# Patient Record
Sex: Female | Born: 1975 | Race: Black or African American | Hispanic: No | Smoking: Former smoker
Health system: Southern US, Community
[De-identification: ages and names within clinical notes are randomized; demographics above are authoritative.]

## PROBLEM LIST (undated history)

## (undated) DIAGNOSIS — K219 Gastro-esophageal reflux disease without esophagitis: Secondary | ICD-10-CM

## (undated) DIAGNOSIS — Z9889 Other specified postprocedural states: Secondary | ICD-10-CM

## (undated) DIAGNOSIS — A048 Other specified bacterial intestinal infections: Secondary | ICD-10-CM

## (undated) DIAGNOSIS — M359 Systemic involvement of connective tissue, unspecified: Secondary | ICD-10-CM

## (undated) DIAGNOSIS — IMO0002 Reserved for concepts with insufficient information to code with codable children: Secondary | ICD-10-CM

## (undated) DIAGNOSIS — M329 Systemic lupus erythematosus, unspecified: Secondary | ICD-10-CM

## (undated) DIAGNOSIS — E2839 Other primary ovarian failure: Secondary | ICD-10-CM

## (undated) HISTORY — DX: Other specified postprocedural states: Z98.890

## (undated) HISTORY — PX: CHOLECYSTECTOMY: SHX55

## (undated) HISTORY — DX: Other specified bacterial intestinal infections: A04.8

## (undated) HISTORY — PX: APPENDECTOMY: SHX54

---

## 2004-12-20 DIAGNOSIS — Z9889 Other specified postprocedural states: Secondary | ICD-10-CM

## 2004-12-20 HISTORY — DX: Other specified postprocedural states: Z98.890

## 2007-01-03 ENCOUNTER — Emergency Department (HOSPITAL_COMMUNITY): Admission: EM | Admit: 2007-01-03 | Discharge: 2007-01-04 | Payer: Self-pay | Admitting: Emergency Medicine

## 2007-12-01 ENCOUNTER — Emergency Department (HOSPITAL_COMMUNITY): Admission: EM | Admit: 2007-12-01 | Discharge: 2007-12-01 | Payer: Self-pay | Admitting: *Deleted

## 2008-10-13 ENCOUNTER — Emergency Department (HOSPITAL_COMMUNITY): Admission: EM | Admit: 2008-10-13 | Discharge: 2008-10-13 | Payer: Self-pay | Admitting: Family Medicine

## 2008-12-20 HISTORY — PX: OVARIAN CYST REMOVAL: SHX89

## 2009-01-27 ENCOUNTER — Ambulatory Visit (HOSPITAL_COMMUNITY): Admission: RE | Admit: 2009-01-27 | Discharge: 2009-01-27 | Payer: Self-pay | Admitting: Obstetrics and Gynecology

## 2009-12-29 ENCOUNTER — Emergency Department (HOSPITAL_COMMUNITY): Admission: EM | Admit: 2009-12-29 | Discharge: 2009-12-29 | Payer: Self-pay | Admitting: Emergency Medicine

## 2010-07-23 ENCOUNTER — Emergency Department (HOSPITAL_COMMUNITY): Admission: EM | Admit: 2010-07-23 | Discharge: 2010-07-23 | Payer: Self-pay | Admitting: Emergency Medicine

## 2010-07-25 ENCOUNTER — Emergency Department (HOSPITAL_COMMUNITY): Admission: EM | Admit: 2010-07-25 | Discharge: 2010-07-25 | Payer: Self-pay | Admitting: Emergency Medicine

## 2010-12-21 ENCOUNTER — Emergency Department (HOSPITAL_COMMUNITY)
Admission: EM | Admit: 2010-12-21 | Discharge: 2010-12-21 | Payer: Self-pay | Source: Home / Self Care | Admitting: Emergency Medicine

## 2011-03-13 ENCOUNTER — Emergency Department (HOSPITAL_COMMUNITY)
Admission: EM | Admit: 2011-03-13 | Discharge: 2011-03-13 | Disposition: A | Discharge status: Home / Self Care | Payer: BC Managed Care – PPO | Attending: Emergency Medicine | Admitting: Emergency Medicine

## 2011-03-13 DIAGNOSIS — B029 Zoster without complications: Secondary | ICD-10-CM | POA: Insufficient documentation

## 2011-03-13 DIAGNOSIS — R21 Rash and other nonspecific skin eruption: Secondary | ICD-10-CM | POA: Insufficient documentation

## 2011-03-14 ENCOUNTER — Emergency Department (HOSPITAL_COMMUNITY)
Admission: EM | Admit: 2011-03-14 | Discharge: 2011-03-14 | Disposition: A | Discharge status: Home / Self Care | Payer: BC Managed Care – PPO | Attending: Emergency Medicine | Admitting: Emergency Medicine

## 2011-03-14 DIAGNOSIS — B029 Zoster without complications: Secondary | ICD-10-CM | POA: Insufficient documentation

## 2011-03-15 ENCOUNTER — Emergency Department (HOSPITAL_COMMUNITY)
Admission: EM | Admit: 2011-03-15 | Discharge: 2011-03-15 | Disposition: A | Discharge status: Home / Self Care | Payer: BC Managed Care – PPO | Attending: Emergency Medicine | Admitting: Emergency Medicine

## 2011-03-15 DIAGNOSIS — N898 Other specified noninflammatory disorders of vagina: Secondary | ICD-10-CM | POA: Insufficient documentation

## 2011-03-15 DIAGNOSIS — B9689 Other specified bacterial agents as the cause of diseases classified elsewhere: Secondary | ICD-10-CM | POA: Insufficient documentation

## 2011-03-15 DIAGNOSIS — Z79899 Other long term (current) drug therapy: Secondary | ICD-10-CM | POA: Insufficient documentation

## 2011-03-15 DIAGNOSIS — N760 Acute vaginitis: Secondary | ICD-10-CM | POA: Insufficient documentation

## 2011-03-15 DIAGNOSIS — A499 Bacterial infection, unspecified: Secondary | ICD-10-CM | POA: Insufficient documentation

## 2011-03-15 LAB — URINALYSIS, ROUTINE W REFLEX MICROSCOPIC
Glucose, UA: NEGATIVE mg/dL
Ketones, ur: NEGATIVE mg/dL
Leukocytes, UA: NEGATIVE
Urobilinogen, UA: 1 mg/dL (ref 0.0–1.0)
pH: 6 (ref 5.0–8.0)

## 2011-03-15 LAB — URINE MICROSCOPIC-ADD ON

## 2011-03-15 LAB — WET PREP, GENITAL: Yeast Wet Prep HPF POC: NONE SEEN

## 2011-03-15 LAB — POCT PREGNANCY, URINE: Preg Test, Ur: NEGATIVE

## 2011-03-18 ENCOUNTER — Other Ambulatory Visit: Payer: Self-pay | Admitting: Family Medicine

## 2011-03-18 ENCOUNTER — Ambulatory Visit
Admission: RE | Admit: 2011-03-18 | Discharge: 2011-03-18 | Disposition: A | Discharge status: Home / Self Care | Payer: BC Managed Care – PPO | Source: Ambulatory Visit | Attending: Family Medicine | Admitting: Family Medicine

## 2011-03-18 DIAGNOSIS — Z720 Tobacco use: Secondary | ICD-10-CM

## 2011-03-23 ENCOUNTER — Ambulatory Visit: Payer: BC Managed Care – PPO | Admitting: Gynecology

## 2011-04-06 LAB — DIFFERENTIAL
Eosinophils Absolute: 0.1 10*3/uL (ref 0.0–0.7)
Eosinophils Relative: 1 % (ref 0–5)
Neutro Abs: 3.1 10*3/uL (ref 1.7–7.7)

## 2011-04-06 LAB — CBC
RDW: 11.6 % (ref 11.5–15.5)
WBC: 5.1 10*3/uL (ref 4.0–10.5)

## 2011-05-04 NOTE — Op Note (Signed)
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   PROCEDURE:  Laparoscopy, lysis of adhesions, extensive enterolysis,  ablation of right adnexal endometriosis.   SURGEON:  Fermin Schwab, MD   ANESTHESIA:  General endotracheal.   FINDINGS:  On exam under anesthesia, external genitalia, Bartholin's,  Skene's, urethra were normal.  Cervix was grossly normal.  Uterus was  anteverted, normal size, and somewhat mobile.  No adnexal masses or cul-  de-sac.  Induration or nodularity was palpable.  Laparoscopy, inspection  of the upper abdomen shows omental adhesions to the left upper quadrant.  Diaphragm surfaces were normal.  The liver edge was adherent to the  entire abdominal wall, status post laparoscopic cholecystectomy,  gallbladder was surgically absent.  There were dense small bowel  adhesions infraumbilically as midline.  I passed this section, below  this there were dense adhesions between the sigmoid and midline  abdominal wall.  Loops of bowel were also adherent to this segment of  the sigmoid colon.  Both iliac fossae were completely covered with small  bowel adhesions obscuring view of the pelvis.   After layers of adhesiolysis on loops of bowel, more adhesions were  found to the fundus both anteriorly and posteriorly and to the bilateral  adnexa.   After this layer of small bowel adhesions were taken down, it was noted  that a portion of the posterior cul-de-sac was  spared from further  obliteration with small bowel adhesions.  Once we entered the peritoneal  tent, the portions of the ovary and distal ends of the tubes could be  viewed.   After freeing up the small bowel adhesions to the true pelvis, it was  noted that the left tube was edematous and adherent to the pelvic  sidewall and to the left ovary, which itself was densely adherent to the  pelvic sidewall.  The fimbriae were rated as 3/5 although the tube could  not be directed into the fallopian tube on the left.  The right ovary  was similarly adherent to the pelvic sidewall with dense adhesions,  which contained a 1-cm endometrioma.  There was also a small powder burn  lesion in the periovarian dense adhesions.  The right tube also looked  edematous, although the fimbriated ends with 3/5 fimbria was visible.   DESCRIPTION OF PROCEDURE:  The patient was placed in lithotomy position.  Kefzol 1 g was given intravenously for prophylaxis.  General  endotracheal anesthesia was given and the patient was placed in  lithotomy position.  The patient's abdomen and perineum and vagina were  prepped with Betadine, the patient was draped  in a sterile manner.  A  Foley catheter was inserted.  A cervix was grasped with a tenaculum and  it sounded to 8.75 cm.  A ZUMI uterine manipulator was placed and its  balloon was inflated with 3 mL of saline.  This was used throughout the  case for uterine manipulation.  The surgeon was re-gloved and an  operative field was created on the patient's abdomen.  Because of the  midline lower abdominal surgical scar and supraumbilical laparoscopy  scar, the decision was made to enter the left upper quadrant incision.  After preemptive anesthesia of 0.25% bupivacaine with 1:200,000  epinephrine, a 5 mm incision was made at the junction of costal margin  with inferior axillary line.  A Veress needle was inserted and  pneumoperitoneum was created with carbon dioxide after  its correct  location was verified.  Next, a 5-mm trocar was placed and video  laparoscopy was started with 30-degree laparoscope.  Above findings were  noted.  Under direct visualization, an infraumbilical incision was made  and a 10-mm trocar was inserted and video laparoscopy was resumed  through this side.  A third puncture was made in the right lower  quadrant under direct visualization.  Ancillary instruments were used  through the lateral ports throughout the case.  Using careful needle  point electrosurgery in cutting mode (35 watts) the small bowel  adhesions were taken down, the abdominal wall being away from the  bladder muscularis.  The sigmoid enterolysis was not performed because  of the density of the scar tissue.  The patient is in Trendelenburg  position, layers and layers of small bowel were taken down using the  same energy source and extreme care.  Gentle blunt dissection was also  used.  Finally, fundus was visualized and further adhesiolysis was  carried out until the posterior cul-de-sac was completely freed up.  A  dense small bowel adhesion to the left adnexal region was left behind.  Although we tried to free up the parafimbrial adhesions, this was  abandoned because of the density of the scarring.   Approximately 45 minutes of enterolysis had to be performed, which was  necessary to get to the area of interest, the pelvic organs.  There was  a left periovarian peritoneal loculation, which was opened.  Similarly,  the endometriotic lesion on the right ovary was ablated, and small right  ovarian cyst was ablated with needle electrodes.  No significant  oophorolysis could be performed on the right because of the density of  the adhesions.   Chromotubation showed spillage from the right tube, but the left tube  did not spill.   The segments of small bowel involved in enterolysis were carefully  inspected.  Bowel integrity was confirmed and hemostasis was  checked.  A  slurry made from 2 sheets of Seprafilm and 40 mL of lactated Ringer's  was instilled into the pelvis for adhesion prevention.  There is no  specimen for pathology.  Instruments were removed, the lap pad count was  correct.  The gas was allowed to escape.  The incisions were  approximated with Dermabond.  Estimated blood loss was 50 mL.  The  patient tolerated the procedure well and was transferred to recovery  room in satisfactory condition.      Fermin Schwab, MD  Electronically Signed     TY/MEDQ  D:  01/28/2009  T:  01/28/2009  Job:  [id number]   cc:   Eustaquio Boyden, MD

## 2011-06-22 ENCOUNTER — Emergency Department (HOSPITAL_COMMUNITY)
Admission: EM | Admit: 2011-06-22 | Discharge: 2011-06-22 | Discharge status: Against Medical Advice | Payer: Self-pay | Attending: Emergency Medicine | Admitting: Emergency Medicine

## 2011-06-22 DIAGNOSIS — R109 Unspecified abdominal pain: Secondary | ICD-10-CM | POA: Insufficient documentation

## 2011-06-23 ENCOUNTER — Emergency Department (HOSPITAL_COMMUNITY)
Admission: EM | Admit: 2011-06-23 | Discharge: 2011-06-23 | Disposition: A | Discharge status: Home / Self Care | Payer: Self-pay | Attending: Emergency Medicine | Admitting: Emergency Medicine

## 2011-06-23 DIAGNOSIS — R110 Nausea: Secondary | ICD-10-CM | POA: Insufficient documentation

## 2011-06-23 DIAGNOSIS — R1013 Epigastric pain: Secondary | ICD-10-CM | POA: Insufficient documentation

## 2011-06-23 LAB — URINALYSIS, ROUTINE W REFLEX MICROSCOPIC
Bilirubin Urine: NEGATIVE
Glucose, UA: NEGATIVE mg/dL
Hgb urine dipstick: NEGATIVE
Ketones, ur: NEGATIVE mg/dL
Leukocytes, UA: NEGATIVE
Nitrite: NEGATIVE
Specific Gravity, Urine: 1.015 (ref 1.005–1.030)
pH: 7 (ref 5.0–8.0)

## 2011-06-24 ENCOUNTER — Emergency Department (HOSPITAL_COMMUNITY)
Admission: EM | Admit: 2011-06-24 | Discharge: 2011-06-24 | Disposition: A | Discharge status: Home / Self Care | Payer: Self-pay | Attending: Emergency Medicine | Admitting: Emergency Medicine

## 2011-06-24 DIAGNOSIS — R1013 Epigastric pain: Secondary | ICD-10-CM | POA: Insufficient documentation

## 2011-06-25 ENCOUNTER — Emergency Department (HOSPITAL_COMMUNITY)
Admission: EM | Admit: 2011-06-25 | Discharge: 2011-06-25 | Disposition: A | Discharge status: Home / Self Care | Payer: Self-pay | Attending: Emergency Medicine | Admitting: Emergency Medicine

## 2011-06-25 ENCOUNTER — Emergency Department (HOSPITAL_COMMUNITY): Payer: Self-pay

## 2011-06-25 DIAGNOSIS — R1013 Epigastric pain: Secondary | ICD-10-CM | POA: Insufficient documentation

## 2011-06-25 DIAGNOSIS — Z79899 Other long term (current) drug therapy: Secondary | ICD-10-CM | POA: Insufficient documentation

## 2011-06-25 DIAGNOSIS — R05 Cough: Secondary | ICD-10-CM | POA: Insufficient documentation

## 2011-06-25 DIAGNOSIS — R079 Chest pain, unspecified: Secondary | ICD-10-CM | POA: Insufficient documentation

## 2011-06-25 DIAGNOSIS — R0602 Shortness of breath: Secondary | ICD-10-CM | POA: Insufficient documentation

## 2011-06-25 DIAGNOSIS — K219 Gastro-esophageal reflux disease without esophagitis: Secondary | ICD-10-CM | POA: Insufficient documentation

## 2011-06-25 DIAGNOSIS — R059 Cough, unspecified: Secondary | ICD-10-CM | POA: Insufficient documentation

## 2011-06-25 LAB — DIFFERENTIAL
Eosinophils Absolute: 0.2 10*3/uL (ref 0.0–0.7)
Lymphocytes Relative: 32 % (ref 12–46)
Monocytes Absolute: 0.3 10*3/uL (ref 0.1–1.0)
Monocytes Relative: 8 % (ref 3–12)
Neutro Abs: 2.6 10*3/uL (ref 1.7–7.7)
Neutrophils Relative %: 56 % (ref 43–77)

## 2011-06-25 LAB — POCT I-STAT, CHEM 8
BUN: 7 mg/dL (ref 6–23)
Calcium, Ion: 1.12 mmol/L (ref 1.12–1.32)
Chloride: 103 mEq/L (ref 96–112)
Creatinine, Ser: 0.8 mg/dL (ref 0.50–1.10)
Glucose, Bld: 96 mg/dL (ref 70–99)
Hemoglobin: 13.3 g/dL (ref 12.0–15.0)
Sodium: 138 mEq/L (ref 135–145)

## 2011-06-25 LAB — CBC
MCH: 35.9 pg — ABNORMAL HIGH (ref 26.0–34.0)
MCHC: 35.8 g/dL (ref 30.0–36.0)
RDW: 11.1 % — ABNORMAL LOW (ref 11.5–15.5)
WBC: 4.5 10*3/uL (ref 4.0–10.5)

## 2011-06-25 MED ORDER — IOHEXOL 300 MG/ML  SOLN
80.0000 mL | Freq: Once | INTRAMUSCULAR | Status: AC | PRN
  Administered 2011-06-25: 80 mL via INTRAVENOUS

## 2011-06-29 ENCOUNTER — Ambulatory Visit: Payer: Self-pay | Admitting: Gastroenterology

## 2011-06-30 ENCOUNTER — Ambulatory Visit: Payer: Self-pay | Admitting: Gastroenterology

## 2011-06-30 ENCOUNTER — Encounter: Payer: Self-pay | Admitting: Urgent Care

## 2011-06-30 ENCOUNTER — Ambulatory Visit (INDEPENDENT_AMBULATORY_CARE_PROVIDER_SITE_OTHER): Payer: Self-pay | Admitting: Urgent Care

## 2011-06-30 VITALS — BP 118/74 | HR 99 | Temp 97.3°F | Ht 66.0 in | Wt 179.2 lb

## 2011-06-30 DIAGNOSIS — Z860100 Personal history of colon polyps, unspecified: Secondary | ICD-10-CM

## 2011-06-30 DIAGNOSIS — K219 Gastro-esophageal reflux disease without esophagitis: Secondary | ICD-10-CM

## 2011-06-30 MED ORDER — DEXLANSOPRAZOLE 60 MG PO CPDR: 60.0000 mg | DELAYED_RELEASE_CAPSULE | Freq: Every day | ORAL | Status: DC

## 2011-06-30 NOTE — Patient Instructions (Signed)
Stop zantac Stop carafate the day before the EGD Start Dexilant 60mg  daily

## 2011-06-30 NOTE — Assessment & Plan Note (Signed)
Polypectomy w/ colonoscopy in [place] 2006.  We will attempt to get old records to determine timing of next colonoscopy.  Pt cannot remember MD name, but is going to investigate further & call us back.

## 2011-06-30 NOTE — Assessment & Plan Note (Addendum)
Kim Fitzgerald is a 35 y.o. black female w/ refractory GERD, anorexia & abdominal pain.  Hx h pylori infection & reports treatment twice.  She is going to require further evaluation via EGD to r/o PUD, h pylori gastritis, & erosive esophagitis.  I have discussed risks & benefits which include, but are not limited to, bleeding, infection, perforation & drug reaction.  The patient agrees with this plan & written consent will be obtained.    Stop zantac Stop carafate the day before the EGD Start Dexilant 60mg  daily (2 boxes samples given)

## 2011-06-30 NOTE — Progress Notes (Signed)
Primary Care Physician:  Evlyn Courier, MD Primary Gastroenterologist:  Dr. Darrick Penna  Chief Complaint  Patient presents with  . Gastrophageal Reflux    HPI:  Kim Fitzgerald is a 35 y.o. female here as a new patient for refractory GERD.   Approx 12 days ago, c/o mid-abd burning.  Went to ER & started pt on prevpac that she has now completed.  Burning worse 2 days later, then started carafate...sems to help.  C/o "throat on fire" awakening at night.  Denies dysphagia or odynophagia.  C/o  PND.  C/o anorexia.  Nausea, no vomiting.  Denies melena or rectal bleeding.  Wt stable.  Drinking 6 pk Ensure daily because she can't eat.  Felt like she couldn't "catch her breath" the morning she went to ER.  Has been to ER x3.  D-dimer was positive, but CT angiogram chest normal.  Urine & U preg negative.  Tried prilosec 20mg  daily--no help.  Tried zantac 150mg  daily--no help.  Tried alovera juice, baking soda, vinegar & water--seems to help.  Denies any chest pain or palpitations.  No SOB since ER visit.  BM daily was previosly had chronic constipation.  Denies diarrhea. 06/25/11 labs: Na 138  135 - 145 mEq/L  Final   Potassium  4.1  3.5 - 5.1 mEq/L  Final   Chloride  103  96 - 112 mEq/L  Final   BUN  7  6 - 23 mg/dL  Final   Creatinine, Ser  0.80  0.50 - 1.10 mg/dL  Final   Glucose, Bld  96  70 - 99 mg/dL  Final   Calcium, Ion  1.12  1.12 - 1.32 mmol/L  Final   TCO2  23  0 - 100 mmol/L  Final   Hemoglobin  13.3  12.0 - 15.0 g/dL  Final   HCT  04.5  40.9 - 46.0 %  Final   Past Medical History  Diagnosis Date  . H. pylori infection      treated twice  . S/P colonoscopy 2006    [place]-[facility] w/Lukasik(PAC), 1 polyp-benign   Past Surgical History  Procedure Date  . Appendectomy   . Cholecystectomy     cholelithiasis   Current Outpatient Prescriptions  Medication Sig Dispense Refill  . ibuprofen (ADVIL,MOTRIN) 200 MG tablet Take 200 mg by mouth every 6 (six) hours as needed. rare       .  oxyCODONE-acetaminophen (PERCOCET) 5-325 MG per tablet Take 1 tablet by mouth every 4 (four) hours as needed.        . pregabalin (LYRICA) 75 MG capsule Take 75 mg by mouth 1 day or 1 dose.        . ranitidine (ZANTAC) 150 MG tablet Take 150 mg by mouth daily.        . sucralfate (CARAFATE) 1 G tablet Take 1 g by mouth 4 (four) times daily.         Allergies as of 06/30/2011  . (No Known Allergies)   Family History:There is no known family history of colorectal carcinoma.  Problem Relation Age of Onset  . Crohn's disease Brother 29  . Alcohol abuse Father   . Heart failure Mother    History   Social History  . Marital Status: Single    Spouse Name: N/A    Number of Children: 0  . Years of Education: N/A   Occupational History  . property mgmt    Social History Main Topics  . Smoking status: Current Everyday  Smoker -- 1.0 packs/day for 12 years    Types: Cigarettes  . Smokeless tobacco: Not on file  . Alcohol Use: No  . Drug Use: Not on file  . Sexually Active: Not Currently -- Female partner(s)   Other Topics Concern  . Not on file   Social History Narrative   Lives alone   Review of Systems: Gen: c/o fatigue & malaise.   CV:See HPI Resp: See HPI GI:  Denies vomiting blood, jaundice, and fecal incontinence. GU : Denies urinary burning, blood in urine, urinary frequency, urinary hesitancy, nocturnal urination, and urinary incontinence. MS: Denies joint pain, limitation of movement, and swelling, stiffness, low back pain, extremity pain. Denies muscle weakness, cramps, atrophy.  Derm: Denies rash, itching, dry skin, hives, moles, warts, or unhealing ulcers.  Psych: Denies depression, anxiety, memory loss, suicidal ideation, hallucinations, paranoia, and confusion. Heme: Denies bruising, bleeding, and enlarged lymph nodes.  Physical Exam: BP 118/74  Pulse 99  Temp(Src) 97.3 F (36.3 C) (Tympanic)  Ht 5\' 6"  (1.676 m)  Wt 179 lb 3.2 oz (81.285 kg)  BMI 28.92 kg/m2   LMP 06/11/2011 General:   Alert,  Well-developed, well-nourished, pleasant and cooperative in NAD Head:  Normocephalic and atraumatic. Eyes:  Sclera clear, no icterus.   Conjunctiva pink. Ears:  Normal auditory acuity. Nose:  No deformity, discharge,  or lesions. Mouth:  No deformity or lesions, dentition normal. Neck:  Supple; no masses or thyromegaly. Lungs:  Clear throughout to auscultation.   No wheezes, crackles, or rhonchi. No acute distress. Heart:  Regular rate and rhythm; no murmurs, clicks, rubs,  or gallops. Abdomen:  Large lower abd midline scar healed c/w previous appendectomy.  RUQ scars c/w cholecystectomy well-healed.  Soft, nontender and nondistended. No masses, hepatosplenomegaly or hernias noted. Normal bowel sounds, without guarding, and without rebound.   Rectal:  Deferred until time of colonoscopy.   Msk:  Symmetrical without gross deformities. Normal posture. Pulses:  Normal pulses noted. Extremities:  Without clubbing or edema. Neurologic:  Alert and  oriented x4;  grossly normal neurologically. Skin:  Intact without significant lesions or rashes. Cervical Nodes:  No significant cervical adenopathy. Psych:  Alert and cooperative. Normal mood and affect.

## 2011-06-30 NOTE — Progress Notes (Signed)
Cc to PCP 

## 2011-07-01 ENCOUNTER — Ambulatory Visit: Payer: Self-pay | Admitting: Gastroenterology

## 2011-07-01 ENCOUNTER — Encounter (HOSPITAL_COMMUNITY): Payer: Self-pay | Admitting: *Deleted

## 2011-07-01 ENCOUNTER — Emergency Department (HOSPITAL_COMMUNITY)
Admission: EM | Admit: 2011-07-01 | Discharge: 2011-07-01 | Disposition: A | Discharge status: Home / Self Care | Payer: Self-pay | Attending: Emergency Medicine | Admitting: Emergency Medicine

## 2011-07-01 ENCOUNTER — Emergency Department (HOSPITAL_COMMUNITY): Payer: Self-pay

## 2011-07-01 DIAGNOSIS — R350 Frequency of micturition: Secondary | ICD-10-CM | POA: Insufficient documentation

## 2011-07-01 DIAGNOSIS — K5900 Constipation, unspecified: Secondary | ICD-10-CM | POA: Insufficient documentation

## 2011-07-01 DIAGNOSIS — R42 Dizziness and giddiness: Secondary | ICD-10-CM | POA: Insufficient documentation

## 2011-07-01 DIAGNOSIS — F17200 Nicotine dependence, unspecified, uncomplicated: Secondary | ICD-10-CM | POA: Insufficient documentation

## 2011-07-01 DIAGNOSIS — R5383 Other fatigue: Secondary | ICD-10-CM | POA: Insufficient documentation

## 2011-07-01 DIAGNOSIS — R5381 Other malaise: Secondary | ICD-10-CM | POA: Insufficient documentation

## 2011-07-01 DIAGNOSIS — R110 Nausea: Secondary | ICD-10-CM | POA: Insufficient documentation

## 2011-07-01 LAB — BASIC METABOLIC PANEL
BUN: 13 mg/dL (ref 6–23)
CO2: 31 mEq/L (ref 19–32)
Calcium: 9.7 mg/dL (ref 8.4–10.5)
Creatinine, Ser: 0.6 mg/dL (ref 0.50–1.10)
GFR calc non Af Amer: >60 mL/min (ref >60)
Glucose, Bld: 105 mg/dL — ABNORMAL HIGH (ref 70–99)

## 2011-07-01 LAB — DIFFERENTIAL
Eosinophils Absolute: 0.1 10*3/uL (ref 0.0–0.7)
Eosinophils Relative: 2 % (ref 0–5)
Lymphocytes Relative: 28 % (ref 12–46)
Lymphs Abs: 1.8 10*3/uL (ref 0.7–4.0)
Monocytes Absolute: 0.3 10*3/uL (ref 0.1–1.0)
Monocytes Relative: 5 % (ref 3–12)

## 2011-07-01 LAB — CBC
HCT: 32.2 % — ABNORMAL LOW (ref 36.0–46.0)
MCH: 38.7 pg — ABNORMAL HIGH (ref 26.0–34.0)
MCV: 105.6 fL — ABNORMAL HIGH (ref 78.0–100.0)
Platelets: 154 10*3/uL (ref 150–400)
RBC: 3.05 MIL/uL — ABNORMAL LOW (ref 3.87–5.11)
WBC: 6.4 10*3/uL (ref 4.0–10.5)

## 2011-07-01 MED ORDER — DOCUSATE SODIUM 100 MG PO CAPS: 100.0000 mg | ORAL_CAPSULE | Freq: Two times a day (BID) | ORAL | Status: AC

## 2011-07-01 MED ORDER — DOCUSATE SODIUM 100 MG PO CAPS: 100.0000 mg | ORAL_CAPSULE | Freq: Two times a day (BID) | ORAL | Status: DC

## 2011-07-01 MED ORDER — SODIUM CHLORIDE 0.9 % IV SOLN
Freq: Once | INTRAVENOUS | Status: AC
  Administered 2011-07-01: 17:00:00 via INTRAVENOUS

## 2011-07-01 MED ORDER — ONDANSETRON HCL 4 MG/2ML IJ SOLN
4.0000 mg | Freq: Once | INTRAMUSCULAR | Status: AC
  Administered 2011-07-01: 4 mg via INTRAVENOUS
  Filled 2011-07-01: qty 2

## 2011-07-01 NOTE — ED Notes (Signed)
edpa in to eval

## 2011-07-01 NOTE — ED Provider Notes (Addendum)
History     Chief Complaint  Patient presents with  . Fatigue  . Constipation   Patient is a 35 y.o. female presenting with constipation. The history is provided by the patient. No language interpreter was used.  Constipation  The current episode started more than 2 weeks ago. The onset was gradual. The problem has been gradually worsening. The patient is experiencing no pain. The stool is described as soft. There was no prior successful therapy. Ineffective treatments: dr. Darrick Penna put her on dexilant without improvement.  Associated symptoms include nausea. Pertinent negatives include no anorexia, no abdominal pain, no diarrhea and no vomiting. Her past medical history is significant for abdominal surgery. Her past medical history does not include Hirschsprung's disease, inflammatory bowel disease or recent abdominal injury.   Seen by dr. Darrick Penna.  States she can't eat b/c she has no appetite.  She is drinking ~ 6 bottles of ensure daily.  She mildly dizzy with standing.  She is to have an upper endoscopy on 07-16-11 by dr. Darrick Penna. Past Medical History  Diagnosis Date  . H. pylori infection      treated twice  . S/P colonoscopy 2006    [place]-[facility] w/Lukasik(PAC), 1 polyp-benign    Past Surgical History  Procedure Date  . Appendectomy   . Cholecystectomy     cholelithiasis    Family History  Problem Relation Age of Onset  . Crohn's disease Brother 4  . Alcohol abuse Father   . Heart failure Mother     History  Substance Use Topics  . Smoking status: Current Everyday Smoker -- 1.0 packs/day for 12 years    Types: Cigarettes  . Smokeless tobacco: Not on file  . Alcohol Use: No    OB History    Grav Para Term Preterm Abortions TAB SAB Ect Mult Living                  Review of Systems  Constitutional: Positive for fatigue.  Gastrointestinal: Positive for nausea and constipation. Negative for vomiting, abdominal pain, diarrhea, abdominal distention and anorexia.    Genitourinary: Positive for frequency.  Neurological: Positive for light-headedness.  All other systems reviewed and are negative.    Physical Exam  BP 126/70  Pulse 110  Temp(Src) 98.6 F (37 C) (Oral)  Resp 18  Ht 5\' 6"  (1.676 m)  Wt 175 lb (79.379 kg)  BMI 28.25 kg/m2  SpO2 100%  LMP 06/27/2011  Physical Exam  Constitutional: She appears well-developed. No distress.  HENT:  Head: Normocephalic and atraumatic.  Eyes: EOM are normal.  Neck: Normal range of motion. Neck supple.  Pulmonary/Chest: Effort normal and breath sounds normal. No respiratory distress. She has no wheezes. She has no rales.  Abdominal: Soft. Bowel sounds are normal. She exhibits no mass. There is tenderness. There is no rebound and no guarding.  Skin: She is not diaphoretic.    ED Course  Procedures  MDM Pt appears to be minimally uncomfortable. Explained lab and x-ray results to pt and spouse.   Medical screening examination/treatment/procedure(s) were conducted as a shared visit with non-physician practitioner(s) and myself.  I personally evaluated the patient during the encounter.   No acute abdomen  Worthy Rancher, PA 07/01/11 1632  Worthy Rancher, PA 07/01/11 1945  Worthy Rancher, PA 07/19/11 1657  Donnetta Hutching, MD 07/28/11 Aldona Lento  Donnetta Hutching, MD 07/30/11 [id number]

## 2011-07-01 NOTE — ED Notes (Signed)
C/o increased weakness, generalized, and constipation x 2 days with nausea.  Denies v/d.

## 2011-07-01 NOTE — ED Notes (Signed)
Pt states she is constipated. States she has been having issues with her bowels for a long while. States her bowels movements have been irregular for a long time. Last real bm was Sunday. Is to be scoped in a couple of weeks by dr fields

## 2011-07-15 MED ORDER — SODIUM CHLORIDE 0.45 % IV SOLN
Freq: Once | INTRAVENOUS | Status: AC
  Administered 2011-07-16: 1000 mL via INTRAVENOUS

## 2011-07-15 NOTE — Progress Notes (Signed)
AGREE

## 2011-07-16 ENCOUNTER — Ambulatory Visit (HOSPITAL_COMMUNITY)
Admission: RE | Admit: 2011-07-16 | Discharge: 2011-07-16 | Disposition: A | Discharge status: Home / Self Care | Payer: Self-pay | Source: Ambulatory Visit | Attending: Gastroenterology | Admitting: Gastroenterology

## 2011-07-16 ENCOUNTER — Encounter (HOSPITAL_COMMUNITY): Payer: Self-pay | Admitting: *Deleted

## 2011-07-16 ENCOUNTER — Encounter: Payer: Self-pay | Admitting: Gastroenterology

## 2011-07-16 ENCOUNTER — Other Ambulatory Visit: Payer: Self-pay | Admitting: Gastroenterology

## 2011-07-16 ENCOUNTER — Encounter (HOSPITAL_COMMUNITY)
Admission: RE | Disposition: A | Discharge status: Home / Self Care | Payer: Self-pay | Source: Ambulatory Visit | Attending: Gastroenterology

## 2011-07-16 DIAGNOSIS — R110 Nausea: Secondary | ICD-10-CM | POA: Insufficient documentation

## 2011-07-16 DIAGNOSIS — R630 Anorexia: Secondary | ICD-10-CM | POA: Insufficient documentation

## 2011-07-16 DIAGNOSIS — R1013 Epigastric pain: Secondary | ICD-10-CM

## 2011-07-16 DIAGNOSIS — K219 Gastro-esophageal reflux disease without esophagitis: Secondary | ICD-10-CM | POA: Insufficient documentation

## 2011-07-16 DIAGNOSIS — K2970 Gastritis, unspecified, without bleeding: Secondary | ICD-10-CM

## 2011-07-16 DIAGNOSIS — K2990 Gastroduodenitis, unspecified, without bleeding: Secondary | ICD-10-CM

## 2011-07-16 DIAGNOSIS — K2900 Acute gastritis without bleeding: Secondary | ICD-10-CM | POA: Insufficient documentation

## 2011-07-16 HISTORY — PX: ESOPHAGOGASTRODUODENOSCOPY: SHX5428

## 2011-07-16 HISTORY — DX: Gastro-esophageal reflux disease without esophagitis: K21.9

## 2011-07-16 SURGERY — EGD (ESOPHAGOGASTRODUODENOSCOPY)
Anesthesia: Moderate Sedation

## 2011-07-16 MED ORDER — SIMETHICONE 40 MG/0.6ML PO SUSP
ORAL | Status: DC | PRN
  Administered 2011-07-16: 11:00:00

## 2011-07-16 MED ORDER — MEPERIDINE HCL 100 MG/ML IJ SOLN
INTRAMUSCULAR | Status: DC | PRN
  Administered 2011-07-16: 25 mg via INTRAVENOUS
  Administered 2011-07-16: 50 mg via INTRAVENOUS

## 2011-07-16 MED ORDER — BUTAMBEN-TETRACAINE-BENZOCAINE 2-2-14 % EX AERO
INHALATION_SPRAY | CUTANEOUS | Status: DC | PRN
  Administered 2011-07-16: 2 via TOPICAL

## 2011-07-16 MED ORDER — PROMETHAZINE HCL 25 MG/ML IJ SOLN
INTRAMUSCULAR | Status: AC
  Filled 2011-07-16: qty 1

## 2011-07-16 MED ORDER — MEPERIDINE HCL 100 MG/ML IJ SOLN
INTRAMUSCULAR | Status: AC
  Filled 2011-07-16: qty 2

## 2011-07-16 MED ORDER — MIDAZOLAM HCL 5 MG/5ML IJ SOLN
INTRAMUSCULAR | Status: AC
  Filled 2011-07-16: qty 10

## 2011-07-16 MED ORDER — MIDAZOLAM HCL 5 MG/5ML IJ SOLN
INTRAMUSCULAR | Status: DC | PRN
  Administered 2011-07-16: 2 mg via INTRAVENOUS
  Administered 2011-07-16: 1 mg via INTRAVENOUS

## 2011-07-16 NOTE — Interval H&P Note (Signed)
History and Physical Interval Note:   07/16/2011   10:34 AM   Kim Fitzgerald  has presented today for surgery, with the diagnosis of gerd  The various methods of treatment have been discussed with the patient and family. After consideration of risks, benefits and other options for treatment, the patient has consented to  Procedure(s): ESOPHAGOGASTRODUODENOSCOPY (EGD) as a surgical intervention .  I have reviewed the patients' chart and labs.  Questions were answered to the patient's satisfaction.     Jonette Eva  MD

## 2011-07-16 NOTE — H&P (Signed)
Diagnoses     GERD (gastroesophageal reflux disease)   - Primary    530.81    History of colonic polyps     V12.72      Reason for Visit     Gastrophageal Reflux        Current Vitals       Recorded User        06/30/2011  8:25 AM  Trudee Kuster, LPN           BP Pulse Temp (Src) Resp Ht Wt    118/74  99  97.3 F (36.3 C) (Tympanic)  N/A  5\' 6"  (1.676 m)  179 lb 3.2 oz (81.285 kg)       BMI SpO2 PF LMP    28.92 kg/m2  N/A  N/A  N/A          Progress Notes     Lorenza Burton, NP  06/30/2011  9:28 AM  Signed Primary Care Physician:  Evlyn Courier, MD Primary Gastroenterologist:  Dr. Darrick Penna    Chief Complaint   Patient presents with   .  Gastrophageal Reflux      HPI:  JESSEKA DRINKARD is a 35 y.o. female here as a new patient for refractory GERD.   Approx 12 days ago, c/o mid-abd burning.  Went to ER & started pt on prevpac that she has now completed.  Burning worse 2 days later, then started carafate...sems to help.  C/o "throat on fire" awakening at night.  Denies dysphagia or odynophagia.  C/o  PND.  C/o anorexia.  Nausea, no vomiting.  Denies melena or rectal bleeding.  Wt stable. Drinking 6 pk Ensure daily because she can't eat.  Felt like she couldn't "catch her breath" the morning she went to ER.  Has been to ER x3.  D-dimer was positive, but CT angiogram chest normal.  Urine & U preg negative.  Tried prilosec 20mg  daily--no help.  Tried zantac 150mg  daily--no help.  Tried alovera juice, baking soda, vinegar & water--seems to help.  Denies any chest pain or palpitations.  No SOB since ER visit.   BM daily was previosly had chronic constipation.  Denies diarrhea. 06/25/11 labs: Na  138   135 - 145 mEq/L   Final     Potassium   4.1   3.5 - 5.1 mEq/L   Final     Chloride   103   96 - 112 mEq/L   Final     BUN   7   6 - 23 mg/dL   Final     Creatinine, Ser   0.80   0.50 - 1.10 mg/dL   Final     Glucose, Bld   96   70 - 99 mg/dL   Final     Calcium, Ion   1.12    1.12 - 1.32 mmol/L   Final     TCO2   23   0 - 100 mmol/L   Final     Hemoglobin   13.3   12.0 - 15.0 g/dL   Final     HCT   78.2   36.0 - 46.0 %   Final       Past Medical History   Diagnosis  Date   .  H. pylori infection          treated twice   .  S/P colonoscopy  2006       -[facility] w/Lukasik(PAC), 1 polyp-benign    Past  Surgical History   Procedure  Date   .  Appendectomy     .  Cholecystectomy         cholelithiasis    Current Outpatient Prescriptions   Medication  Sig  Dispense  Refill   .  ibuprofen (ADVIL,MOTRIN) 200 MG tablet  Take 200 mg by mouth every 6 (six) hours as needed. rare          .  oxyCODONE-acetaminophen (PERCOCET) 5-325 MG per tablet  Take 1 tablet by mouth every 4 (four) hours as needed.           .  pregabalin (LYRICA) 75 MG capsule  Take 75 mg by mouth 1 day or 1 dose.           .  ranitidine (ZANTAC) 150 MG tablet  Take 150 mg by mouth daily.           .  sucralfate (CARAFATE) 1 G tablet  Take 1 g by mouth 4 (four) times daily.            Allergies as of 06/30/2011   .  (No Known Allergies)    Family History:There is no known family history of colorectal carcinoma.   Problem  Relation  Age of Onset   .  Crohn's disease  Brother  22   .  Alcohol abuse  Father     .  Heart failure  Mother         History       Social History   .  Marital Status:  Single       Spouse Name:  N/A       Number of Children:  0   .  Years of Education:  N/A       Occupational History   .  property mgmt         Social History Main Topics   .  Smoking status:  Current Everyday Smoker -- 1.0 packs/day for 12 years       Types:  Cigarettes   .  Smokeless tobacco:  Not on file   .  Alcohol Use:  No   .  Drug Use:  Not on file   .  Sexually Active:  Not Currently -- Female partner(s)       Other Topics  Concern   .  Not on file       Social History Narrative     Lives alone    Review of Systems: Gen: c/o fatigue & malaise.    CV:See HPI Resp: See  HPI GI:  Denies vomiting blood, jaundice, and fecal incontinence. GU : Denies urinary burning, blood in urine, urinary frequency, urinary hesitancy, nocturnal urination, and urinary incontinence. MS: Denies joint pain, limitation of movement, and swelling, stiffness, low back pain, extremity pain. Denies muscle weakness, cramps, atrophy.   Derm: Denies rash, itching, dry skin, hives, moles, warts, or unhealing ulcers.   Psych: Denies depression, anxiety, memory loss, suicidal ideation, hallucinations, paranoia, and confusion. Heme: Denies bruising, bleeding, and enlarged lymph nodes.   Physical Exam: BP 118/74  Pulse 99  Temp(Src) 97.3 F (36.3 C) (Tympanic)  Ht 5\' 6"  (1.676 m)  Wt 179 lb 3.2 oz (81.285 kg)  BMI 28.92 kg/m2  LMP 06/11/2011 General:   Alert,  Well-developed, well-nourished, pleasant and cooperative in NAD Head:  Normocephalic and atraumatic. Eyes:  Sclera clear, no icterus.   Conjunctiva pink. Ears:  Normal auditory acuity. Nose:  No deformity, discharge,  or lesions. Mouth:  No deformity or lesions, dentition normal. Neck:  Supple; no masses or thyromegaly. Lungs:  Clear throughout to auscultation.   No wheezes, crackles, or rhonchi. No acute distress. Heart:  Regular rate and rhythm; no murmurs, clicks, rubs,  or gallops. Abdomen:  Large lower abd midline scar healed c/w previous appendectomy.  RUQ scars c/w cholecystectomy well-healed.  Soft, nontender and nondistended. No masses, hepatosplenomegaly or hernias noted. Normal bowel sounds, without guarding, and without rebound.    Rectal:  Deferred until time of colonoscopy.    Msk:  Symmetrical without gross deformities. Normal posture. Pulses:  Normal pulses noted. Extremities:  Without clubbing or edema. Neurologic:  Alert and  oriented x4;  grossly normal neurologically. Skin:  Intact without significant lesions or rashes. Cervical Nodes:  No significant cervical adenopathy. Psych:  Alert and cooperative. Normal  mood and affect.     Glendora Score  06/30/2011  3:46 PM  Signed Cc to PCP  Jonette Eva, MD  07/15/2011  2:14 PM  Signed AGREE        GERD (gastroesophageal reflux disease) - Lorenza Burton, NP  06/30/2011  9:28 AM  Addendum Baldo Ash is a 35 y.o. black female w/ refractory GERD, anorexia & abdominal pain.  Hx h pylori infection & reports treatment twice.  She is going to require further evaluation via EGD to r/o PUD, h pylori gastritis, & erosive esophagitis.   I have discussed risks & benefits which include, but are not limited to, bleeding, infection, perforation & drug reaction.  The patient agrees with this plan & written consent will be obtained.     Stop zantac Stop carafate the day before the EGD Start Dexilant 60mg  daily (2 boxes samples given)     Previous Version  History of colonic polyps - Lorenza Burton, NP  06/30/2011  9:26 AM  Signed Polypectomy w/ colonoscopy in [facility].  We will attempt to get old records to determine timing of next colonoscopy.  Pt cannot remember MD name, but is going to investigate further & call us back.

## 2011-07-19 ENCOUNTER — Telehealth: Payer: Self-pay

## 2011-07-19 NOTE — Telephone Encounter (Signed)
Pt came by and requested samples of the Dexilant . She does not have insurance to cover medications. She has been getting 2-5 pills at the pharmacy as she could afford.

## 2011-07-20 ENCOUNTER — Telehealth: Payer: Self-pay | Admitting: Gastroenterology

## 2011-07-20 NOTE — Telephone Encounter (Signed)
Cc path to PCP 

## 2011-07-20 NOTE — Telephone Encounter (Signed)
The note on 07/19/2011 was incomplete. I gave pt # 10 samples of Dexilant and papers to fill out to see if she would qualify for assistance.

## 2011-07-20 NOTE — Telephone Encounter (Signed)
PLEASE CALL PT. Her stomach biopsies were nl. Her nausea, vomiting, and epigastric pain are from reflux. Continue Dexilant. Follow GERD lifestyle modifications. OPV in 2-3 mos w/ KJ.

## 2011-07-21 ENCOUNTER — Encounter: Payer: Self-pay | Admitting: Gastroenterology

## 2011-07-21 NOTE — Telephone Encounter (Signed)
Pt aware.

## 2011-07-21 NOTE — Telephone Encounter (Signed)
Pt is aware of OV for 10/21/11 at 0830 with KJ

## 2011-07-23 ENCOUNTER — Encounter (HOSPITAL_COMMUNITY): Payer: Self-pay | Admitting: Gastroenterology

## 2011-07-29 ENCOUNTER — Telehealth: Payer: Self-pay | Admitting: Urgent Care

## 2011-07-29 MED ORDER — DEXLANSOPRAZOLE 60 MG PO CPDR: 60.0000 mg | DELAYED_RELEASE_CAPSULE | Freq: Every day | ORAL | Status: DC

## 2011-07-29 NOTE — Telephone Encounter (Signed)
Rx printed w/ forms

## 2011-07-29 NOTE — Telephone Encounter (Signed)
rx and forms faxed to pt assistance

## 2011-09-21 LAB — POCT PREGNANCY, URINE: Preg Test, Ur: NEGATIVE

## 2011-09-21 LAB — POCT URINALYSIS DIP (DEVICE)
Bilirubin Urine: NEGATIVE
Glucose, UA: NEGATIVE
Nitrite: NEGATIVE
Operator id: 235561
Specific Gravity, Urine: 1.02
Urobilinogen, UA: 2 — ABNORMAL HIGH

## 2011-10-21 ENCOUNTER — Ambulatory Visit: Payer: Self-pay | Admitting: Urgent Care

## 2011-10-21 ENCOUNTER — Telehealth: Payer: Self-pay | Admitting: Urgent Care

## 2011-10-21 NOTE — Telephone Encounter (Signed)
Noted  

## 2011-10-21 NOTE — Telephone Encounter (Signed)
Pt was a no show

## 2012-01-16 ENCOUNTER — Emergency Department (HOSPITAL_COMMUNITY)
Admission: EM | Admit: 2012-01-16 | Discharge: 2012-01-16 | Disposition: A | Discharge status: Home / Self Care | Payer: Self-pay | Attending: Emergency Medicine | Admitting: Emergency Medicine

## 2012-01-16 ENCOUNTER — Encounter (HOSPITAL_COMMUNITY): Payer: Self-pay | Admitting: *Deleted

## 2012-01-16 DIAGNOSIS — R142 Eructation: Secondary | ICD-10-CM | POA: Insufficient documentation

## 2012-01-16 DIAGNOSIS — R109 Unspecified abdominal pain: Secondary | ICD-10-CM | POA: Insufficient documentation

## 2012-01-16 DIAGNOSIS — R141 Gas pain: Secondary | ICD-10-CM | POA: Insufficient documentation

## 2012-01-16 DIAGNOSIS — R143 Flatulence: Secondary | ICD-10-CM | POA: Insufficient documentation

## 2012-01-16 DIAGNOSIS — Z9889 Other specified postprocedural states: Secondary | ICD-10-CM | POA: Insufficient documentation

## 2012-01-16 MED ORDER — HYDROCODONE-ACETAMINOPHEN 5-325 MG PO TABS
1.0000 | ORAL_TABLET | Freq: Once | ORAL | Status: AC
  Administered 2012-01-16: 1 via ORAL
  Filled 2012-01-16: qty 1

## 2012-01-16 NOTE — ED Notes (Signed)
Patient requesting something for pain and medication for BM. Per patient only has one BM per week due to resectioning of colon. EDP made aware, order given for something for pain. Patient given recommendation to get over the counter MIralax, patient verbalized understanding.

## 2012-01-16 NOTE — ED Notes (Signed)
Patient ate a bag of torillo chips and sauce and now has gas and back pain

## 2012-01-16 NOTE — ED Provider Notes (Signed)
History     CSN: [id number]  Arrival date & time 01/16/12  0603   First MD Initiated Contact with Patient 01/16/12 [phone]      Chief Complaint  Patient presents with  . GI Problem    (Consider location/radiation/quality/duration/timing/severity/associated sxs/prior treatment) HPI Kim Fitzgerald is a 36 y.o. female who presents to the Emergency Department complaining of lower abdominal pain and gas after eating a bag or tortilla chips and salsa last night. She has taken both gas x and pepto bismol without relief. Denies nausea, vomiting. She has not had a bowel movement since the lower abdominal pain began.  PCP Dr. Loleta Chance   Past Medical History  Diagnosis Date  . H. pylori infection      treated twice  . S/P colonoscopy 2006    [place]-[facility] w/Lukasik(PAC), 1 polyp-benign  . GERD (gastroesophageal reflux disease)     Past Surgical History  Procedure Date  . Appendectomy   . Cholecystectomy     cholelithiasis  . Ovarian cyst removal 2010    laproscopic  . Esophagogastroduodenoscopy 07/16/2011    Procedure: ESOPHAGOGASTRODUODENOSCOPY (EGD);  Surgeon: Arlyce Harman, MD;  Location: [facility];  Service: Endoscopy;  Laterality: N/A;    Family History  Problem Relation Age of Onset  . Crohn's disease Brother 83  . Alcohol abuse Father   . Heart failure Mother     History  Substance Use Topics  . Smoking status: Former Smoker -- 1.0 packs/day for 12 years    Types: Cigarettes    Quit date: 07/12/2011  . Smokeless tobacco: Not on file  . Alcohol Use: No    OB History    Grav Para Term Preterm Abortions TAB SAB Ect Mult Living                  Review of Systems 10 Systems reviewed and are negative for acute change except as noted in the HPI.  Allergies  Review of patient's allergies indicates no known allergies.  Home Medications  No current outpatient prescriptions on file.  BP 116/90  Pulse 90  Temp 98.2 F (36.8 C)  Resp 20  Ht 5\' 6"  (1.676 m)   Wt 200 lb (90.719 kg)  BMI 32.28 kg/m2  SpO2 99%  LMP 01/09/2012  Physical Exam  Nursing note and vitals reviewed. Constitutional: She is oriented to person, place, and time. She appears well-developed and well-nourished. No distress.  HENT:  Head: Normocephalic and atraumatic.  Eyes: EOM are normal. Pupils are equal, round, and reactive to light.  Neck: Normal range of motion.  Cardiovascular: Normal rate, normal heart sounds and intact distal pulses.   Pulmonary/Chest: Effort normal and breath sounds normal.  Abdominal: Soft. She exhibits no distension and no mass. There is no tenderness. There is no rebound and no guarding.       Hyperactive bowel sounds  Musculoskeletal: Normal range of motion.  Neurological: She is alert and oriented to person, place, and time.  Skin: Skin is warm and dry.    ED Course  Procedures (including critical care time)     MDM  Patient with abdominal pain and flatus after eating a bag of tortilla chips and salsa. No intervention while in the ER. Pt stable in ED with no significant deterioration in condition.The patient appears reasonably screened and/or stabilized for discharge and I doubt any other medical condition or other [facility] requiring further screening, evaluation, or treatment in the ED at this time prior to discharge.  MDM Reviewed: nursing note and vitals           Nicoletta Dress. Colon Branch, MD 01/16/12 [phone]

## 2012-03-01 ENCOUNTER — Encounter (HOSPITAL_COMMUNITY): Payer: Self-pay | Admitting: Emergency Medicine

## 2012-03-01 ENCOUNTER — Emergency Department (HOSPITAL_COMMUNITY)
Admission: EM | Admit: 2012-03-01 | Discharge: 2012-03-01 | Disposition: A | Discharge status: Home / Self Care | Payer: Self-pay | Attending: Emergency Medicine | Admitting: Emergency Medicine

## 2012-03-01 DIAGNOSIS — R10817 Generalized abdominal tenderness: Secondary | ICD-10-CM | POA: Insufficient documentation

## 2012-03-01 DIAGNOSIS — K219 Gastro-esophageal reflux disease without esophagitis: Secondary | ICD-10-CM | POA: Insufficient documentation

## 2012-03-01 DIAGNOSIS — Z79899 Other long term (current) drug therapy: Secondary | ICD-10-CM | POA: Insufficient documentation

## 2012-03-01 DIAGNOSIS — R112 Nausea with vomiting, unspecified: Secondary | ICD-10-CM | POA: Insufficient documentation

## 2012-03-01 LAB — BASIC METABOLIC PANEL
CO2: 26 mEq/L (ref 19–32)
Calcium: 10.2 mg/dL (ref 8.4–10.5)
Chloride: 100 mEq/L (ref 96–112)
Potassium: 3.8 mEq/L (ref 3.5–5.1)
Sodium: 137 mEq/L (ref 135–145)

## 2012-03-01 LAB — POCT I-STAT, CHEM 8
BUN: 7 mg/dL (ref 6–23)
Chloride: 103 mEq/L (ref 96–112)
Sodium: 140 mEq/L (ref 135–145)
TCO2: 27 mmol/L (ref 0–100)

## 2012-03-01 LAB — URINALYSIS, ROUTINE W REFLEX MICROSCOPIC
Leukocytes, UA: NEGATIVE
Protein, ur: NEGATIVE mg/dL
Specific Gravity, Urine: 1.036 — ABNORMAL HIGH (ref 1.005–1.030)
Urobilinogen, UA: 0.2 mg/dL (ref 0.0–1.0)

## 2012-03-01 LAB — DIFFERENTIAL
Basophils Absolute: 0 10*3/uL (ref 0.0–0.1)
Lymphocytes Relative: 17 % (ref 12–46)
Neutro Abs: 8.1 10*3/uL — ABNORMAL HIGH (ref 1.7–7.7)
Neutrophils Relative %: 77 % (ref 43–77)

## 2012-03-01 LAB — PREGNANCY, URINE: Preg Test, Ur: NEGATIVE

## 2012-03-01 LAB — CBC
Platelets: 233 10*3/uL (ref 150–400)
RDW: 11.4 % — ABNORMAL LOW (ref 11.5–15.5)
WBC: 10.4 10*3/uL (ref 4.0–10.5)

## 2012-03-01 MED ORDER — SODIUM CHLORIDE 0.9 % IV BOLUS (SEPSIS)
1000.0000 mL | Freq: Once | INTRAVENOUS | Status: AC
  Administered 2012-03-01: 1000 mL via INTRAVENOUS

## 2012-03-01 MED ORDER — PROMETHAZINE HCL 25 MG/ML IJ SOLN
12.5000 mg | INTRAMUSCULAR | Status: AC
  Administered 2012-03-01: 12.5 mg via INTRAVENOUS
  Filled 2012-03-01: qty 1

## 2012-03-01 MED ORDER — HYDROCODONE-ACETAMINOPHEN 5-325 MG PO TABS: 1.0000 | ORAL_TABLET | Freq: Four times a day (QID) | ORAL | Status: AC | PRN

## 2012-03-01 MED ORDER — PROMETHAZINE HCL 25 MG/ML IJ SOLN
12.5000 mg | Freq: Once | INTRAMUSCULAR | Status: AC
  Administered 2012-03-01: 12.5 mg via INTRAVENOUS
  Filled 2012-03-01: qty 1

## 2012-03-01 MED ORDER — MORPHINE SULFATE 4 MG/ML IJ SOLN
4.0000 mg | Freq: Once | INTRAMUSCULAR | Status: AC
  Administered 2012-03-01: 4 mg via INTRAVENOUS
  Filled 2012-03-01: qty 1

## 2012-03-01 MED ORDER — ONDANSETRON HCL 4 MG/2ML IJ SOLN
4.0000 mg | Freq: Once | INTRAMUSCULAR | Status: AC
  Administered 2012-03-01: 4 mg via INTRAVENOUS
  Filled 2012-03-01: qty 2

## 2012-03-01 MED ORDER — PROMETHAZINE HCL 25 MG PO TABS: 25.0000 mg | ORAL_TABLET | Freq: Four times a day (QID) | ORAL | Status: DC | PRN

## 2012-03-01 NOTE — Discharge Instructions (Signed)
Slowly increase your fluid intake.  Return here as needed for any worsening in your condition  B.R.A.T. Diet Your doctor has recommended the B.R.A.T. diet for you or your child until the condition improves. This is often used to help control diarrhea and vomiting symptoms. If you or your child can tolerate clear liquids, you may have:  Bananas.   Rice.   Applesauce.   Toast (and other simple starches such as crackers, potatoes, noodles).  Be sure to avoid dairy products, meats, and fatty foods until symptoms are better. Fruit juices such as apple, grape, and prune juice can make diarrhea worse. Avoid these. Continue this diet for 2 days or as instructed by your caregiver. Document Released: 12/06/2005 Document Revised: 11/25/2011 Document Reviewed: 05/25/2007 [facility] Patient Information 2012 [place], [place].

## 2012-03-01 NOTE — ED Notes (Signed)
Discharged home with husband. Steady gait. Written and verbal instructions

## 2012-03-01 NOTE — ED Notes (Signed)
Ambulatory to bathroom without difficulty.  No vomiting noted at present. Continue on IV fluids.

## 2012-03-01 NOTE — ED Notes (Signed)
Pt reports gingerale made her throat/stomach burn, pt given water

## 2012-03-01 NOTE — ED Notes (Signed)
PT. REPORTS VOMITTING WITH MID ABDOMINAL CRAMPING ONSET THIS MORNING , DENIES DIARRHEA. NO FEVER OR CHILLS.

## 2012-03-01 NOTE — ED Provider Notes (Signed)
History     CSN: [id number]  Arrival date & time 03/01/12  6578   First MD Initiated Contact with Patient 03/01/12 0532      Chief Complaint  Patient presents with  . Emesis    (Consider location/radiation/quality/duration/timing/severity/associated sxs/prior treatment) Patient is a 36 y.o. female presenting with vomiting.  Emesis   HPI: Ms. Kim Fitzgerald is a 36 yo female with a history of H. Pylori, GERD, S/p appendectomy and cholecystectomy who presents to the ED today with complaints of "vomiting."  Pt states she awoke up around midnight with crampy gas pain, then re-awakened with vomiting.  She has continued vomiting since the time.  She states she had the "flu" a few months ago, but this feels different and there is more burning involved.  She has had one episode of diarrhea.  Nothing makes the pain better or worse.  Pt is tender in the lower abdomen and suprapubic area.  She denies any sick contacts, fever, chills, headache, fatigue, blood in emesis or bowel movement, vaginal bleeding or discharge.       Past Medical History  Diagnosis Date  . H. pylori infection      treated twice  . S/P colonoscopy 2006    [place]-MD w/Lukasik(PAC), 1 polyp-benign  . GERD (gastroesophageal reflux disease)     Past Surgical History  Procedure Date  . Appendectomy   . Cholecystectomy     cholelithiasis  . Ovarian cyst removal 2010    laproscopic  . Esophagogastroduodenoscopy 07/16/2011    Procedure: ESOPHAGOGASTRODUODENOSCOPY (EGD);  Surgeon: Arlyce Harman, MD;  Location: [facility];  Service: Endoscopy;  Laterality: N/A;    Family History  Problem Relation Age of Onset  . Crohn's disease Brother 23  . Alcohol abuse Father   . Heart failure Mother     History  Substance Use Topics  . Smoking status: Former Smoker -- 1.0 packs/day for 12 years    Types: Cigarettes    Quit date: 07/12/2011  . Smokeless tobacco: Not on file  . Alcohol Use: No    OB History    Grav Para  Term Preterm Abortions TAB SAB Ect Mult Living                  Review of Systems  Gastrointestinal: Positive for vomiting.   All pertinent positives and negatives reviewed in the history of present illness  Allergies  Review of patient's allergies indicates no known allergies.  Home Medications   Current Outpatient Rx  Name Route Sig Dispense Refill  . DEXLANSOPRAZOLE 60 MG PO CPDR Oral Take 60 mg by mouth daily.    Marland Kitchen GABAPENTIN 300 MG PO CAPS Oral Take 300 mg by mouth 2 (two) times daily.      BP 120/76  Pulse 84  Temp(Src) 97.8 F (36.6 C) (Oral)  Resp 17  SpO2 100%  LMP 01/04/2012  Physical Exam  Nursing note and vitals reviewed. Constitutional: She is oriented to person, place, and time. She appears well-developed and well-nourished. No distress.  HENT:  Head: Normocephalic and atraumatic.  Eyes: Pupils are equal, round, and reactive to light.  Cardiovascular: Normal rate, regular rhythm, normal heart sounds and intact distal pulses.  Exam reveals no gallop and no friction rub.   No murmur heard. Pulmonary/Chest: Effort normal and breath sounds normal. No respiratory distress. She has no wheezes.  Abdominal: Soft. Normal appearance and bowel sounds are normal. She exhibits no distension. There is no hepatosplenomegaly. There is generalized tenderness (  tenderness over RLQ and LLQ). There is no tenderness at McBurney's point and negative Murphy's sign.  Neurological: She is alert and oriented to person, place, and time.  Skin: Skin is warm and dry. No rash noted. She is not diaphoretic.    ED Course  Procedures (including critical care time)  Labs Reviewed  URINALYSIS, ROUTINE W REFLEX MICROSCOPIC - Abnormal; Notable for the following:    Color, Urine AMBER (*) BIOCHEMICALS MAY BE AFFECTED BY COLOR   APPearance CLOUDY (*)    Specific Gravity, Urine 1.036 (*)    All other components within normal limits  CBC - Abnormal; Notable for the following:    RBC 3.86 (*)     MCH 34.7 (*)    RDW 11.4 (*)    All other components within normal limits  DIFFERENTIAL - Abnormal; Notable for the following:    Neutro Abs 8.1 (*)    All other components within normal limits  BASIC METABOLIC PANEL - Abnormal; Notable for the following:    Glucose, Bld 120 (*)    All other components within normal limits  POCT I-STAT, CHEM 8 - Abnormal; Notable for the following:    Glucose, Bld 125 (*)    All other components within normal limits  PREGNANCY, URINE      Examined the patient and started IV NS and gave antiemetics along with an analgesic.  UA was significant for a high specific gravity and an amber color.  Patient was asleep at next check.    At 910, pt was assessed again and given more antiemetics as nausea and vomiting had returned.  She did not feel an oral liquid tolerance was appropriate at the time.  Will continue to try this in hope of D/C soon.   The patient has tolerated oral fluids. Will have the patient return here for any worsening in her condition.  Patient has soreness throughout her abdomen mainly from vomiting. MDM  MDM Reviewed: nursing note and vitals Interpretation: labs      Medical screening examination/treatment/procedure(s) were performed by non-physician practitioner and as supervising physician I was immediately available for consultation/collaboration. Osvaldo Human, M.D.       Carlyle Dolly, PA-C 03/01/12 1124  Carleene Cooper III, MD 03/01/12 [phone]

## 2012-03-01 NOTE — ED Notes (Signed)
Pt with episode of vomiting.

## 2012-05-16 ENCOUNTER — Encounter (HOSPITAL_COMMUNITY): Payer: Self-pay

## 2012-05-16 ENCOUNTER — Emergency Department (HOSPITAL_COMMUNITY): Payer: Self-pay

## 2012-05-16 ENCOUNTER — Emergency Department (HOSPITAL_COMMUNITY)
Admission: EM | Admit: 2012-05-16 | Discharge: 2012-05-16 | Disposition: A | Discharge status: Home / Self Care | Payer: Self-pay | Attending: Emergency Medicine | Admitting: Emergency Medicine

## 2012-05-16 DIAGNOSIS — K219 Gastro-esophageal reflux disease without esophagitis: Secondary | ICD-10-CM | POA: Insufficient documentation

## 2012-05-16 DIAGNOSIS — Z79899 Other long term (current) drug therapy: Secondary | ICD-10-CM | POA: Insufficient documentation

## 2012-05-16 DIAGNOSIS — R112 Nausea with vomiting, unspecified: Secondary | ICD-10-CM | POA: Insufficient documentation

## 2012-05-16 LAB — URINALYSIS, ROUTINE W REFLEX MICROSCOPIC
Glucose, UA: NEGATIVE mg/dL
Nitrite: NEGATIVE
Protein, ur: NEGATIVE mg/dL
Urobilinogen, UA: 0.2 mg/dL (ref 0.0–1.0)

## 2012-05-16 LAB — POCT PREGNANCY, URINE: Preg Test, Ur: NEGATIVE

## 2012-05-16 MED ORDER — MORPHINE SULFATE 2 MG/ML IJ SOLN
2.0000 mg | Freq: Once | INTRAMUSCULAR | Status: AC
  Administered 2012-05-16: 2 mg via INTRAVENOUS
  Filled 2012-05-16: qty 1

## 2012-05-16 MED ORDER — ONDANSETRON HCL 4 MG/2ML IJ SOLN
4.0000 mg | Freq: Once | INTRAMUSCULAR | Status: AC
  Administered 2012-05-16: 4 mg via INTRAVENOUS
  Filled 2012-05-16: qty 2

## 2012-05-16 MED ORDER — SODIUM CHLORIDE 0.9 % IV SOLN
Freq: Once | INTRAVENOUS | Status: AC
  Administered 2012-05-16: 02:00:00 via INTRAVENOUS

## 2012-05-16 NOTE — ED Notes (Signed)
Pt c/o abd pain with nausea and vomiting after eating that started after 4pm. Denies diarrhea

## 2012-05-16 NOTE — ED Provider Notes (Signed)
History     CSN: [id number]  Arrival date & time 05/16/12  Kim Fitzgerald   First MD Initiated Contact with Patient 05/16/12 0125      Chief Complaint  Patient presents with  . Abdominal Pain    (Consider location/radiation/quality/duration/timing/severity/associated sxs/prior treatment) HPI Kim Fitzgerald is a 36 y.o. female who presents to the Emergency Department complaining of abdominal pain that began after she ate at 4:30 PM. Pain has been associated with nausea and vomiting but no diarrhea. Denies fever, chills.   PCP Dr. Loleta Chance   Past Medical History  Diagnosis Date  . H. pylori infection      treated twice  . S/P colonoscopy 2006    [place]-MD w/Lukasik(PAC), 1 polyp-benign  . GERD (gastroesophageal reflux disease)     Past Surgical History  Procedure Date  . Appendectomy   . Cholecystectomy     cholelithiasis  . Ovarian cyst removal 2010    laproscopic  . Esophagogastroduodenoscopy 07/16/2011    Procedure: ESOPHAGOGASTRODUODENOSCOPY (EGD);  Surgeon: Arlyce Harman, MD;  Location: [facility];  Service: Endoscopy;  Laterality: N/A;    Family History  Problem Relation Age of Onset  . Crohn's disease Brother 51  . Alcohol abuse Father   . Heart failure Mother     History  Substance Use Topics  . Smoking status: Former Smoker -- 1.0 packs/day for 12 years    Types: Cigarettes    Quit date: 07/12/2011  . Smokeless tobacco: Not on file  . Alcohol Use: No    OB History    Grav Para Term Preterm Abortions TAB SAB Ect Mult Living                  Review of Systems  Constitutional: Negative for fever.       10 Systems reviewed and are negative for acute change except as noted in the HPI.  HENT: Negative for congestion.   Eyes: Negative for discharge and redness.  Respiratory: Negative for cough and shortness of breath.   Cardiovascular: Negative for chest pain.  Gastrointestinal: Positive for nausea, vomiting and abdominal pain.  Musculoskeletal:  Negative for back pain.  Skin: Negative for rash.  Neurological: Negative for syncope, numbness and headaches.  Psychiatric/Behavioral:       No behavior change.    Allergies  Review of patient's allergies indicates no known allergies.  Home Medications   Current Outpatient Rx  Name Route Sig Dispense Refill  . DEXLANSOPRAZOLE 60 MG PO CPDR Oral Take 60 mg by mouth daily.    Marland Kitchen GABAPENTIN 300 MG PO CAPS Oral Take 300 mg by mouth 2 (two) times daily.      BP 114/68  Pulse 84  Temp(Src) 98 F (36.7 C) (Oral)  Resp 16  Ht 5\' 6"  (1.676 m)  Wt 190 lb (86.183 kg)  BMI 30.67 kg/m2  SpO2 99%  LMP 12/17/2011  Physical Exam  Nursing note and vitals reviewed. Constitutional:       Awake, alert, nontoxic appearance.  HENT:  Head: Atraumatic.  Eyes: Right eye exhibits no discharge. Left eye exhibits no discharge.  Neck: Neck supple.  Cardiovascular: Normal rate, normal heart sounds and intact distal pulses.   Pulmonary/Chest: Effort normal. She exhibits no tenderness.  Abdominal: Soft. She exhibits no distension. There is tenderness. There is no rebound and no guarding.       Generalized lower abdominal discomfort to palpation. Bowel sounds present.  Genitourinary:       No cva  tenderness  Musculoskeletal: She exhibits no tenderness.       Baseline ROM, no obvious new focal weakness.  Neurological:       Mental status and motor strength appears baseline for patient and situation.  Skin: No rash noted.  Psychiatric: She has a normal mood and affect.    ED Course  Procedures (including critical care time) Results for orders placed during the hospital encounter of 05/16/12  URINALYSIS, ROUTINE W REFLEX MICROSCOPIC      Component Value Range   Color, Urine YELLOW  YELLOW    APPearance CLEAR  CLEAR    Specific Gravity, Urine <1.005 (*) 1.005 - 1.030    pH 7.0  5.0 - 8.0    Glucose, UA NEGATIVE  NEGATIVE (mg/dL)   Hgb urine dipstick NEGATIVE  NEGATIVE    Bilirubin Urine  NEGATIVE  NEGATIVE    Ketones, ur NEGATIVE  NEGATIVE (mg/dL)   Protein, ur NEGATIVE  NEGATIVE (mg/dL)   Urobilinogen, UA 0.2  0.0 - 1.0 (mg/dL)   Nitrite NEGATIVE  NEGATIVE    Leukocytes, UA NEGATIVE  NEGATIVE   PREGNANCY, URINE      Component Value Range   Preg Test, Ur NEGATIVE  NEGATIVE   POCT PREGNANCY, URINE      Component Value Range   Preg Test, Ur NEGATIVE  NEGATIVE     Dg Abd Acute W/chest  05/16/2012  *RADIOLOGY REPORT*  Clinical Data: Abdominal pain with nausea and vomiting.  ACUTE ABDOMEN SERIES (ABDOMEN 2 VIEW & CHEST 1 VIEW)  Comparison: 07/01/2011  Findings: The heart size and pulmonary vascularity are normal. The lungs appear clear and expanded without focal air space disease or consolidation. No blunting of the costophrenic angles.  No pneumothorax.  Stool filled nondistended colon.  Mild gaseous distension of a few mid abdominal small bowel loops with air-fluid levels.  Changes suggest early obstruction.  No free intra-abdominal air.  No radiopaque stones.  Visualized bones appear intact.  IMPRESSION: Mild gaseous distension and air-fluid levels and mid abdominal small bowel suggesting early obstruction.  No evidence of active pulmonary disease.  Original Report Authenticated By: Marlon Pel, M.D.   MDM  Patient with generalized lower abdominal pain, nausea, vomiting after eating this afternoon. UA is negative. Acute abdominal series shows gaseous distention and air fluid levels in the mid abdominal small bowel. Patient was given IV fluids, analgesics, and time the, PPI, with good response and relief of discomfort.Pt stable in ED with no significant deterioration in condition.The patient appears reasonably screened and/or stabilized for discharge and I doubt any other medical condition or other [facility] requiring further screening, evaluation, or treatment in the ED at this time prior to discharge.  MDM Reviewed: nursing note and vitals Interpretation: labs and  x-ray           Nicoletta Dress. Colon Branch, MD 05/17/12 [phone]

## 2012-05-16 NOTE — Discharge Instructions (Signed)
Your xrays show a lot of gas in the abdomen. Use a mild laxative like Miralax to have several bowel movements. The gas will pass with the stool.

## 2012-07-03 ENCOUNTER — Other Ambulatory Visit: Payer: Self-pay

## 2012-07-03 ENCOUNTER — Encounter: Payer: Self-pay | Admitting: Gastroenterology

## 2012-07-03 MED ORDER — DEXLANSOPRAZOLE 60 MG PO CPDR: 60.0000 mg | DELAYED_RELEASE_CAPSULE | Freq: Every day | ORAL | Status: DC

## 2012-07-03 NOTE — Telephone Encounter (Signed)
PT NS last OV. Needs FU OV please arrange. Thanks

## 2012-07-03 NOTE — Telephone Encounter (Signed)
Mailed letter to patient to call office.

## 2012-07-04 ENCOUNTER — Encounter: Payer: Self-pay | Admitting: Gastroenterology

## 2012-07-04 ENCOUNTER — Telehealth: Payer: Self-pay

## 2012-07-04 NOTE — Telephone Encounter (Signed)
Darl Pikes, please schedule pt appt to further refills.

## 2012-07-04 NOTE — Telephone Encounter (Signed)
Mailed letter for patient to call me to set up OV to further her refills

## 2012-07-04 NOTE — Telephone Encounter (Signed)
Pt needs FU OV 1st. No showed last OV. Has not been seen in a year. Thanks

## 2012-07-04 NOTE — Telephone Encounter (Signed)
Received fax from [facility] pt assistance. Pt needs new rx for her Dexilant. rx needs to be printed and faxed to pt assistance at [phone]

## 2012-08-19 ENCOUNTER — Encounter (HOSPITAL_COMMUNITY): Payer: Self-pay | Admitting: *Deleted

## 2012-08-19 ENCOUNTER — Emergency Department (HOSPITAL_COMMUNITY)
Admission: EM | Admit: 2012-08-19 | Discharge: 2012-08-19 | Disposition: A | Discharge status: Home / Self Care | Payer: Self-pay | Attending: Emergency Medicine | Admitting: Emergency Medicine

## 2012-08-19 DIAGNOSIS — S61219A Laceration without foreign body of unspecified finger without damage to nail, initial encounter: Secondary | ICD-10-CM

## 2012-08-19 DIAGNOSIS — K219 Gastro-esophageal reflux disease without esophagitis: Secondary | ICD-10-CM | POA: Insufficient documentation

## 2012-08-19 DIAGNOSIS — Y9389 Activity, other specified: Secondary | ICD-10-CM | POA: Insufficient documentation

## 2012-08-19 DIAGNOSIS — W278XXA Contact with other nonpowered hand tool, initial encounter: Secondary | ICD-10-CM | POA: Insufficient documentation

## 2012-08-19 DIAGNOSIS — Z87891 Personal history of nicotine dependence: Secondary | ICD-10-CM | POA: Insufficient documentation

## 2012-08-19 DIAGNOSIS — S61209A Unspecified open wound of unspecified finger without damage to nail, initial encounter: Secondary | ICD-10-CM | POA: Insufficient documentation

## 2012-08-19 DIAGNOSIS — Y998 Other external cause status: Secondary | ICD-10-CM | POA: Insufficient documentation

## 2012-08-19 MED ORDER — TETANUS-DIPHTH-ACELL PERTUSSIS 5-2.5-18.5 LF-MCG/0.5 IM SUSP
0.5000 mL | Freq: Once | INTRAMUSCULAR | Status: AC
  Administered 2012-08-19: 0.5 mL via INTRAMUSCULAR

## 2012-08-19 MED ORDER — HYDROCODONE-ACETAMINOPHEN 7.5-325 MG PO TABS: 1.0000 | ORAL_TABLET | ORAL | Status: AC | PRN

## 2012-08-19 MED ORDER — TETANUS-DIPHTH-ACELL PERTUSSIS 5-2.5-18.5 LF-MCG/0.5 IM SUSP
INTRAMUSCULAR | Status: AC
  Filled 2012-08-19: qty 0.5

## 2012-08-19 NOTE — ED Notes (Signed)
Pt had brand new razor to shave with and when she went to remove the cover off the razor part, it slipped and cut her right thumb, still oozing blood, pt holding pressure with sterile water soaked gauze at this time

## 2012-08-19 NOTE — ED Notes (Signed)
H. Bryant, PA at bedside. 

## 2012-08-19 NOTE — ED Notes (Signed)
Pt states she cut her right thumb with a razor this morning. NAD. Unsure of last tetanus.

## 2012-08-19 NOTE — ED Provider Notes (Signed)
History     CSN: [id number]  Arrival date & time 08/19/12  1106   First MD Initiated Contact with Patient 08/19/12 1212      Chief Complaint  Patient presents with  . Extremity Laceration    (Consider location/radiation/quality/duration/timing/severity/associated sxs/prior treatment) HPI Comments: Patient states that this a.m. she was opening a new razor while in the shower. Her hand slipped and she cut her right thumb. She applied pressure but could not keep the bleeding controlled. She presents to the emergency department for additional evaluation. Patient is unsure of her last tetanus. The patient denies being on any blood thinning agents or having any bleeding disorders.  The history is provided by the patient.    Past Medical History  Diagnosis Date  . H. pylori infection      treated twice  . S/P colonoscopy 2006    [place]-[facility] w/L[place](PAC), 1 polyp-benign  . GERD (gastroesophageal reflux disease)     Past Surgical History  Procedure Date  . Appendectomy   . Cholecystectomy     cholelithiasis  . Ovarian cyst removal 2010    laproscopic  . Esophagogastroduodenoscopy 07/16/2011    Procedure: ESOPHAGOGASTRODUODENOSCOPY (EGD);  Surgeon: Arlyce Harman, MD;  Location: [facility];  Service: Endoscopy;  Laterality: N/A;    Family History  Problem Relation Age of Onset  . Crohn's disease Brother 20  . Alcohol abuse Father   . Heart failure Mother     History  Substance Use Topics  . Smoking status: Former Smoker -- 1.0 packs/day for 12 years    Types: Cigarettes    Quit date: 07/12/2011  . Smokeless tobacco: Not on file  . Alcohol Use: No    OB History    Grav Para Term Preterm Abortions TAB SAB Ect Mult Living                  Review of Systems  Constitutional: Negative for activity change.       All ROS Neg except as noted in HPI  HENT: Negative for nosebleeds and neck pain.   Eyes: Negative for photophobia and discharge.  Respiratory: Negative  for cough, shortness of breath and wheezing.   Cardiovascular: Negative for chest pain and palpitations.  Gastrointestinal: Negative for abdominal pain and blood in stool.  Genitourinary: Negative for dysuria, frequency and hematuria.  Musculoskeletal: Negative for back pain and arthralgias.  Skin: Negative.   Neurological: Negative for dizziness, seizures and speech difficulty.  Psychiatric/Behavioral: Negative for hallucinations and confusion.    Allergies  Review of patient's allergies indicates no known allergies.  Home Medications   Current Outpatient Rx  Name Route Sig Dispense Refill  . DEXLANSOPRAZOLE 60 MG PO CPDR Oral Take 1 capsule (60 mg total) by mouth daily. 31 capsule 0  . HYDROCODONE-ACETAMINOPHEN 7.5-325 MG PO TABS Oral Take 1 tablet by mouth every 4 (four) hours as needed for pain. 20 tablet 0    BP 114/70  Temp 98.5 F (36.9 C) (Oral)  Resp 16  SpO2 99%  LMP 07/20/2012  Physical Exam  Nursing note and vitals reviewed. Constitutional: She is oriented to person, place, and time. She appears well-developed and well-nourished.  Non-toxic appearance.  HENT:  Head: Normocephalic.  Right Ear: Tympanic membrane and external ear normal.  Left Ear: Tympanic membrane and external ear normal.  Eyes: EOM and lids are normal. Pupils are equal, round, and reactive to light.  Neck: Normal range of motion. Neck supple. Carotid bruit is not present.  Cardiovascular: Normal rate, regular rhythm, normal heart sounds, intact distal pulses and normal pulses.   Pulmonary/Chest: Breath sounds normal. No respiratory distress.  Abdominal: Soft. Bowel sounds are normal. There is no tenderness. There is no guarding.  Musculoskeletal: Normal range of motion.       The patient has an avulsion type laceration of the medial aspect of the right thumb. There is active bleeding present the patient is taken off the top layer of skin. There is no bone or tendon involvement.  Lymphadenopathy:         Head (right side): No submandibular adenopathy present.       Head (left side): No submandibular adenopathy present.    She has no cervical adenopathy.  Neurological: She is alert and oriented to person, place, and time. She has normal strength. No cranial nerve deficit or sensory deficit.  Skin: Skin is warm and dry.  Psychiatric: She has a normal mood and affect. Her speech is normal.    ED Course  Procedures: LACERATION REPAIR - patient identified by arm band. Permission for the procedure given by the patient. Procedural time out taken before evaluation of laceration to the right thumb. The wound was irrigated with tap water. There is active bleeding but the patient has avulsed the top layer of skin on the medial aspect of the right thumb. There is no bone or tendon involvement. The wound measures 2.3 cm. The area was painted with Betadine. Following this click clot hemostat was applied to the wound. Following this a pressure dressing was applied by me. Good hemostasis was achieved. Patient has good capillary refill after the procedure. Patient tetanus status was updated.  Labs Reviewed - No data to display No results found.   1. Laceration of finger of right hand       MDM  I have reviewed nursing notes, vital signs, and all appropriate lab and imaging results for this patient. Patient avulsed the skin of the right thumb with laceration. This was repaired and pressure dressing applied. Patient tolerated the procedure without problem. Tetanus was updated. Prescription for Norco #20 tablets was given patient for discomfort. Patient is to return if any changes, problems, or concerns.       Kathie Dike, [place] 08/19/12 1248

## 2012-08-21 NOTE — ED Provider Notes (Signed)
Medical screening examination/treatment/procedure(s) were performed by non-physician practitioner and as supervising physician I was immediately available for consultation/collaboration.   Shelda Jakes, MD 08/21/12 [phone]

## 2012-09-23 ENCOUNTER — Emergency Department (HOSPITAL_COMMUNITY): Payer: Self-pay

## 2012-09-23 ENCOUNTER — Emergency Department (HOSPITAL_COMMUNITY)
Admission: EM | Admit: 2012-09-23 | Discharge: 2012-09-23 | Disposition: A | Discharge status: Home / Self Care | Payer: Self-pay | Attending: Emergency Medicine | Admitting: Emergency Medicine

## 2012-09-23 ENCOUNTER — Encounter (HOSPITAL_COMMUNITY): Payer: Self-pay | Admitting: *Deleted

## 2012-09-23 DIAGNOSIS — R109 Unspecified abdominal pain: Secondary | ICD-10-CM | POA: Insufficient documentation

## 2012-09-23 DIAGNOSIS — R1110 Vomiting, unspecified: Secondary | ICD-10-CM

## 2012-09-23 DIAGNOSIS — Z9089 Acquired absence of other organs: Secondary | ICD-10-CM | POA: Insufficient documentation

## 2012-09-23 DIAGNOSIS — R112 Nausea with vomiting, unspecified: Secondary | ICD-10-CM | POA: Insufficient documentation

## 2012-09-23 LAB — URINALYSIS, ROUTINE W REFLEX MICROSCOPIC
Ketones, ur: NEGATIVE mg/dL
Leukocytes, UA: NEGATIVE
Nitrite: NEGATIVE
Protein, ur: NEGATIVE mg/dL
pH: 5.5 (ref 5.0–8.0)

## 2012-09-23 LAB — CBC WITH DIFFERENTIAL/PLATELET
Basophils Relative: 0 % (ref 0–1)
Eosinophils Absolute: 0.1 10*3/uL (ref 0.0–0.7)
HCT: 38.9 % (ref 36.0–46.0)
Hemoglobin: 14.1 g/dL (ref 12.0–15.0)
Lymphocytes Relative: 14 % (ref 12–46)
Lymphs Abs: 1.5 10*3/uL (ref 0.7–4.0)
MCH: 35.5 pg — ABNORMAL HIGH (ref 26.0–34.0)
MCHC: 36.2 g/dL — ABNORMAL HIGH (ref 30.0–36.0)
MCV: 98 fL (ref 78.0–100.0)
Monocytes Absolute: 0.3 10*3/uL (ref 0.1–1.0)
Neutro Abs: 8.6 10*3/uL — ABNORMAL HIGH (ref 1.7–7.7)
RDW: 11 % — ABNORMAL LOW (ref 11.5–15.5)

## 2012-09-23 LAB — COMPREHENSIVE METABOLIC PANEL
Alkaline Phosphatase: 77 U/L (ref 39–117)
BUN: 5 mg/dL — ABNORMAL LOW (ref 6–23)
Creatinine, Ser: 0.7 mg/dL (ref 0.50–1.10)
GFR calc Af Amer: >90 mL/min (ref >90)
Glucose, Bld: 110 mg/dL — ABNORMAL HIGH (ref 70–99)
Potassium: 3.7 mEq/L (ref 3.5–5.1)
Total Protein: 9.4 g/dL — ABNORMAL HIGH (ref 6.0–8.3)

## 2012-09-23 MED ORDER — IOHEXOL 300 MG/ML  SOLN
100.0000 mL | Freq: Once | INTRAMUSCULAR | Status: AC | PRN
  Administered 2012-09-23: 100 mL via INTRAVENOUS

## 2012-09-23 MED ORDER — HYDROCODONE-ACETAMINOPHEN 5-325 MG PO TABS: 1.0000 | ORAL_TABLET | Freq: Four times a day (QID) | ORAL | Status: DC | PRN

## 2012-09-23 MED ORDER — HYDROMORPHONE HCL PF 1 MG/ML IJ SOLN
1.0000 mg | Freq: Once | INTRAMUSCULAR | Status: AC
  Administered 2012-09-23: 1 mg via INTRAVENOUS
  Filled 2012-09-23: qty 1

## 2012-09-23 MED ORDER — SODIUM CHLORIDE 0.9 % IV BOLUS (SEPSIS)
1000.0000 mL | Freq: Once | INTRAVENOUS | Status: AC
  Administered 2012-09-23: 1000 mL via INTRAVENOUS

## 2012-09-23 MED ORDER — ONDANSETRON HCL 4 MG/2ML IJ SOLN
4.0000 mg | Freq: Once | INTRAMUSCULAR | Status: AC
  Administered 2012-09-23: 4 mg via INTRAVENOUS

## 2012-09-23 MED ORDER — ONDANSETRON HCL 4 MG/2ML IJ SOLN
INTRAMUSCULAR | Status: AC
  Administered 2012-09-23: 4 mg via INTRAVENOUS
  Filled 2012-09-23: qty 2

## 2012-09-23 MED ORDER — PROMETHAZINE HCL 25 MG PO TABS: 25.0000 mg | ORAL_TABLET | Freq: Four times a day (QID) | ORAL | Status: DC | PRN

## 2012-09-23 MED ORDER — METOCLOPRAMIDE HCL 5 MG/ML IJ SOLN
10.0000 mg | Freq: Once | INTRAMUSCULAR | Status: AC
  Administered 2012-09-23: 10 mg via INTRAVENOUS
  Filled 2012-09-23: qty 2

## 2012-09-23 MED ORDER — ONDANSETRON 8 MG PO TBDP
8.0000 mg | ORAL_TABLET | Freq: Once | ORAL | Status: AC
  Administered 2012-09-23: 8 mg via ORAL
  Filled 2012-09-23: qty 1

## 2012-09-23 NOTE — ED Provider Notes (Addendum)
History  This chart was scribed for Kim Lennert, MD by Kim Fitzgerald. This patient was seen in room [facility] and the patient's care was started at 07:20.   CSN: [id number]  Arrival date & time 09/23/12  0627   First MD Initiated Contact with Patient 09/23/12 0720      Chief Complaint  Patient presents with  . Abdominal Pain  . Vomiting    (Consider location/radiation/quality/duration/timing/severity/associated sxs/prior Treatment) Kim Fitzgerald is a 36 y.o. female who presents to the Emergency Department complaining of sudden onset nausea, emesis (10 episodes, one since arriving in the ED), and abdominal pain with associated burning in the throat since waking at 2 am this morning. Pt denies any associated difficulty or discomfort with urination or any known sick contacts. Pt reports previous episodes of similar symptoms, the last of which was about 6 months ago and was treated at [facility]. Pt reports a h/o GERD. Pt reports she was given a pill (Zofran) upon arrival in the ED and is now experiencing some relief from nausea. Pt's LNMP was last week. Patient is a 36 y.o. female presenting with abdominal pain. The history is provided by the patient. No language interpreter was used.  Abdominal Pain The primary symptoms of the illness include abdominal pain, nausea and vomiting. The primary symptoms of the illness do not include fatigue, diarrhea or dysuria. The current episode started 3 to 5 hours ago. The onset of the illness was sudden. The problem has been gradually improving.  The vomiting began today. Vomiting occurs more than 10 times per day. The emesis contains stomach contents.  The illness is associated with awakening from sleep. The patient states that she believes she is currently not pregnant. The patient has not had a change in bowel habit. Risk factors for an acute abdominal problem include a history of abdominal surgery. Symptoms associated with the illness do not include  hematuria, frequency or back pain. Associated symptoms comments: Burning in the throat. Significant associated medical issues include GERD.    Past Medical History  Diagnosis Date  . H. pylori infection      treated twice  . S/P colonoscopy 2006    [place]-[facility] w/Lukas[place](PAC), 1 polyp-benign  . GERD (gastroesophageal reflux disease)     Past Surgical History  Procedure Date  . Appendectomy   . Cholecystectomy     cholelithiasis  . Ovarian cyst removal 2010    laproscopic  . Esophagogastroduodenoscopy 07/16/2011    Procedure: ESOPHAGOGASTRODUODENOSCOPY (EGD);  Surgeon: Arlyce Harman, MD;  Location: [facility];  Service: Endoscopy;  Laterality: N/A;    Family History  Problem Relation Age of Onset  . Crohn's disease Brother 72  . Alcohol abuse Father   . Heart failure Mother     History  Substance Use Topics  . Smoking status: Former Smoker -- 1.0 packs/day for 12 years    Types: Cigarettes    Quit date: 07/12/2011  . Smokeless tobacco: Not on file  . Alcohol Use: No    OB History    Grav Para Term Preterm Abortions TAB SAB Ect Mult Living                  Review of Systems  Constitutional: Negative for fatigue.  HENT: Positive for sore throat. Negative for congestion, sinus pressure and ear discharge.   Eyes: Negative for discharge.  Respiratory: Negative for cough.   Cardiovascular: Negative for chest pain.  Gastrointestinal: Positive for nausea, vomiting and abdominal pain.  Negative for diarrhea.  Genitourinary: Negative for dysuria, frequency, hematuria and difficulty urinating.  Musculoskeletal: Negative for back pain.  Skin: Negative for rash.  Neurological: Negative for seizures and headaches.  Hematological: Negative.   Psychiatric/Behavioral: Negative for hallucinations.  All other systems reviewed and are negative.    Allergies  Review of patient's allergies indicates no known allergies.  Home Medications   Current Outpatient Rx  Name Route  Sig Dispense Refill  . DEXLANSOPRAZOLE 60 MG PO CPDR Oral Take 1 capsule (60 mg total) by mouth daily. 31 capsule 0    Triage Vitals: BP 124/84  Pulse 90  Temp 97.9 F (36.6 C) (Oral)  Resp 16  Ht 5\' 6"  (1.676 m)  Wt 175 lb (79.379 kg)  BMI 28.25 kg/m2  SpO2 96%  LMP 09/11/2012  Physical Exam  Nursing note and vitals reviewed. Constitutional: She is oriented to person, place, and time. She appears well-developed.  HENT:  Head: Normocephalic and atraumatic.  Eyes: Conjunctivae normal and EOM are normal. No scleral icterus.  Neck: Neck supple. No thyromegaly present.  Cardiovascular: Normal rate and regular rhythm.  Exam reveals no gallop and no friction rub.   No murmur heard. Pulmonary/Chest: No stridor. She has no wheezes. She has no rales. She exhibits no tenderness.  Abdominal: Soft. She exhibits no distension. There is no tenderness. There is no rebound.       Mild RLQ and LLQ tenderness.  Musculoskeletal: Normal range of motion. She exhibits no edema.  Lymphadenopathy:    She has no cervical adenopathy.  Neurological: She is oriented to person, place, and time. Coordination normal.  Skin: No rash noted. No erythema.  Psychiatric: She has a normal mood and affect. Her behavior is normal.    ED Course  Procedures (including critical care time) DIAGNOSTIC STUDIES: Oxygen Saturation is 96% on room air, adeuqate by my interpretation.    COORDINATION OF CARE: 07:00--Medication order: Ondansetron (Zofran-ODT) disintegrating tablet 8 mg--once  07:27--I evaluated the patient and we discussed a treatment plan including IV fluids, blood work, urinalysis, and nausea medication to which the pt agreed.   07:30--Medication order: Sodium chloride 0.9% bolus 1,026mL-once  08:15--Medication order: Ondansetron (Zofran) injection 4 mg--once  08:45--I rechecked the pt who is vomiting at this time.  09:50--I rechecked the pt who is feeling improved. She reports a h/o appendectomy  and cholecystectomy. Pt consented to an abdominal CT for further testing.   Labs Reviewed - No data to display No results found.   No diagnosis found.    MDM    Spoke to dr. Lovell Sheehan and he stated pt could go home and follow up as needed  The chart was scribed for me under my direct supervision.  I personally performed the history, physical, and medical decision making and all procedures in the evaluation of this patient.Kim Lennert, MD 09/23/12 1346  Kim Lennert, MD 09/23/12 [phone]

## 2012-09-23 NOTE — ED Notes (Signed)
Pt states she woke this am with severe abd pain and states she vomited once this am and felt like it burned her throat when she vomited.

## 2012-09-23 NOTE — ED Notes (Signed)
Pt c/o increased nausea and abd pain MD advised.

## 2012-09-23 NOTE — ED Notes (Signed)
Pt vomiting currently

## 2012-09-23 NOTE — ED Notes (Signed)
Pt c/o feeling her "reflux in her throat"

## 2012-09-25 ENCOUNTER — Telehealth: Payer: Self-pay | Admitting: Gastroenterology

## 2012-09-25 NOTE — Telephone Encounter (Signed)
Pt had LMOM to make OV to further her RF of Dexilant. I tried calling patient back and had to left VM for her to return my call.

## 2012-09-26 ENCOUNTER — Encounter: Payer: Self-pay | Admitting: Gastroenterology

## 2012-09-27 ENCOUNTER — Encounter: Payer: Self-pay | Admitting: Gastroenterology

## 2012-09-27 ENCOUNTER — Ambulatory Visit (INDEPENDENT_AMBULATORY_CARE_PROVIDER_SITE_OTHER): Payer: Self-pay | Admitting: Gastroenterology

## 2012-09-27 VITALS — BP 121/78 | HR 93 | Temp 97.4°F | Ht 66.0 in | Wt 176.2 lb

## 2012-09-27 DIAGNOSIS — K219 Gastro-esophageal reflux disease without esophagitis: Secondary | ICD-10-CM

## 2012-09-27 DIAGNOSIS — Z86010 Personal history of colonic polyps: Secondary | ICD-10-CM

## 2012-09-27 NOTE — Assessment & Plan Note (Signed)
Patient will try to obtain copy of her path report from 2005 colonoscopy done in [place]. If she is unsuccessful, she'll let us know. We have attempted this several times without success.

## 2012-09-27 NOTE — Assessment & Plan Note (Addendum)
Doing well on Dexilant. It is time for her to renew her patient assistance forms. We will provide her samples in the interim. Continue anti-reflex measures.  Recent gastroenteritis lasting for less than 24 hours. Doubt bowel obstruction given the quickness of her recovery. She will call if she has any recurrent abdominal pain/cramping, vomiting.  OV one year.

## 2012-09-27 NOTE — Patient Instructions (Addendum)
Please feel patient assistance forms for Dexilant. Call with recurrent nausea/vomiting, abdominal cramping. Please try to get a copy of your path report from colonoscopy done in 2006. If you have further problems let us know. Congratulations on your quiting smoking! Office visit in one year.

## 2012-09-27 NOTE — Progress Notes (Signed)
Primary Care Physician: Alda Lea  Primary Gastroenterologist:  Jonette Eva, MD   Chief Complaint  Patient presents with  . Follow-up  . Medication Refill    HPI: Kim Fitzgerald is a 36 y.o. female here for f/u GERD. Last seen in office on 06/2011. EGD done as outlined below. She was asked to come in for followup prior to filling out patient assistance forms for Dexilant. She was seen in the ED on 09/23/12 with acute onset N/V, crampy abdominal pain. Some diarrhea. Symptoms lasted for about 24 hours. CT with questionable early small bowel obstruction but no transition point noted. She is back to normal now. No melena, brbpr. Usually BM 3 per week. Dexilant controls GERD symptoms. No dysphagia. No unintentional weight loss. Quit smoking 05/2011. Given phenergan, vicodin in ED but does not to take it.   Patient had a colonoscopy in 2005. She had random biopsies and a polyp removed but we were never able to retrieve the path report. This was done through [facility], [facility].  Current Outpatient Prescriptions  Medication Sig Dispense Refill  . dexlansoprazole (DEXILANT) 60 MG capsule Take 1 capsule (60 mg total) by mouth daily.  31 capsule  0  . HYDROcodone-acetaminophen (NORCO/VICODIN) 5-325 MG per tablet Take 1 tablet by mouth every 6 (six) hours as needed for pain.  10 tablet  0  . Multiple Vitamin (MULTIVITAMIN WITH MINERALS) TABS Take 1 tablet by mouth daily.      . promethazine (PHENERGAN) 25 MG tablet Take 1 tablet (25 mg total) by mouth every 6 (six) hours as needed for nausea.  15 tablet  0  . promethazine (PHENERGAN) 25 MG tablet Take 1 tablet (25 mg total) by mouth every 6 (six) hours as needed for nausea.  10 tablet  0    Allergies as of 09/27/2012  . (No Known Allergies)   Past Medical History  Diagnosis Date  . H. pylori infection      treated twice  . S/P colonoscopy 2006    [place]-MD w/L[place](PAC), 1 polyp-benign  . GERD (gastroesophageal  reflux disease)    Past Surgical History  Procedure Date  . Appendectomy   . Cholecystectomy     cholelithiasis  . Ovarian cyst removal 2010    laproscopic  . Esophagogastroduodenoscopy 07/16/2011    mild gastritis     ROS:  General: Negative for anorexia, weight loss, fever, chills, fatigue, weakness. ENT: Negative for hoarseness, difficulty swallowing , nasal congestion. CV: Negative for chest pain, angina, palpitations, dyspnea on exertion, peripheral edema.  Respiratory: Negative for dyspnea at rest, dyspnea on exertion, cough, sputum, wheezing.  GI: See history of present illness. GU:  Negative for dysuria, hematuria, urinary incontinence, urinary frequency, nocturnal urination.  Endo: Negative for unusual weight change.    Physical Examination:   BP 121/78  Pulse 93  Temp 97.4 F (36.3 C) (Temporal)  Ht 5\' 6"  (1.676 m)  Wt 176 lb 3.2 oz (79.924 kg)  BMI 28.44 kg/m2  LMP 09/11/2012  General: Well-nourished, well-developed in no acute distress.  Eyes: No icterus. Mouth: Oropharyngeal mucosa moist and pink , no lesions erythema or exudate. Lungs: Clear to auscultation bilaterally.  Heart: Regular rate and rhythm, no murmurs rubs or gallops.  Abdomen: Bowel sounds are normal, nontender, nondistended, no hepatosplenomegaly or masses, no abdominal bruits or hernia , no rebound or guarding.   Extremities: No lower extremity edema. No clubbing or deformities. Neuro: Alert and oriented x 4   Skin: Warm  and dry, no jaundice.   Psych: Alert and cooperative, normal mood and affect.  Labs:  Lab Results  Component Value Date   WBC 10.5 09/23/2012   HGB 14.1 09/23/2012   HCT 38.9 09/23/2012   MCV 98.0 09/23/2012   PLT 170 09/23/2012   Lab Results  Component Value Date   ALT 19 09/23/2012   AST 24 09/23/2012   ALKPHOS 77 09/23/2012   BILITOT 0.9 09/23/2012   Lab Results  Component Value Date   CREATININE 0.70 09/23/2012   BUN 5* 09/23/2012   NA 136 09/23/2012   K 3.7  09/23/2012   CL 99 09/23/2012   CO2 26 09/23/2012     Imaging Studies: Ct Abdomen Pelvis W Contrast  09/23/2012  *RADIOLOGY REPORT*  Clinical Data: Abdominal pain.  CT ABDOMEN AND PELVIS WITH CONTRAST  Technique:  Multidetector CT imaging of the abdomen and pelvis was performed following the standard protocol during bolus administration of intravenous contrast.  Contrast: OMNIPAQUE IOHEXOL 300 MG/ML  SOLN  Comparison: Multiple exams, including 09/23/2012 radiographs and seventh/05/2011 CT of the chest  Findings: Mild intrahepatic bile duct dilatation and mild extrahepatic bile duct dilatation likely represents a response to cholecystectomy.  The spleen, pancreas, and adrenal glands appear unremarkable.  The kidneys appear unremarkable, as do the proximal ureters.  The proximal jejunum appears nondilated.  However, there are fluid filled dilated loops of small bowel with progressive dilution of the contrast column in the distal jejunum and the ileum.  Most of the distal ileum is small caliber, with intermittent loops of mildly dilated fluid filled ileum.  A lead point for obstruction is not observed.  The uterus and adnexa appear unremarkable.  Trace free pelvic fluid may be physiologic.  Appendix surgically absent.  IMPRESSION:  1.  Abnormal scattered air-fluid levels in mildly dilated mid abdominal loops of small bowel, favoring a mid to distal low grade partial small bowel obstruction although a lead point for obstruction is not identified. 2.  Probable physiologic biliary dilatation related to prior cholecystectomy.   Original Report Authenticated By: Dellia Cloud, M.D.    Dg Abd Acute W/chest  09/23/2012  *RADIOLOGY REPORT*  Clinical Data: Abdominal pain.  Vomiting.  Negative urine pregnancy test.  ACUTE ABDOMEN SERIES (ABDOMEN 2 VIEW & CHEST 1 VIEW)  Comparison: 05/16/2012  Findings: Cardiomediastinal silhouette is within normal limits. The lungs are free of focal consolidations and  pleural effusions. No free intraperitoneal air beneath the diaphragm.  Supine and erect views of the abdomen show mildly dilated central small bowel loops which do contain air fluid levels on the erect view.  Findings are consistent with early or partial small bowel obstruction, given the presence of gas in the colon.  Colonic loops do not appear dilated.  Surgical clips are identified in the right upper quadrant.  IMPRESSION:  1. No evidence for acute cardiopulmonary abnormality. 2.  Partial or early small bowel obstruction.   Original Report Authenticated By: Patterson Hammersmith, M.D.

## 2012-09-27 NOTE — Progress Notes (Signed)
Faxed to PCP

## 2012-09-28 ENCOUNTER — Other Ambulatory Visit: Payer: Self-pay

## 2012-09-28 MED ORDER — DEXLANSOPRAZOLE 60 MG PO CPDR: 60.0000 mg | DELAYED_RELEASE_CAPSULE | Freq: Every day | ORAL | Status: DC

## 2012-11-01 NOTE — Progress Notes (Signed)
REVIEWED.  

## 2013-02-14 ENCOUNTER — Emergency Department (HOSPITAL_COMMUNITY)
Admission: EM | Admit: 2013-02-14 | Discharge: 2013-02-14 | Disposition: A | Discharge status: Home / Self Care | Payer: Self-pay | Attending: Emergency Medicine | Admitting: Emergency Medicine

## 2013-02-14 ENCOUNTER — Encounter (HOSPITAL_COMMUNITY): Payer: Self-pay

## 2013-02-14 DIAGNOSIS — S40021A Contusion of right upper arm, initial encounter: Secondary | ICD-10-CM

## 2013-02-14 DIAGNOSIS — Z8619 Personal history of other infectious and parasitic diseases: Secondary | ICD-10-CM | POA: Insufficient documentation

## 2013-02-14 DIAGNOSIS — Y929 Unspecified place or not applicable: Secondary | ICD-10-CM | POA: Insufficient documentation

## 2013-02-14 DIAGNOSIS — G629 Polyneuropathy, unspecified: Secondary | ICD-10-CM

## 2013-02-14 DIAGNOSIS — Y939 Activity, unspecified: Secondary | ICD-10-CM | POA: Insufficient documentation

## 2013-02-14 DIAGNOSIS — IMO0002 Reserved for concepts with insufficient information to code with codable children: Secondary | ICD-10-CM | POA: Insufficient documentation

## 2013-02-14 DIAGNOSIS — K219 Gastro-esophageal reflux disease without esophagitis: Secondary | ICD-10-CM | POA: Insufficient documentation

## 2013-02-14 DIAGNOSIS — Z79899 Other long term (current) drug therapy: Secondary | ICD-10-CM | POA: Insufficient documentation

## 2013-02-14 DIAGNOSIS — Z87891 Personal history of nicotine dependence: Secondary | ICD-10-CM | POA: Insufficient documentation

## 2013-02-14 DIAGNOSIS — G589 Mononeuropathy, unspecified: Secondary | ICD-10-CM | POA: Insufficient documentation

## 2013-02-14 DIAGNOSIS — S40029A Contusion of unspecified upper arm, initial encounter: Secondary | ICD-10-CM | POA: Insufficient documentation

## 2013-02-14 MED ORDER — PREDNISONE 20 MG PO TABS
40.0000 mg | ORAL_TABLET | Freq: Once | ORAL | Status: AC
  Administered 2013-02-14: 40 mg via ORAL
  Filled 2013-02-14: qty 2

## 2013-02-14 MED ORDER — PREDNISONE 10 MG PO TABS: ORAL_TABLET | ORAL | Status: DC

## 2013-02-14 MED ORDER — PREDNISONE 20 MG PO TABS: ORAL_TABLET | ORAL | Status: DC

## 2013-02-14 NOTE — ED Provider Notes (Signed)
History    This chart was scribed for [name]. Oletta Lamas, MD by Gerlean Ren, ED Scribe. This patient was seen in room APFT24/APFT24 and the patient's care was started at 3:14 PM    CSN: [id number]  Arrival date & time 02/14/13  1441   First MD Initiated Contact with Patient 02/14/13 1510      Chief Complaint  Patient presents with  . Arm Pain     The history is provided by the patient. No language interpreter was used.  Kim Fitzgerald is a 37 y.o. female who presents to the Emergency Department complaining of constant right elbow pain with sudden onset after being hit by a malfunctioning sliding automatic door.  Pain is mild and radiates down right forearm.  Pt reports normal ROM of elbow, wrist, and fingers.  No further injuries or pain as a result.  Past Medical History  Diagnosis Date  . H. pylori infection      treated twice  . S/P colonoscopy 2006    [place]-MD w/Luk[place](PAC), 1 polyp-benign  . GERD (gastroesophageal reflux disease)     Past Surgical History  Procedure Laterality Date  . Appendectomy    . Cholecystectomy      cholelithiasis  . Ovarian cyst removal  2010    laproscopic  . Esophagogastroduodenoscopy  07/16/2011    mild gastritis    Family History  Problem Relation Age of Onset  . Crohn's disease Brother 45  . Alcohol abuse Father   . Heart failure Mother     History  Substance Use Topics  . Smoking status: Former Smoker -- 1.00 packs/day for 12 years    Types: Cigarettes    Quit date: 07/12/2011  . Smokeless tobacco: Not on file  . Alcohol Use: No    No OB history provided.   Review of Systems  Musculoskeletal:       Right arm pain  Skin: Negative for wound.    Allergies  Review of patient's allergies indicates no known allergies.  Home Medications   Current Outpatient Rx  Name  Route  Sig  Dispense  Refill  . dexlansoprazole (DEXILANT) 60 MG capsule   Oral   Take 1 capsule (60 mg total) by mouth daily.   30 capsule   11    . predniSONE (DELTASONE) 20 MG tablet      Take 2 tablets by mouth daily for 3 days, then 1 tablet by mouth daily for days 4-7   10 tablet   0   . EXPIRED: promethazine (PHENERGAN) 25 MG tablet   Oral   Take 1 tablet (25 mg total) by mouth every 6 (six) hours as needed for nausea.   10 tablet   0     BP 116/66  Pulse 81  Temp(Src) 98.2 F (36.8 C) (Oral)  Resp 18  Ht 5\' 6"  (1.676 m)  Wt 172 lb (78.019 kg)  BMI 27.77 kg/m2  SpO2 97%  LMP 02/13/2013  Physical Exam  Nursing note and vitals reviewed. Constitutional: She is oriented to person, place, and time. She appears well-developed and well-nourished. No distress.  HENT:  Head: Normocephalic and atraumatic.  Eyes: EOM are normal.  Neck: Neck supple. No tracheal deviation present.  Cardiovascular: Normal rate.   Cap refill normal in right fingers, good right radial pulse  Pulmonary/Chest: Effort normal. No respiratory distress.  Musculoskeletal: Normal range of motion.  Minimally tender in distal triceps area  Neurological: She is alert and oriented to person, place, and  time.  Normal strength to median, ulnar, and radial nerve distributions of right distal extremity.  Skin: Skin is warm and dry.  Psychiatric: She has a normal mood and affect. Her behavior is normal.    ED Course  Procedures (including critical care time) DIAGNOSTIC STUDIES: Oxygen Saturation is 97% on room air, adequate by my interpretation.    COORDINATION OF CARE: 3:18 PM- Patient informed of clinical course including low dose steroids and Motrin for pain.  Pt understands medical decision-making process and agrees with plan.  1. Arm contusion, right, initial encounter   2. Neuropathy       MDM  I personally performed the services described in this documentation, which was scribed in my presence. The recorded information has been reviewed and considered.  Pt with mild contusion, no immediate pain following injury, was in use prior to  pain getting worse, not fractured.  Mild neuropathic pain that I believe is temporary.  Prednisone to facilitate healing and decrease swelling.  Pt understands instrutions, need to follo up if worse and agress with plan.         [name]. Alishea Beaudin, MD 02/14/13 1528

## 2013-02-14 NOTE — Discharge Instructions (Signed)
Peripheral Nerve Problems Peripheral nerve disorders are problems with the nerves in your arms or legs. CAUSES  There are many different causes of these disorders. They include:  Injury.  Diabetes.  Chronic alcoholism.  Toxic chemicals and drugs.  Vitamin deficiencies.  Tumors.  Liver or kidney diseases. SYMPTOMS  Some of the problems caused are:  Tingling, burning, pain, and numbness in the extremities and feet.  Weakness, loss of muscle tone, and size. DIAGNOSIS  Sometimes blood tests and studies to examine nerve function are needed to make the diagnosis.  TREATMENT  Sometimes peripheral nerve problems can be treated with vitamins, medication, and avoiding known toxins such as alcohol. Please make a follow-up appointment to be sure you are getting better with treatment.  SEEK MEDICAL CARE IF:   You are not better after one week of treatment.  You have worsening of problems or have breathing trouble. Document Released: 01/13/2005 Document Revised: 02/28/2012 Document Reviewed: 12/06/2005 [facility] Patient Information 2013 [place], [place].

## 2013-02-14 NOTE — ED Notes (Signed)
Pt reports automatic door at [employer] malfunctioned and hit her r arm.  C/O pain in elbow radiating down r arm.

## 2013-04-10 ENCOUNTER — Emergency Department (HOSPITAL_COMMUNITY)
Admission: EM | Admit: 2013-04-10 | Discharge: 2013-04-10 | Disposition: A | Discharge status: Home / Self Care | Payer: Self-pay | Attending: Emergency Medicine | Admitting: Emergency Medicine

## 2013-04-10 ENCOUNTER — Emergency Department (HOSPITAL_COMMUNITY): Payer: Self-pay

## 2013-04-10 ENCOUNTER — Encounter (HOSPITAL_COMMUNITY): Payer: Self-pay | Admitting: *Deleted

## 2013-04-10 DIAGNOSIS — Z9089 Acquired absence of other organs: Secondary | ICD-10-CM | POA: Insufficient documentation

## 2013-04-10 DIAGNOSIS — Z79899 Other long term (current) drug therapy: Secondary | ICD-10-CM | POA: Insufficient documentation

## 2013-04-10 DIAGNOSIS — Z87891 Personal history of nicotine dependence: Secondary | ICD-10-CM | POA: Insufficient documentation

## 2013-04-10 DIAGNOSIS — R110 Nausea: Secondary | ICD-10-CM | POA: Insufficient documentation

## 2013-04-10 DIAGNOSIS — Z3202 Encounter for pregnancy test, result negative: Secondary | ICD-10-CM | POA: Insufficient documentation

## 2013-04-10 DIAGNOSIS — K219 Gastro-esophageal reflux disease without esophagitis: Secondary | ICD-10-CM | POA: Insufficient documentation

## 2013-04-10 DIAGNOSIS — R109 Unspecified abdominal pain: Secondary | ICD-10-CM

## 2013-04-10 DIAGNOSIS — R1031 Right lower quadrant pain: Secondary | ICD-10-CM | POA: Insufficient documentation

## 2013-04-10 DIAGNOSIS — Z8619 Personal history of other infectious and parasitic diseases: Secondary | ICD-10-CM | POA: Insufficient documentation

## 2013-04-10 LAB — CBC WITH DIFFERENTIAL/PLATELET
Eosinophils Absolute: 0.2 10*3/uL (ref 0.0–0.7)
Eosinophils Relative: 5 % (ref 0–5)
HCT: 33.3 % — ABNORMAL LOW (ref 36.0–46.0)
Hemoglobin: 12.1 g/dL (ref 12.0–15.0)
Lymphs Abs: 1.6 10*3/uL (ref 0.7–4.0)
MCH: 35.9 pg — ABNORMAL HIGH (ref 26.0–34.0)
MCV: 98.8 fL (ref 78.0–100.0)
Monocytes Absolute: 0.2 10*3/uL (ref 0.1–1.0)
Monocytes Relative: 6 % (ref 3–12)
RBC: 3.37 MIL/uL — ABNORMAL LOW (ref 3.87–5.11)

## 2013-04-10 LAB — COMPREHENSIVE METABOLIC PANEL
ALT: 12 U/L (ref 0–35)
AST: 17 U/L (ref 0–37)
Albumin: 3.7 g/dL (ref 3.5–5.2)
Alkaline Phosphatase: 57 U/L (ref 39–117)
BUN: 5 mg/dL — ABNORMAL LOW (ref 6–23)
CO2: 28 mEq/L (ref 19–32)
Calcium: 9 mg/dL (ref 8.4–10.5)
Chloride: 100 mEq/L (ref 96–112)
Creatinine, Ser: 0.69 mg/dL (ref 0.50–1.10)
GFR calc Af Amer: >90 mL/min (ref >90)
GFR calc non Af Amer: >90 mL/min (ref >90)
Glucose, Bld: 103 mg/dL — ABNORMAL HIGH (ref 70–99)
Potassium: 3.7 mEq/L (ref 3.5–5.1)
Sodium: 135 mEq/L (ref 135–145)
Total Bilirubin: 0.4 mg/dL (ref 0.3–1.2)
Total Protein: 7.7 g/dL (ref 6.0–8.3)

## 2013-04-10 LAB — URINALYSIS, ROUTINE W REFLEX MICROSCOPIC
Bilirubin Urine: NEGATIVE
Glucose, UA: NEGATIVE mg/dL
Hgb urine dipstick: NEGATIVE
Ketones, ur: NEGATIVE mg/dL
Nitrite: NEGATIVE
Protein, ur: NEGATIVE mg/dL
Specific Gravity, Urine: <1.005 — ABNORMAL LOW (ref 1.005–1.030)
Urobilinogen, UA: 0.2 mg/dL (ref 0.0–1.0)
pH: 6 (ref 5.0–8.0)

## 2013-04-10 LAB — PREGNANCY, URINE: Preg Test, Ur: NEGATIVE

## 2013-04-10 LAB — URINE MICROSCOPIC-ADD ON

## 2013-04-10 MED ORDER — MORPHINE SULFATE 4 MG/ML IJ SOLN
4.0000 mg | Freq: Once | INTRAMUSCULAR | Status: AC
  Administered 2013-04-10: 4 mg via INTRAVENOUS
  Filled 2013-04-10: qty 1

## 2013-04-10 MED ORDER — ONDANSETRON HCL 4 MG/2ML IJ SOLN
4.0000 mg | Freq: Once | INTRAMUSCULAR | Status: AC
  Administered 2013-04-10: 4 mg via INTRAVENOUS
  Filled 2013-04-10: qty 2

## 2013-04-10 MED ORDER — HYDROCODONE-ACETAMINOPHEN 5-325 MG PO TABS: 2.0000 | ORAL_TABLET | ORAL | Status: DC | PRN

## 2013-04-10 MED ORDER — SODIUM CHLORIDE 0.9 % IV BOLUS (SEPSIS)
1000.0000 mL | Freq: Once | INTRAVENOUS | Status: AC
  Administered 2013-04-10: 1000 mL via INTRAVENOUS

## 2013-04-10 NOTE — ED Provider Notes (Signed)
History  This chart was scribed for Geoffery Lyons, MD by Ardeen Jourdain, ED Scribe. This patient was seen in room APA12/APA12 and the patient's care was started at 1318.  CSN: [id number]  Arrival date & time 04/10/13  1251   First MD Initiated Contact with Patient 04/10/13 1318      Chief Complaint  Patient presents with  . Abdominal Pain  . Nausea     The history is provided by the patient. No language interpreter was used.    Kim Fitzgerald is a 37 y.o. female who presents to the Emergency Department complaining of gradual onset, gradually worsening, constant lower abdominal pain with associated nausea that began 3 days ago. She describes the non-radiating pain as sharp and as if something is torn. She denies any constipation, diarrhea, emesis, hematuria or dysuria. She states her BMs are not regular but are occuring. She states this is normal. She denies any changes in her menstrual cycle. Pt states she was seen 3 months ago for similar pain and was diagnosed with a blockage.   Past Medical History  Diagnosis Date  . H. pylori infection      treated twice  . S/P colonoscopy 2006    [place]-[facility] w/[place](PAC), 1 polyp-benign  . GERD (gastroesophageal reflux disease)     Past Surgical History  Procedure Laterality Date  . Appendectomy    . Cholecystectomy      cholelithiasis  . Ovarian cyst removal  2010    laproscopic  . Esophagogastroduodenoscopy  07/16/2011    mild gastritis    Family History  Problem Relation Age of Onset  . Crohn's disease Brother 101  . Alcohol abuse Father   . Heart failure Mother     History  Substance Use Topics  . Smoking status: Former Smoker -- 1.00 packs/day for 12 years    Types: Cigarettes    Quit date: 07/12/2011  . Smokeless tobacco: Not on file  . Alcohol Use: No   No OB history available.   Review of Systems  Constitutional: Negative for fever and chills.  Respiratory: Negative for shortness of breath.    Gastrointestinal: Positive for nausea and abdominal pain. Negative for vomiting.  Genitourinary: Negative for dysuria, hematuria, vaginal bleeding and vaginal discharge.  Neurological: Negative for weakness.  All other systems reviewed and are negative.    Allergies  Review of patient's allergies indicates no known allergies.  Home Medications   Current Outpatient Rx  Name  Route  Sig  Dispense  Refill  . dexlansoprazole (DEXILANT) 60 MG capsule   Oral   Take 1 capsule (60 mg total) by mouth daily.   30 capsule   11   . predniSONE (DELTASONE) 20 MG tablet      Take 2 tablets by mouth daily for 3 days, then 1 tablet by mouth daily for days 4-7   10 tablet   0   . EXPIRED: promethazine (PHENERGAN) 25 MG tablet   Oral   Take 1 tablet (25 mg total) by mouth every 6 (six) hours as needed for nausea.   10 tablet   0     Triage Vitals: BP 107/70  Pulse 77  Temp(Src) 97.9 F (36.6 C) (Oral)  Resp 18  Ht 5\' 6"  (1.676 m)  Wt 172 lb (78.019 kg)  BMI 27.77 kg/m2  SpO2 100%  LMP 03/27/2013  Physical Exam  Nursing note and vitals reviewed. Constitutional: She is oriented to person, place, and time. She appears well-developed and well-nourished.  No distress.  HENT:  Head: Normocephalic and atraumatic.  Eyes: EOM are normal. Pupils are equal, round, and reactive to light.  Neck: Normal range of motion. Neck supple. No tracheal deviation present.  Cardiovascular: Normal rate, regular rhythm and normal heart sounds.  Exam reveals no gallop and no friction rub.   No murmur heard. Pulmonary/Chest: Effort normal and breath sounds normal. No respiratory distress. She has no wheezes. She has no rales. She exhibits no tenderness.  Abdominal: Soft. Bowel sounds are normal. She exhibits no distension and no mass. There is tenderness. There is no rebound and no guarding.  TTP in RLQ.   Musculoskeletal: Normal range of motion. She exhibits no edema.  Neurological: She is alert and  oriented to person, place, and time.  Skin: Skin is warm and dry. She is not diaphoretic.  Psychiatric: She has a normal mood and affect. Her behavior is normal.    ED Course  Procedures (including critical care time)  DIAGNOSTIC STUDIES: Oxygen Saturation is 100% on room air, normal by my interpretation.    COORDINATION OF CARE:  1:29 PM-Discussed treatment plan which includes UA and pregnancy test with pt at bedside and pt agreed to plan.    Labs Reviewed  URINALYSIS, ROUTINE W REFLEX MICROSCOPIC  PREGNANCY, URINE   No results found.   No diagnosis found.    MDM  The labs and urine are unremarkable and pregnancy test is negative.  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  I am unsure of the exact cause of her symptoms, however I have been unable to identify anything emergent.  She appears comfortable and I believe is stable for discharge.   I will prescribe pain meds and provide her with the contact information for the GI physician on call.      I personally performed the services described in this documentation, which was scribed in my presence. The recorded information has been reviewed and is accurate.      Geoffery Lyons, MD 04/10/13 [phone]

## 2013-04-10 NOTE — ED Notes (Signed)
Pt with abd pain since 3 days ago, yesterday with nausea, denies diarrhea or emesis; denies burning on urination

## 2013-04-10 NOTE — ED Notes (Addendum)
Pt states that the pain is starting to return, Dr. Judd Lien notified, additional orders given

## 2013-04-10 NOTE — ED Notes (Signed)
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 Dr Judd Lien in prior to RN, see edp assessment for further.

## 2013-04-10 NOTE — Discharge Instructions (Signed)
Abdominal Pain  Abdominal pain can be caused by many things. Your caregiver decides the seriousness of your pain by an examination and possibly blood tests and X-rays. Many cases can be observed and treated at home. Most abdominal pain is not caused by a disease and will probably improve without treatment. However, in many cases, more time must pass before a clear cause of the pain can be found. Before that point, it may not be known if you need more testing, or if hospitalization or surgery is needed.  HOME CARE INSTRUCTIONS   · Do not take laxatives unless directed by your caregiver.  · Take pain medicine only as directed by your caregiver.  · Only take over-the-counter or prescription medicines for pain, discomfort, or fever as directed by your caregiver.  · Try a clear liquid diet (broth, tea, or water) for as long as directed by your caregiver. Slowly move to a bland diet as tolerated.  SEEK IMMEDIATE MEDICAL CARE IF:   · The pain does not go away.  · You have a fever.  · You keep throwing up (vomiting).  · The pain is felt only in portions of the abdomen. Pain in the right side could possibly be appendicitis. In an adult, pain in the left lower portion of the abdomen could be colitis or diverticulitis.  · You pass bloody or black tarry stools.  MAKE SURE YOU:   · Understand these instructions.  · Will watch your condition.  · Will get help right away if you are not doing well or get worse.  Document Released: 09/15/2005 Document Revised: 02/28/2012 Document Reviewed: 07/24/2008  [employer]® Patient Information ©2013 [employer].

## 2013-09-18 ENCOUNTER — Encounter: Payer: Self-pay | Admitting: Gastroenterology

## 2013-10-08 ENCOUNTER — Telehealth: Payer: Self-pay | Admitting: Gastroenterology

## 2013-10-08 MED ORDER — DEXLANSOPRAZOLE 60 MG PO CPDR: 60.0000 mg | DELAYED_RELEASE_CAPSULE | Freq: Every day | ORAL | Status: DC

## 2013-10-08 NOTE — Telephone Encounter (Signed)
Pt stopped by to make OV with SF and is aware that we are out of Dexilant samples. She asked if we could call in just a few to hold her over until her OV on 11/6 with SF. She uses [facility] in [place].

## 2013-10-08 NOTE — Telephone Encounter (Signed)
Routing to [facility] refill 

## 2013-10-08 NOTE — Addendum Note (Signed)
Addended by: Nira Retort on: 10/08/2013 04:51 PM   Modules accepted: Orders

## 2013-10-08 NOTE — Telephone Encounter (Signed)
Done

## 2013-10-09 ENCOUNTER — Other Ambulatory Visit: Payer: Self-pay

## 2013-10-25 ENCOUNTER — Encounter: Payer: Self-pay | Admitting: Gastroenterology

## 2013-10-25 ENCOUNTER — Ambulatory Visit (INDEPENDENT_AMBULATORY_CARE_PROVIDER_SITE_OTHER): Payer: Self-pay | Admitting: Gastroenterology

## 2013-10-25 ENCOUNTER — Encounter (INDEPENDENT_AMBULATORY_CARE_PROVIDER_SITE_OTHER): Payer: Self-pay

## 2013-10-25 VITALS — BP 118/77 | HR 108 | Temp 97.6°F | Wt 152.6 lb

## 2013-10-25 DIAGNOSIS — K219 Gastro-esophageal reflux disease without esophagitis: Secondary | ICD-10-CM

## 2013-10-25 MED ORDER — DEXLANSOPRAZOLE 60 MG PO CPDR: 60.0000 mg | DELAYED_RELEASE_CAPSULE | Freq: Every day | ORAL | Status: DC

## 2013-10-25 NOTE — Progress Notes (Signed)
  Subjective:    Patient ID: Kim Fitzgerald, female    DOB: 03/21/1976, 37 y.o.   MRN: [id number] Willow Ora, PA-C  HPI [facility]. NEEDS REFILLS. QUESTIONS ABOUT WEIGHT. 2012: 179 LBS. BMs: DAILY. STOPPED SMOKING- DEC 2012. PT DENIES FEVER, CHILLS, BRBPR, nausea, vomiting, melena, diarrhea, constipation, abd pain, problems swallowing, OR heartburn or indigestion. WORKS A [employer] OFF [address].  Past Medical History  Diagnosis Date  . H. pylori infection      treated twice  . S/P colonoscopy 2006    [place]-MD w/Luk[place](PAC), 1 polyp-benign  . GERD (gastroesophageal reflux disease)    Past Surgical History  Procedure Laterality Date  . Appendectomy    . Cholecystectomy      cholelithiasis  . Ovarian cyst removal  2010    laproscopic  . Esophagogastroduodenoscopy  07/16/2011    mild gastritis   No Known Allergies  Current Outpatient Prescriptions  Medication Sig Dispense Refill  . dexlansoprazole (DEXILANT) 60 MG capsule Take 1 capsule (60 mg total) by mouth daily.    Marland Kitchen ibuprofen (ADVIL,MOTRIN) 200 MG tablet Take 400 mg by mouth every 6 (six) hours as needed for pain. FOR HA   . HYDROcodone-acetaminophen (NORCO) 5-325 MG per tablet Take 2 tablets by mouth every 4 (four) hours as needed for pain. FOR HA        Review of Systems     Objective:   Physical Exam  Vitals reviewed. Constitutional: She is oriented to person, place, and time. She appears well-nourished. No distress.  HENT:  Head: Normocephalic and atraumatic.  Mouth/Throat: Oropharynx is clear and moist. No oropharyngeal exudate.  FEW DENTAL CARIES  Eyes: Pupils are equal, round, and reactive to light. No scleral icterus.  Neck: Normal range of motion. Neck supple.  Cardiovascular: Normal rate, regular rhythm and normal heart sounds.   Pulmonary/Chest: Effort normal and breath sounds normal. No respiratory distress.  Abdominal: Soft. Bowel sounds are normal. She exhibits no  distension. There is no tenderness.  Musculoskeletal: She exhibits no edema.  Lymphadenopathy:    She has no cervical adenopathy.  Neurological: She is alert and oriented to person, place, and time.  NO FOCAL DEFICITS   Psychiatric: She has a normal mood and affect.          Assessment & Plan:

## 2013-10-25 NOTE — Assessment & Plan Note (Signed)
SX CONTROLLED.  MAINTAIN BMI 20-25. DISCUSSED WITH PT. LOW FAT DIET DEXILANT DAILY. #15 GIVEN. OPV IN 1 YEAR

## 2013-10-25 NOTE — Patient Instructions (Signed)
MAINTAIN WEIGHT TO STAY WITHIN A BODY MASS INDEX OF 20 TO 25. YOU CAN CALCUKLTE YOUR BODY MASS INDEX ON THE INTERNET.  FOLLOW A LOW FAT DIET. SEE INFO BELOW.  TAKE DEXILANT DAILY.   FOLLOW UP IN 1 YEAR. MERRY CHRISTMAS AND HAPPY NEW YEAR!  Low-Fat Diet BREADS, CEREALS, PASTA, RICE, DRIED PEAS, AND BEANS These products are high in carbohydrates and most are low in fat. Therefore, they can be increased in the diet as substitutes for fatty foods. They too, however, contain calories and should not be eaten in excess. Cereals can be eaten for snacks as well as for breakfast.   FRUITS AND VEGETABLES It is good to eat fruits and vegetables. Besides being sources of fiber, both are rich in vitamins and some minerals. They help you get the daily allowances of these nutrients. Fruits and vegetables can be used for snacks and desserts.  MEATS Limit lean meat, chicken, [place], and fish to no more than 6 ounces per day. Beef, Pork, and Lamb Use lean cuts of beef, pork, and lamb. Lean cuts include:  Extra-lean ground beef.  Arm roast.  Sirloin tip.  Center-cut ham.  Round steak.  Loin chops.  Rump roast.  Tenderloin.  Trim all fat off the outside of meats before cooking. It is not necessary to severely decrease the intake of red meat, but lean choices should be made. Lean meat is rich in protein and contains a highly absorbable form of iron. Premenopausal women, in particular, should avoid reducing lean red meat because this could increase the risk for low red blood cells (iron-deficiency anemia).  Chicken and [place] These are good sources of protein. The fat of poultry can be reduced by removing the skin and underlying fat layers before cooking. Chicken and [place] can be substituted for lean red meat in the diet. Poultry should not be fried or covered with high-fat sauces. Fish and Shellfish Fish is a good source of protein. Shellfish contain cholesterol, but they usually are low in saturated  fatty acids. The preparation of fish is important. Like chicken and [place], they should not be fried or covered with high-fat sauces. EGGS Egg whites contain no fat or cholesterol. They can be eaten often. Try 1 to 2 egg whites instead of whole eggs in recipes or use egg substitutes that do not contain yolk. MILK AND DAIRY PRODUCTS Use skim or 1% milk instead of 2% or whole milk. Decrease whole milk, natural, and processed cheeses. Use nonfat or low-fat (2%) cottage cheese or low-fat cheeses made from vegetable oils. Choose nonfat or low-fat (1 to 2%) yogurt. Experiment with evaporated skim milk in recipes that call for heavy cream. Substitute low-fat yogurt or low-fat cottage cheese for sour cream in dips and salad dressings. Have at least 2 servings of low-fat dairy products, such as 2 glasses of skim (or 1%) milk each day to help get your daily calcium intake. FATS AND OILS Reduce the total intake of fats, especially saturated fat. Butterfat, lard, and beef fats are high in saturated fat and cholesterol. These should be avoided as much as possible. Vegetable fats do not contain cholesterol, but certain vegetable fats, such as coconut oil, palm oil, and palm kernel oil are very high in saturated fats. These should be limited. These fats are often used in bakery goods, processed foods, popcorn, oils, and nondairy creamers. Vegetable shortenings and some peanut butters contain hydrogenated oils, which are also saturated fats. Read the labels on these foods and check  for saturated vegetable oils. Unsaturated vegetable oils and fats do not raise blood cholesterol. However, they should be limited because they are fats and are high in calories. Total fat should still be limited to 30% of your daily caloric intake. Desirable liquid vegetable oils are corn oil, cottonseed oil, olive oil, canola oil, safflower oil, soybean oil, and sunflower oil. Peanut oil is not as good, but small amounts are acceptable. Buy a  heart-healthy tub margarine that has no partially hydrogenated oils in the ingredients. Mayonnaise and salad dressings often are made from unsaturated fats, but they should also be limited because of their high calorie and fat content. Seeds, nuts, peanut butter, olives, and avocados are high in fat, but the fat is mainly the unsaturated type. These foods should be limited mainly to avoid excess calories and fat. OTHER EATING TIPS Snacks  Most sweets should be limited as snacks. They tend to be rich in calories and fats, and their caloric content outweighs their nutritional value. Some good choices in snacks are graham crackers, melba toast, soda crackers, bagels (no egg), English muffins, fruits, and vegetables. These snacks are preferable to snack crackers, [place] fries, TORTILLA CHIPS, and POTATO chips. Popcorn should be air-popped or cooked in small amounts of liquid vegetable oil. Desserts Eat fruit, low-fat yogurt, and fruit ices instead of pastries, cake, and cookies. Sherbet, angel food cake, gelatin dessert, frozen low-fat yogurt, or other frozen products that do not contain saturated fat (pure fruit juice bars, frozen ice pops) are also acceptable.  COOKING METHODS Choose those methods that use little or no fat. They include: Poaching.  Braising.  Steaming.  Grilling.  Baking.  Stir-frying.  Broiling.  Microwaving.  Foods can be cooked in a nonstick pan without added fat, or use a nonfat cooking spray in regular cookware. Limit fried foods and avoid frying in saturated fat. Add moisture to lean meats by using water, broth, cooking wines, and other nonfat or low-fat sauces along with the cooking methods mentioned above. Soups and stews should be chilled after cooking. The fat that forms on top after a few hours in the refrigerator should be skimmed off. When preparing meals, avoid using excess salt. Salt can contribute to raising blood pressure in some people.  EATING AWAY FROM  HOME Order entres, potatoes, and vegetables without sauces or butter. When meat exceeds the size of a deck of cards (3 to 4 ounces), the rest can be taken home for another meal. Choose vegetable or fruit salads and ask for low-calorie salad dressings to be served on the side. Use dressings sparingly. Limit high-fat toppings, such as bacon, crumbled eggs, cheese, sunflower seeds, and olives. Ask for heart-healthy tub margarine instead of butter.

## 2013-10-29 NOTE — Progress Notes (Signed)
cc'd to pcp 

## 2013-11-10 ENCOUNTER — Emergency Department (HOSPITAL_COMMUNITY)
Admission: EM | Admit: 2013-11-10 | Discharge: 2013-11-10 | Disposition: A | Discharge status: Home / Self Care | Payer: Self-pay | Attending: Emergency Medicine | Admitting: Emergency Medicine

## 2013-11-10 ENCOUNTER — Encounter (HOSPITAL_COMMUNITY): Payer: Self-pay | Admitting: Emergency Medicine

## 2013-11-10 DIAGNOSIS — Z79899 Other long term (current) drug therapy: Secondary | ICD-10-CM | POA: Insufficient documentation

## 2013-11-10 DIAGNOSIS — Z87891 Personal history of nicotine dependence: Secondary | ICD-10-CM | POA: Insufficient documentation

## 2013-11-10 DIAGNOSIS — K047 Periapical abscess without sinus: Secondary | ICD-10-CM | POA: Insufficient documentation

## 2013-11-10 DIAGNOSIS — K0889 Other specified disorders of teeth and supporting structures: Secondary | ICD-10-CM

## 2013-11-10 DIAGNOSIS — K219 Gastro-esophageal reflux disease without esophagitis: Secondary | ICD-10-CM | POA: Insufficient documentation

## 2013-11-10 DIAGNOSIS — Z8619 Personal history of other infectious and parasitic diseases: Secondary | ICD-10-CM | POA: Insufficient documentation

## 2013-11-10 DIAGNOSIS — R109 Unspecified abdominal pain: Secondary | ICD-10-CM | POA: Insufficient documentation

## 2013-11-10 MED ORDER — CEFTRIAXONE SODIUM 1 G IJ SOLR
1.0000 g | Freq: Once | INTRAMUSCULAR | Status: AC
  Administered 2013-11-10: 1 g via INTRAMUSCULAR
  Filled 2013-11-10: qty 10

## 2013-11-10 MED ORDER — OXYCODONE-ACETAMINOPHEN 5-325 MG PO TABS: 1.0000 | ORAL_TABLET | Freq: Four times a day (QID) | ORAL | Status: DC | PRN

## 2013-11-10 MED ORDER — OXYCODONE-ACETAMINOPHEN 5-325 MG PO TABS
1.0000 | ORAL_TABLET | Freq: Once | ORAL | Status: AC
  Administered 2013-11-10: 1 via ORAL
  Filled 2013-11-10: qty 1

## 2013-11-10 MED ORDER — ONDANSETRON HCL 4 MG PO TABS
4.0000 mg | ORAL_TABLET | Freq: Once | ORAL | Status: AC
  Administered 2013-11-10: 4 mg via ORAL
  Filled 2013-11-10: qty 1

## 2013-11-10 MED ORDER — LIDOCAINE HCL (PF) 1 % IJ SOLN
INTRAMUSCULAR | Status: AC
  Administered 2013-11-10: 2.1 mL
  Filled 2013-11-10: qty 5

## 2013-11-10 MED ORDER — KETOROLAC TROMETHAMINE 60 MG/2ML IM SOLN
60.0000 mg | Freq: Once | INTRAMUSCULAR | Status: AC
  Administered 2013-11-10: 60 mg via INTRAMUSCULAR
  Filled 2013-11-10: qty 2

## 2013-11-10 NOTE — ED Provider Notes (Signed)
CSN: [id number]     Arrival date & time 11/10/13  1948 History   First MD Initiated Contact with Patient 11/10/13 2019     Chief Complaint  Patient presents with  . Dental Pain   (Consider location/radiation/quality/duration/timing/severity/associated sxs/prior Treatment) HPI Comments: Patient is a 37 year old female who presents to the emergency department with complaint of dental pain. Patient states she was seen by her dentist on Thursday, November 20. She was told she had extensive dental problems. She's been referred to an oral surgeon. The patient was treated with penicillin, naproxen, and Tylenol with codeine #3. The patient states that the pain is getting progressively worse patient states she was also told she had a fairly extensive abscess on the left side. No reported fever. No difficulty with swallowing. No difficulty with speech.  Patient is a 37 y.o. female presenting with tooth pain. The history is provided by the patient.  Dental Pain Associated symptoms: no neck pain     Past Medical History  Diagnosis Date  . H. pylori infection      treated twice  . S/P colonoscopy 2006    [place]-[facility] w/Lukasik(PAC), 1 polyp-benign  . GERD (gastroesophageal reflux disease)    Past Surgical History  Procedure Laterality Date  . Appendectomy    . Cholecystectomy      cholelithiasis  . Ovarian cyst removal  2010    laproscopic  . Esophagogastroduodenoscopy  07/16/2011    mild gastritis   Family History  Problem Relation Age of Onset  . Crohn's disease Brother 46  . Alcohol abuse Father   . Heart failure Mother    History  Substance Use Topics  . Smoking status: Former Smoker -- 1.00 packs/day for 12 years    Types: Cigarettes    Quit date: 07/12/2011  . Smokeless tobacco: Not on file  . Alcohol Use: No   OB History   Grav Para Term Preterm Abortions TAB SAB Ect Mult Living                 Review of Systems  Constitutional: Negative for activity change.       All  ROS Neg except as noted in HPI  HENT: Positive for dental problem. Negative for nosebleeds.   Eyes: Negative for photophobia and discharge.  Respiratory: Negative for cough, shortness of breath and wheezing.   Cardiovascular: Negative for chest pain and palpitations.  Gastrointestinal: Positive for abdominal pain. Negative for blood in stool.  Genitourinary: Negative for dysuria, frequency and hematuria.  Musculoskeletal: Negative for arthralgias, back pain and neck pain.  Skin: Negative.   Neurological: Negative for dizziness, seizures and speech difficulty.  Psychiatric/Behavioral: Negative for hallucinations and confusion.    Allergies  Review of patient's allergies indicates no known allergies.  Home Medications   Current Outpatient Rx  Name  Route  Sig  Dispense  Refill  . dexlansoprazole (DEXILANT) 60 MG capsule   Oral   Take 1 capsule (60 mg total) by mouth daily.   30 capsule   11   . ibuprofen (ADVIL,MOTRIN) 200 MG tablet   Oral   Take 800 mg by mouth every 4 (four) hours as needed. pain          BP 118/74  Pulse 92  Temp(Src) 98.3 F (36.8 C) (Oral)  Resp 20  Ht 5\' 6"  (1.676 m)  Wt 155 lb (70.308 kg)  BMI 25.03 kg/m2  SpO2 100%  LMP 10/27/2013 Physical Exam  Nursing note and vitals reviewed. Constitutional: She  is oriented to person, place, and time. She appears well-developed and well-nourished.  Non-toxic appearance.  HENT:  Head: Normocephalic.  Right Ear: Tympanic membrane and external ear normal.  Left Ear: Tympanic membrane and external ear normal.  Patient has extensive cavities involving the molars (upper) on the left side. There is also swelling around the gum of the lower molar area on the left. There is tenderness to palpation of the face on the left. I do not notice any significant swelling of the face. The airway is patent. There is no swelling under the tongue. The speech is clear and understandable.  Eyes: EOM and lids are normal. Pupils are  equal, round, and reactive to light.  Neck: Normal range of motion. Neck supple. Carotid bruit is not present.  Cardiovascular: Normal rate, regular rhythm, normal heart sounds, intact distal pulses and normal pulses.   Pulmonary/Chest: Breath sounds normal. No respiratory distress.  Abdominal: Soft. Bowel sounds are normal. There is no tenderness. There is no guarding.  Musculoskeletal: Normal range of motion.  Lymphadenopathy:       Head (right side): No submandibular adenopathy present.       Head (left side): No submandibular adenopathy present.    She has no cervical adenopathy.  Neurological: She is alert and oriented to person, place, and time. She has normal strength. No cranial nerve deficit or sensory deficit.  Skin: Skin is warm and dry.  Psychiatric: She has a normal mood and affect. Her speech is normal.    ED Course  Procedures (including critical care time) Labs Review Labs Reviewed - No data to display Imaging Review No results found.  EKG Interpretation   None       MDM  No diagnosis found. *I have reviewed nursing notes, vital signs, and all appropriate lab and imaging results for this patient.**  Patient has extensive dental problems including a documented abscess on the left side. Patient has failed outpatient therapy with Naprosyn, penicillin, and Tylenol with codeine. Patient given an injection of Rocephin and Toradol in the emergency department along with a tablet of Percocet.  Prescription for Percocet given for pain. Patient advised to stop the Tylenol with codeine for now. Patient has been using the ibuprofen in a nonprescribed pattern. Patient advised to take medications as prescribed.  Kathie Dike, PA-C 11/10/13 2050

## 2013-11-10 NOTE — ED Notes (Signed)
Pt seen dentist on Thursday and is taking penicillin and pt states pain is getting worse, taking advil and tylenol #3 and naproxen

## 2013-11-11 ENCOUNTER — Encounter (HOSPITAL_COMMUNITY): Payer: Self-pay | Admitting: Emergency Medicine

## 2013-11-11 ENCOUNTER — Emergency Department (HOSPITAL_COMMUNITY)
Admission: EM | Admit: 2013-11-11 | Discharge: 2013-11-11 | Disposition: A | Discharge status: Home / Self Care | Payer: Self-pay | Attending: Emergency Medicine | Admitting: Emergency Medicine

## 2013-11-11 DIAGNOSIS — K029 Dental caries, unspecified: Secondary | ICD-10-CM | POA: Insufficient documentation

## 2013-11-11 DIAGNOSIS — K047 Periapical abscess without sinus: Secondary | ICD-10-CM

## 2013-11-11 DIAGNOSIS — Z79899 Other long term (current) drug therapy: Secondary | ICD-10-CM | POA: Insufficient documentation

## 2013-11-11 DIAGNOSIS — Z87891 Personal history of nicotine dependence: Secondary | ICD-10-CM | POA: Insufficient documentation

## 2013-11-11 DIAGNOSIS — Z8619 Personal history of other infectious and parasitic diseases: Secondary | ICD-10-CM | POA: Insufficient documentation

## 2013-11-11 DIAGNOSIS — K219 Gastro-esophageal reflux disease without esophagitis: Secondary | ICD-10-CM | POA: Insufficient documentation

## 2013-11-11 NOTE — ED Provider Notes (Signed)
Medical screening examination/treatment/procedure(s) were performed by non-physician practitioner and as supervising physician I was immediately available for consultation/collaboration.  EKG Interpretation   None        Donnetta Hutching, MD 11/11/13 1136

## 2013-11-11 NOTE — ED Notes (Signed)
Pt here for left upper/lower dental pain. Pt seen in ED yesterday for same. Has appointment with oral surgeon 12/5.

## 2013-11-11 NOTE — ED Provider Notes (Signed)
Medical screening examination/treatment/procedure(s) were performed by non-physician practitioner and as supervising physician I was immediately available for consultation/collaboration.  EKG Interpretation   None       Cleophas Yoak, MD, FACEP   Cherl Gorney L Fairley Copher, MD 11/11/13 0038 

## 2013-11-11 NOTE — ED Provider Notes (Signed)
CSN: [id number]     Arrival date & time 11/11/13  1043 History   First MD Initiated Contact with Patient 11/11/13 1049     Chief Complaint  Patient presents with  . Dental Pain   (Consider location/radiation/quality/duration/timing/severity/associated sxs/prior Treatment) Patient is a 37 y.o. female presenting with tooth pain. The history is provided by the patient.  Dental Pain Location:  Upper and lower  Kim Fitzgerald is a 37 y.o. female who presents to the ED with dental pain. She has been to her dentist and has an appointment with an oral surgeon for extraction of several teeth that are decayed. She was also diagnosed with a dental abscess on the left upper dental area. She was treated with Penicillin and Tylenol # 3 by her dentist. She came to the ED yesterday with increased pain in the upper left gum and dental area and was treated with Rocephin injection and given Percocet and ibuprofen for pain. Today she wanted to know if there is something stronger she can take.  Past Medical History  Diagnosis Date  . H. pylori infection      treated twice  . S/P colonoscopy 2006    [place]-[facility] w/Lukasik(PAC), 1 polyp-benign  . GERD (gastroesophageal reflux disease)    Past Surgical History  Procedure Laterality Date  . Appendectomy    . Cholecystectomy      cholelithiasis  . Ovarian cyst removal  2010    laproscopic  . Esophagogastroduodenoscopy  07/16/2011    mild gastritis   Family History  Problem Relation Age of Onset  . Crohn's disease Brother 60  . Alcohol abuse Father   . Heart failure Mother    History  Substance Use Topics  . Smoking status: Former Smoker -- 1.00 packs/day for 12 years    Types: Cigarettes    Quit date: 07/12/2011  . Smokeless tobacco: Not on file  . Alcohol Use: No   OB History   Grav Para Term Preterm Abortions TAB SAB Ect Mult Living                 Review of Systems Negative except as stated in HPI  Allergies  Review of patient's  allergies indicates no known allergies.  Home Medications   Current Outpatient Rx  Name  Route  Sig  Dispense  Refill  . dexlansoprazole (DEXILANT) 60 MG capsule   Oral   Take 1 capsule (60 mg total) by mouth daily.   30 capsule   11   . ibuprofen (ADVIL,MOTRIN) 200 MG tablet   Oral   Take 800 mg by mouth every 4 (four) hours as needed. pain         . oxyCODONE-acetaminophen (PERCOCET/ROXICET) 5-325 MG per tablet   Oral   Take 1 tablet by mouth every 6 (six) hours as needed for severe pain.   15 tablet   0    BP 113/55  Pulse 101  Temp(Src) 98.4 F (36.9 C)  Resp 18  Ht 5\' 6"  (1.676 m)  Wt 150 lb (68.04 kg)  BMI 24.22 kg/m2  SpO2 100%  LMP 10/27/2013 Physical Exam  Nursing note and vitals reviewed. Constitutional: She is oriented to person, place, and time. She appears well-developed and well-nourished. No distress.  HENT:  Head: Normocephalic.  Mouth/Throat: Uvula is midline and oropharynx is clear and moist. Dental abscesses (left upper) and dental caries present.    Eyes: EOM are normal.  Neck: Neck supple.  Pulmonary/Chest: Effort normal.  Abdominal: Soft. There  is no tenderness.  Musculoskeletal: Normal range of motion.  Neurological: She is alert and oriented to person, place, and time. No cranial nerve deficit.  Skin: Skin is warm and dry.  Psychiatric: She has a normal mood and affect. Her behavior is normal.    ED Course  Procedures  MDM  37 y.o. female with dental abscess and follow up scheduled with oral surgeon. She will continue her medications and call tomorrow to see if she can been seen sooner by the oral surgeon or follow up with her dentist.  Discussed with the patient and all questioned fully answered.    Medication List    ASK your doctor about these medications       dexlansoprazole 60 MG capsule  Commonly known as:  DEXILANT  Take 1 capsule (60 mg total) by mouth daily.     ibuprofen 200 MG tablet  Commonly known as:   ADVIL,MOTRIN  Take 800 mg by mouth every 4 (four) hours as needed. pain     oxyCODONE-acetaminophen 5-325 MG per tablet  Commonly known as:  PERCOCET/ROXICET  Take 1 tablet by mouth every 6 (six) hours as needed for severe pain.         Janne Napoleon, [place] 11/11/13 1118

## 2014-08-09 ENCOUNTER — Other Ambulatory Visit: Payer: Self-pay | Admitting: Obstetrics and Gynecology

## 2014-08-13 ENCOUNTER — Encounter (HOSPITAL_COMMUNITY): Payer: Self-pay | Admitting: Pharmacist

## 2014-08-15 ENCOUNTER — Encounter (HOSPITAL_COMMUNITY)
Admission: RE | Disposition: A | Discharge status: Home / Self Care | Payer: Self-pay | Source: Ambulatory Visit | Attending: Obstetrics and Gynecology

## 2014-08-15 ENCOUNTER — Ambulatory Visit (HOSPITAL_COMMUNITY): Payer: 59 | Admitting: Certified Registered Nurse Anesthetist

## 2014-08-15 ENCOUNTER — Ambulatory Visit (HOSPITAL_COMMUNITY)
Admission: RE | Admit: 2014-08-15 | Discharge: 2014-08-15 | Disposition: A | Discharge status: Home / Self Care | Payer: 59 | Source: Ambulatory Visit | Attending: Obstetrics and Gynecology | Admitting: Obstetrics and Gynecology

## 2014-08-15 ENCOUNTER — Encounter (HOSPITAL_COMMUNITY): Payer: 59 | Admitting: Certified Registered Nurse Anesthetist

## 2014-08-15 ENCOUNTER — Encounter (HOSPITAL_COMMUNITY): Payer: Self-pay | Admitting: *Deleted

## 2014-08-15 DIAGNOSIS — N840 Polyp of corpus uteri: Secondary | ICD-10-CM | POA: Insufficient documentation

## 2014-08-15 DIAGNOSIS — E2839 Other primary ovarian failure: Secondary | ICD-10-CM | POA: Insufficient documentation

## 2014-08-15 DIAGNOSIS — F17200 Nicotine dependence, unspecified, uncomplicated: Secondary | ICD-10-CM | POA: Insufficient documentation

## 2014-08-15 DIAGNOSIS — K219 Gastro-esophageal reflux disease without esophagitis: Secondary | ICD-10-CM | POA: Diagnosis not present

## 2014-08-15 DIAGNOSIS — N859 Noninflammatory disorder of uterus, unspecified: Secondary | ICD-10-CM | POA: Diagnosis present

## 2014-08-15 DIAGNOSIS — N9489 Other specified conditions associated with female genital organs and menstrual cycle: Secondary | ICD-10-CM

## 2014-08-15 DIAGNOSIS — E288 Other ovarian dysfunction: Secondary | ICD-10-CM

## 2014-08-15 HISTORY — PX: DILATATION & CURRETTAGE/HYSTEROSCOPY WITH RESECTOCOPE: SHX5572

## 2014-08-15 LAB — CBC
HCT: 36.7 % (ref 36.0–46.0)
Hemoglobin: 13.1 g/dL (ref 12.0–15.0)
MCH: 35.4 pg — AB (ref 26.0–34.0)
MCHC: 35.7 g/dL (ref 30.0–36.0)
MCV: 99.2 fL (ref 78.0–100.0)
PLATELETS: 177 10*3/uL (ref 150–400)
RBC: 3.7 MIL/uL — ABNORMAL LOW (ref 3.87–5.11)
RDW: 11.4 % — ABNORMAL LOW (ref 11.5–15.5)
WBC: 4.2 10*3/uL (ref 4.0–10.5)

## 2014-08-15 SURGERY — DILATATION & CURETTAGE/HYSTEROSCOPY WITH RESECTOCOPE
Anesthesia: Epidural

## 2014-08-15 MED ORDER — LACTATED RINGERS IV SOLN
INTRAVENOUS | Status: DC
  Administered 2014-08-15 (×2): via INTRAVENOUS

## 2014-08-15 MED ORDER — MIDAZOLAM HCL 2 MG/2ML IJ SOLN
INTRAMUSCULAR | Status: DC | PRN
  Administered 2014-08-15: 2 mg via INTRAVENOUS

## 2014-08-15 MED ORDER — DEXAMETHASONE SODIUM PHOSPHATE 10 MG/ML IJ SOLN
INTRAMUSCULAR | Status: DC | PRN
  Administered 2014-08-15: 4 mg via INTRAVENOUS

## 2014-08-15 MED ORDER — LIDOCAINE HCL (CARDIAC) 20 MG/ML IV SOLN
INTRAVENOUS | Status: AC
  Filled 2014-08-15: qty 5

## 2014-08-15 MED ORDER — CHLOROPROCAINE HCL 1 % IJ SOLN
INTRAMUSCULAR | Status: AC
  Filled 2014-08-15: qty 30

## 2014-08-15 MED ORDER — SCOPOLAMINE 1 MG/3DAYS TD PT72
1.0000 | MEDICATED_PATCH | Freq: Once | TRANSDERMAL | Status: DC
  Administered 2014-08-15: 1.5 mg via TRANSDERMAL

## 2014-08-15 MED ORDER — OXYCODONE-ACETAMINOPHEN 5-325 MG PO TABS
ORAL_TABLET | ORAL | Status: AC
  Administered 2014-08-15: 1 via ORAL
  Filled 2014-08-15: qty 1

## 2014-08-15 MED ORDER — PROPOFOL 10 MG/ML IV BOLUS
INTRAVENOUS | Status: DC | PRN
  Administered 2014-08-15: 170 mg via INTRAVENOUS

## 2014-08-15 MED ORDER — ACETAMINOPHEN 160 MG/5ML PO SOLN
ORAL | Status: AC
  Filled 2014-08-15: qty 40.6

## 2014-08-15 MED ORDER — IBUPROFEN 800 MG PO TABS: 800.0000 mg | ORAL_TABLET | Freq: Three times a day (TID) | ORAL | Status: DC | PRN

## 2014-08-15 MED ORDER — PROPOFOL 10 MG/ML IV EMUL
INTRAVENOUS | Status: AC
  Filled 2014-08-15: qty 20

## 2014-08-15 MED ORDER — FENTANYL CITRATE 0.05 MG/ML IJ SOLN
INTRAMUSCULAR | Status: AC
  Filled 2014-08-15: qty 5

## 2014-08-15 MED ORDER — KETOROLAC TROMETHAMINE 30 MG/ML IJ SOLN
INTRAMUSCULAR | Status: AC
  Filled 2014-08-15: qty 1

## 2014-08-15 MED ORDER — SCOPOLAMINE 1 MG/3DAYS TD PT72
MEDICATED_PATCH | TRANSDERMAL | Status: AC
  Filled 2014-08-15: qty 1

## 2014-08-15 MED ORDER — ACETAMINOPHEN 160 MG/5ML PO SOLN
975.0000 mg | Freq: Once | ORAL | Status: AC
  Administered 2014-08-15: 975 mg via ORAL

## 2014-08-15 MED ORDER — FENTANYL CITRATE 0.05 MG/ML IJ SOLN
INTRAMUSCULAR | Status: AC
  Administered 2014-08-15: 50 ug via INTRAVENOUS
  Filled 2014-08-15: qty 2

## 2014-08-15 MED ORDER — FENTANYL CITRATE 0.05 MG/ML IJ SOLN
INTRAMUSCULAR | Status: DC | PRN
  Administered 2014-08-15: 50 ug via INTRAVENOUS
  Administered 2014-08-15 (×2): 25 ug via INTRAVENOUS
  Administered 2014-08-15: 50 ug via INTRAVENOUS

## 2014-08-15 MED ORDER — KETOROLAC TROMETHAMINE 30 MG/ML IJ SOLN
INTRAMUSCULAR | Status: DC | PRN
  Administered 2014-08-15: 30 mg via INTRAVENOUS
  Administered 2014-08-15: 30 mg via INTRAMUSCULAR

## 2014-08-15 MED ORDER — ONDANSETRON HCL 4 MG/2ML IJ SOLN
INTRAMUSCULAR | Status: AC
  Filled 2014-08-15: qty 2

## 2014-08-15 MED ORDER — ONDANSETRON HCL 4 MG/2ML IJ SOLN
INTRAMUSCULAR | Status: DC | PRN
  Administered 2014-08-15: 4 mg via INTRAVENOUS

## 2014-08-15 MED ORDER — FENTANYL CITRATE 0.05 MG/ML IJ SOLN
25.0000 ug | INTRAMUSCULAR | Status: DC | PRN
  Administered 2014-08-15 (×2): 50 ug via INTRAVENOUS

## 2014-08-15 MED ORDER — LIDOCAINE HCL (CARDIAC) 20 MG/ML IV SOLN
INTRAVENOUS | Status: DC | PRN
  Administered 2014-08-15: 80 mg via INTRAVENOUS

## 2014-08-15 MED ORDER — MIDAZOLAM HCL 2 MG/2ML IJ SOLN
INTRAMUSCULAR | Status: AC
  Filled 2014-08-15: qty 2

## 2014-08-15 MED ORDER — OXYCODONE-ACETAMINOPHEN 5-325 MG PO TABS
1.0000 | ORAL_TABLET | ORAL | Status: DC | PRN
  Administered 2014-08-15: 1 via ORAL

## 2014-08-15 SURGICAL SUPPLY — 24 items
ABLATOR ENDOMETRIAL MYOSURE (ABLATOR) ×2 IMPLANT
CANISTER SUCT 3000ML (MISCELLANEOUS) ×3 IMPLANT
CATH ROBINSON RED A/P 16FR (CATHETERS) ×3 IMPLANT
CLOTH BEACON ORANGE TIMEOUT ST (SAFETY) ×3 IMPLANT
CONTAINER PREFILL 10% NBF 60ML (FORM) ×6 IMPLANT
DEVICE MYOSURE LITE (MISCELLANEOUS) ×2 IMPLANT
DEVICE MYOSURE TISSUE REMOV XL (ABLATOR) ×2 IMPLANT
DRAPE HYSTEROSCOPY (DRAPE) ×3 IMPLANT
DRSG TELFA 3X8 NADH (GAUZE/BANDAGES/DRESSINGS) ×3 IMPLANT
ELECT REM PT RETURN 9FT ADLT (ELECTROSURGICAL) ×3
ELECTRODE REM PT RTRN 9FT ADLT (ELECTROSURGICAL) ×1 IMPLANT
GLOVE BIOGEL PI IND STRL 7.0 (GLOVE) ×2 IMPLANT
GLOVE BIOGEL PI INDICATOR 7.0 (GLOVE) ×4
GLOVE ECLIPSE 6.5 STRL STRAW (GLOVE) ×3 IMPLANT
GOWN STRL REUS W/TWL LRG LVL3 (GOWN DISPOSABLE) ×6 IMPLANT
LOOP ANGLED CUTTING 22FR (CUTTING LOOP) ×2 IMPLANT
PACK VAGINAL MINOR WOMEN LF (CUSTOM PROCEDURE TRAY) ×3 IMPLANT
PAD DRESSING TELFA 3X8 NADH (GAUZE/BANDAGES/DRESSINGS) ×1 IMPLANT
PAD OB MATERNITY 4.3X12.25 (PERSONAL CARE ITEMS) ×3 IMPLANT
SEAL ROD LENS SCOPE MYOSURE (ABLATOR) ×4 IMPLANT
SET TUBING HYSTEROSCOPY 2 NDL (TUBING) ×2 IMPLANT
TOWEL OR 17X24 6PK STRL BLUE (TOWEL DISPOSABLE) ×6 IMPLANT
TUBE HYSTEROSCOPY W Y-CONNECT (TUBING) ×2 IMPLANT
WATER STERILE IRR 1000ML POUR (IV SOLUTION) ×3 IMPLANT

## 2014-08-15 NOTE — Discharge Instructions (Signed)
CALL  IF TEMP>100.4, NOTHING PER VAGINA X 2 WK, CALL IF SOAKING A MAXI  PAD EVERY HOUR OR MORE FREQUENTLYDISCHARGE INSTRUCTIONS: D&C / D&E °The following instructions have been prepared to help you care for yourself upon your return home. °  °Personal hygiene: °• Use sanitary pads for vaginal drainage, not tampons. °• Shower the day after your procedure. °• NO tub baths, pools or Jacuzzis for 2-3 weeks. °• Wipe front to back after using the bathroom. ° °Activity and limitations: °• Do NOT drive or operate any equipment for 24 hours. The effects of anesthesia are still present and drowsiness may result. °• Do NOT rest in bed all day. °• Walking is encouraged. °• Walk up and down stairs slowly. °• You may resume your normal activity in one to two days or as indicated by your physician. ° °Sexual activity: NO intercourse for at least 2 weeks after the procedure, or as indicated by your physician. ° °Diet: Eat a light meal as desired this evening. You may resume your usual diet tomorrow. ° °Return to work: You may resume your work activities in one to two days or as indicated by your doctor. ° °What to expect after your surgery: Expect to have vaginal bleeding/discharge for 2-3 days and spotting for up to 10 days. It is not unusual to have soreness for up to 1-2 weeks. You may have a slight burning sensation when you urinate for the first day. Mild cramps may continue for a couple of days. You may have a regular period in 2-6 weeks. ° °Call your doctor for any of the following: °• Excessive vaginal bleeding, saturating and changing one pad every hour. °• Inability to urinate 6 hours after discharge from hospital. °• Pain not relieved by pain medication. °• Fever of 100.4° F or greater. °• Unusual vaginal discharge or odor. ° ° Call for an appointment:  ° ° °Patient’s signature: ______________________ ° °Nurse’s signature ________________________ ° °Support person's signature_______________________ ° ° ° °

## 2014-08-15 NOTE — OR Nursing (Signed)
Hysteroscopy/Myocure fluid deficit manually calculated to be 1746mL of Lactated Ringers by T. Janann Colonel, RN  and C. Rhymer, CRNA

## 2014-08-15 NOTE — Anesthesia Preprocedure Evaluation (Addendum)
Anesthesia Evaluation  Patient identified by MRN, date of birth, ID band Patient awake    Reviewed: Allergy & Precautions, H&P , Patient's Chart, lab work & pertinent test results  Airway Mallampati: II TM Distance: >3 FB Neck ROM: full    Dental  (+) Teeth Intact   Pulmonary Current Smoker,  breath sounds clear to auscultation        Cardiovascular Rhythm:regular Rate:Normal     Neuro/Psych    GI/Hepatic GERD-  Medicated,  Endo/Other    Renal/GU      Musculoskeletal   Abdominal   Peds  Hematology   Anesthesia Other Findings       Reproductive/Obstetrics (+) Pregnancy                         Anesthesia Physical Anesthesia Plan  ASA: II  Anesthesia Plan:    Post-op Pain Management:    Induction: Intravenous  Airway Management Planned: LMA  Additional Equipment:   Intra-op Plan:   Post-operative Plan:   Informed Consent: I have reviewed the patients History and Physical, chart, labs and discussed the procedure including the risks, benefits and alternatives for the proposed anesthesia with the patient or authorized representative who has indicated his/her understanding and acceptance.   Dental Advisory Given and Dental advisory given  Plan Discussed with: CRNA and Surgeon  Anesthesia Plan Comments: (Discussed GA with LMA, possible sore throat, potential need to switch to ETT, N/V, pulmonary aspiration. Questions answered. )       Anesthesia Quick Evaluation

## 2014-08-15 NOTE — Transfer of Care (Signed)
Immediate Anesthesia Transfer of Care Note  Patient: Kim Fitzgerald  Procedure(s) Performed: Procedure(s): DILATATION & CURETTAGE/HYSTEROSCOPY WITH RESECTOCOPE (N/A)  Patient Location: PACU  Anesthesia Type:General  Level of Consciousness: awake, alert , oriented and patient cooperative  Airway & Oxygen Therapy: Patient Spontanous Breathing and Patient connected to nasal cannula oxygen  Post-op Assessment: Report given to PACU RN and Post -op Vital signs reviewed and stable  Post vital signs: Reviewed and stable  Complications: No apparent anesthesia complications

## 2014-08-15 NOTE — Anesthesia Postprocedure Evaluation (Signed)
  Anesthesia Post-op Note  Patient: Kim Fitzgerald  Procedure(s) Performed: Procedure(s): DILATATION & CURETTAGE, HYSTEROSCOPY WITH myosure (N/A) Patient is awake and responsive. Pain and nausea are reasonably well controlled. Vital signs are stable and clinically acceptable. Oxygen saturation is clinically acceptable. There are no apparent anesthetic complications at this time. Patient is ready for discharge.

## 2014-08-15 NOTE — Brief Op Note (Signed)
08/15/2014  10:51 PM  PATIENT:  Kim Fitzgerald  38 y.o. female  PRE-OPERATIVE DIAGNOSIS:  Endometrial Mass, POF  POST-OPERATIVE DIAGNOSIS:  Endometrial Mass, POF  PROCEDURE:  Procedure(s): DILATATION & CURETTAGE, HYSTEROSCOPY WITH myosure (N/A)  SURGEON:  Surgeon(s) and Role:    * Bethzaida Boord A Mckenzy Salazar, MD - Primary  PHYSICIAN ASSISTANT:   ASSISTANTS: none   ANESTHESIA:   none Findings: fundal mass, nl tubal ostia EBL:  Total I/O In: 1500 [I.V.:1500] Out: 35 [Urine:30; Blood:5]  BLOOD ADMINISTERED:none  DRAINS: none   LOCAL MEDICATIONS USED:  NONE   SPECIMEN:  Source of Specimen:  emc, endom polyp ? fibroid  DISPOSITION OF SPECIMEN:  PATHOLOGY  COUNTS:  YES  TOURNIQUET:  * No tourniquets in log *  DICTATION: .Other Dictation: Dictation Number [id number]  PLAN OF CARE: Discharge to home after PACU  PATIENT DISPOSITION:  PACU - hemodynamically stable.   Delay start of Pharmacological VTE agent (>24hrs) due to surgical blood loss or risk of bleeding: no

## 2014-08-15 NOTE — H&P (Signed)
Kim Fitzgerald is an 38 y.o. female. G0 SBF presents surgical management of endometrial mass noted on sono hysterogram. Pt is recently diagnosed with premature ovarian failure. Last cycle 2 yrs ago  Pertinent Gynecological History: Menses: amenorrhea Bleeding: none Contraception: abstinence DES exposure: denies Blood transfusions: none Sexually transmitted diseases: no past history Previous GYN Procedures: none  Last mammogram: n/a Date: n/a Last pap: normal Date: 06/2014 OB History: G0P0   Menstrual History: Menarche age: n/a No LMP recorded. Patient is not currently having periods (Reason: Perimenopausal).    Past Medical History  Diagnosis Date  . H. pylori infection      treated twice  . S/P colonoscopy 2006    [place]-[facility] w/Lukasik(PAC), 1 polyp-benign  . GERD (gastroesophageal reflux disease)     Past Surgical History  Procedure Laterality Date  . Appendectomy    . Cholecystectomy      cholelithiasis  . Ovarian cyst removal  2010    laproscopic  . Esophagogastroduodenoscopy  07/16/2011    mild gastritis    Family History  Problem Relation Age of Onset  . Crohn's disease Brother 77  . Alcohol abuse Father   . Heart failure Mother     Social History:  reports that she has been smoking E-cigarettes.  She has a 12 pack-year smoking history. She does not have any smokeless tobacco history on file. She reports that she does not drink alcohol or use illicit drugs.  Allergies: No Known Allergies  Prescriptions prior to admission  Medication Sig Dispense Refill  . acetaminophen (TYLENOL) 325 MG tablet Take 325 mg by mouth every 6 (six) hours as needed.      . Black Cohosh-SoyIsoflav-Magnol (ESTROVEN MENOPAUSE RELIEF) CAPS Take 1 tablet by mouth daily.      Marland Kitchen buPROPion (WELLBUTRIN XL) 150 MG 24 hr tablet Take 150 mg by mouth daily.      Marland Kitchen gabapentin (NEURONTIN) 300 MG capsule Take 300 mg by mouth daily.      Marland Kitchen dexlansoprazole (DEXILANT) 60 MG capsule Take 1  capsule (60 mg total) by mouth daily.  30 capsule  11  . ibuprofen (ADVIL,MOTRIN) 200 MG tablet Take 400 mg by mouth every 4 (four) hours as needed. pain        Review of Systems  All other systems reviewed and are negative.   Blood pressure 130/79, pulse 97, temperature 98.1 F (36.7 C), temperature source Oral, resp. rate 16, height 5\' 6"  (1.676 m), weight 81.647 kg (180 lb), SpO2 98.00%. Physical Exam  Constitutional: She is oriented to person, place, and time. She appears well-developed and well-nourished.  HENT:  Head: Normocephalic and atraumatic.  Eyes: EOM are normal.  Neck: Neck supple.  Cardiovascular: Regular rhythm.   Respiratory: Effort normal.  GI: Soft.  Musculoskeletal: She exhibits no edema.  Neurological: She is alert and oriented to person, place, and time.  Skin: Skin is warm and dry.  Psychiatric: She has a normal mood and affect.    Results for orders placed during the hospital encounter of 08/15/14 (from the past 24 hour(s))  CBC     Status: Abnormal   Collection Time    08/15/14  9:40 AM      Result Value Ref Range   WBC 4.2  4.0 - 10.5 K/uL   RBC 3.70 (*) 3.87 - 5.11 MIL/uL   Hemoglobin 13.1  12.0 - 15.0 g/dL   HCT 36.7  36.0 - 46.0 %   MCV 99.2  78.0 - 100.0 fL  MCH 35.4 (*) 26.0 - 34.0 pg   MCHC 35.7  30.0 - 36.0 g/dL   RDW 11.4 (*) 11.5 - 15.5 %   Platelets 177  150 - 400 K/uL    No results found.  Assessment/Plan: Premature ovarian failure Endometrial mass P) dx hysteroscopy, D&C removal of endometrial mass. Risk reviewed including but not limited to infection, bleeding, uterine perforation and its risk, thermal injury, fluid overload, injury to surrounding organ structures.  All ? answered  Dilara Navarrete A 08/15/2014, 10:05 AM

## 2014-08-17 ENCOUNTER — Encounter (HOSPITAL_COMMUNITY): Payer: Self-pay | Admitting: Obstetrics and Gynecology

## 2014-08-17 NOTE — Op Note (Addendum)
Kim Fitzgerald, PITONES            ACCOUNT NO.:  [id number]  MEDICAL RECORD NO.:  [id number]  LOCATION:  [facility]                          FACILITY:  [facility]  PHYSICIAN:  Servando Salina, M.D.DATE OF BIRTH:  05-24-1976  DATE OF PROCEDURE:  08/15/2014 DATE OF DISCHARGE:  08/15/2014                              OPERATIVE REPORT   PREOPERATIVE DIAGNOSES:  Endometrial mass, premature ovarian failure.  PROCEDURE:  Diagnostic hysteroscopy, hysteroscopic resection of endometrial mass using MyoSure, D and C.  POSTOPERATIVE DIAGNOSES:  Endometrial mass, premature ovarian failure.  ANESTHESIA:  General.  SURGEON:  Servando Salina, MD.  ASSISTANT:  None.  PROCEDURE:  Under adequate general anesthesia, the patient was placed in the dorsal lithotomy position.  She was sterilely prepped and draped in usual fashion.  Bladder was catheterized for urine.  Examination under anesthesia revealed anteverted uterus.  No adnexal masses could be appreciated.  Bivalve speculum was placed in vagina.  Single-tooth tenaculum was placed on the anterior lip of the cervix.  The cervix, however, was serially dilated up to a #23 Pratt dilator.  Diagnostic hysteroscope was then introduced into the uterine cavity.  Both tubal ostia could be seen well.  At the fundal area, there was a mass protruding from it suggestive of a polyp.  The cervix was then attempted to be further dilated and could not go beyond the #25 dilator.  Given this limitation, the resectoscope which would require large dilation could not used.  Decision was then made to try the MyoSure with a suggestion that this is a polyp and would facilitate to a smaller closure.  At that time, the light MyoSure was then utilized.  Pieces of the mass was removed.  Additional dilation with 31 Kennon Rounds  was done.  The other MyoSure was then inserted and due to the fundal location it was limited again to get arounds of full size, however, the mass was resected  down to the fundal area.  Once that was felt to be adequate, the MyoSure apparatus was removed.  Cavity was then gently curetted and all instruments were then removed from the vagina.  SPECIMENS:  Endometrial curetting, thought to be endometrial polyp sent to Pathology.  ESTIMATED BLOOD LOSS:  10 mL.  COMPLICATIONS:  None.  The patient tolerated the procedure well, was transferred to the recovery room in stable condition.     Servando Salina, M.D.     [place]/MEDQ  D:  08/16/2014  T:  08/17/2014  Job:  [id number]

## 2014-10-01 ENCOUNTER — Encounter: Payer: Self-pay | Admitting: Gastroenterology

## 2014-10-20 ENCOUNTER — Emergency Department (HOSPITAL_COMMUNITY)
Admission: EM | Admit: 2014-10-20 | Discharge: 2014-10-20 | Disposition: A | Discharge status: Home / Self Care | Payer: 59 | Attending: Emergency Medicine | Admitting: Emergency Medicine

## 2014-10-20 ENCOUNTER — Emergency Department (HOSPITAL_COMMUNITY): Payer: 59

## 2014-10-20 ENCOUNTER — Encounter (HOSPITAL_COMMUNITY): Payer: Self-pay

## 2014-10-20 DIAGNOSIS — Z79899 Other long term (current) drug therapy: Secondary | ICD-10-CM | POA: Diagnosis not present

## 2014-10-20 DIAGNOSIS — K5900 Constipation, unspecified: Secondary | ICD-10-CM

## 2014-10-20 DIAGNOSIS — K219 Gastro-esophageal reflux disease without esophagitis: Secondary | ICD-10-CM | POA: Diagnosis not present

## 2014-10-20 DIAGNOSIS — Z9049 Acquired absence of other specified parts of digestive tract: Secondary | ICD-10-CM | POA: Insufficient documentation

## 2014-10-20 DIAGNOSIS — Z8619 Personal history of other infectious and parasitic diseases: Secondary | ICD-10-CM | POA: Diagnosis not present

## 2014-10-20 DIAGNOSIS — R109 Unspecified abdominal pain: Secondary | ICD-10-CM

## 2014-10-20 DIAGNOSIS — Z720 Tobacco use: Secondary | ICD-10-CM | POA: Diagnosis not present

## 2014-10-20 DIAGNOSIS — Z9889 Other specified postprocedural states: Secondary | ICD-10-CM | POA: Insufficient documentation

## 2014-10-20 DIAGNOSIS — R1084 Generalized abdominal pain: Secondary | ICD-10-CM | POA: Diagnosis present

## 2014-10-20 LAB — COMPREHENSIVE METABOLIC PANEL
ALBUMIN: 4 g/dL (ref 3.5–5.2)
ALT: 23 U/L (ref 0–35)
AST: 28 U/L (ref 0–37)
Alkaline Phosphatase: 83 U/L (ref 39–117)
Anion gap: 8 (ref 5–15)
BUN: 7 mg/dL (ref 6–23)
CO2: 31 meq/L (ref 19–32)
CREATININE: 1.05 mg/dL (ref 0.50–1.10)
Calcium: 9.3 mg/dL (ref 8.4–10.5)
Chloride: 101 mEq/L (ref 96–112)
GFR calc Af Amer: 76 mL/min — ABNORMAL LOW (ref >90)
GFR, EST NON AFRICAN AMERICAN: 66 mL/min — AB (ref >90)
Glucose, Bld: 100 mg/dL — ABNORMAL HIGH (ref 70–99)
Potassium: 4.7 mEq/L (ref 3.7–5.3)
Sodium: 140 mEq/L (ref 137–147)
Total Bilirubin: 0.4 mg/dL (ref 0.3–1.2)
Total Protein: 8.4 g/dL — ABNORMAL HIGH (ref 6.0–8.3)

## 2014-10-20 LAB — CBC WITH DIFFERENTIAL/PLATELET
BASOS ABS: 0 10*3/uL (ref 0.0–0.1)
BASOS PCT: 0 % (ref 0–1)
EOS ABS: 0.1 10*3/uL (ref 0.0–0.7)
EOS PCT: 2 % (ref 0–5)
HCT: 32.8 % — ABNORMAL LOW (ref 36.0–46.0)
Hemoglobin: 11.4 g/dL — ABNORMAL LOW (ref 12.0–15.0)
Lymphocytes Relative: 32 % (ref 12–46)
Lymphs Abs: 1.2 10*3/uL (ref 0.7–4.0)
MCH: 34.3 pg — AB (ref 26.0–34.0)
MCHC: 34.8 g/dL (ref 30.0–36.0)
MCV: 98.8 fL (ref 78.0–100.0)
Monocytes Absolute: 0.1 10*3/uL (ref 0.1–1.0)
Monocytes Relative: 4 % (ref 3–12)
NEUTROS PCT: 62 % (ref 43–77)
Neutro Abs: 2.3 10*3/uL (ref 1.7–7.7)
PLATELETS: 165 10*3/uL (ref 150–400)
RBC: 3.32 MIL/uL — ABNORMAL LOW (ref 3.87–5.11)
RDW: 11.4 % — AB (ref 11.5–15.5)
WBC: 3.6 10*3/uL — ABNORMAL LOW (ref 4.0–10.5)

## 2014-10-20 LAB — LIPASE, BLOOD: LIPASE: 39 U/L (ref 11–59)

## 2014-10-20 MED ORDER — HYDROMORPHONE HCL 1 MG/ML IJ SOLN
1.0000 mg | Freq: Once | INTRAMUSCULAR | Status: AC
  Administered 2014-10-20: 1 mg via INTRAVENOUS
  Filled 2014-10-20: qty 1

## 2014-10-20 MED ORDER — SODIUM CHLORIDE 0.9 % IV BOLUS (SEPSIS)
250.0000 mL | Freq: Once | INTRAVENOUS | Status: AC
  Administered 2014-10-20: 250 mL via INTRAVENOUS

## 2014-10-20 MED ORDER — PANTOPRAZOLE SODIUM 40 MG IV SOLR: 40.0000 mg | Freq: Once | INTRAVENOUS | Status: DC

## 2014-10-20 MED ORDER — DOCUSATE SODIUM 100 MG PO CAPS: 100.0000 mg | ORAL_CAPSULE | Freq: Two times a day (BID) | ORAL | Status: DC

## 2014-10-20 MED ORDER — IOHEXOL 300 MG/ML  SOLN
100.0000 mL | Freq: Once | INTRAMUSCULAR | Status: AC | PRN
  Administered 2014-10-20: 100 mL via INTRAVENOUS

## 2014-10-20 MED ORDER — SODIUM CHLORIDE 0.9 % IV SOLN
INTRAVENOUS | Status: DC
  Administered 2014-10-20: 10:00:00 via INTRAVENOUS

## 2014-10-20 MED ORDER — GI COCKTAIL ~~LOC~~
30.0000 mL | Freq: Once | ORAL | Status: AC
  Administered 2014-10-20: 30 mL via ORAL
  Filled 2014-10-20: qty 30

## 2014-10-20 MED ORDER — IOHEXOL 300 MG/ML  SOLN
50.0000 mL | Freq: Once | INTRAMUSCULAR | Status: AC | PRN
  Administered 2014-10-20: 50 mL via ORAL

## 2014-10-20 MED ORDER — ONDANSETRON HCL 4 MG/2ML IJ SOLN
4.0000 mg | Freq: Once | INTRAMUSCULAR | Status: AC
  Administered 2014-10-20: 4 mg via INTRAVENOUS
  Filled 2014-10-20: qty 2

## 2014-10-20 NOTE — ED Notes (Signed)
MD at bedside. 

## 2014-10-20 NOTE — ED Notes (Signed)
Pt upset and crying and asking to go home.  Removed IV and stopped IVF.

## 2014-10-20 NOTE — ED Notes (Signed)
Dr Rogene Houston informed of pt requesting to be discharged and requesting GI cocktail.  See new orders.

## 2014-10-20 NOTE — ED Provider Notes (Addendum)
CSN: [id number]     Arrival date & time 10/20/14  0536 History  This chart was scribed for Fredia Sorrow, MD by Edison Simon, ED Scribe. This patient was seen in room APA17/APA17 and the patient's care was started at 7:51 AM.    Chief Complaint  Patient presents with  . Abdominal Pain   Patient is a 38 y.o. female presenting with abdominal pain. The history is provided by the patient. No language interpreter was used.  Abdominal Pain Pain location:  Generalized Pain quality: cramping   Pain radiates to:  Back Pain severity:  Moderate Onset quality:  Sudden Duration:  7 hours Timing:  Constant Chronicity:  Recurrent Context: suspicious food intake   Relieved by:  Nothing Worsened by:  Nothing tried Ineffective treatments: Gas-X, vinegar and water, lemon and water. Associated symptoms: no chest pain, no chills, no cough, no diarrhea, no dysuria, no fever, no nausea, no shortness of breath, no sore throat and no vomiting   Risk factors: multiple surgeries   Risk factors: not elderly and not pregnant     HPI Comments: Kim Fitzgerald is a 38 y.o. female with history of GERD who presents to the Emergency Department complaining of abdominal pain with onset at 0100 today. She states the pain is diffuse and radiates to her back. She describes it as cramping and rates it at 10/10. She reports having similar pain previously and states she believes it is gas, but states she has not experienced this in a long time. She reports prior appendectomy and cholecystectomy. She states she ate a caramel peanut apple today, which she does not normally eat. She states she has tried Gas-X, vinegar and water, and lemon and water without remission. She denies nausea, vomiting, diarrhea, or dysuria. 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   Past Medical History  Diagnosis Date  . H. pylori infection      treated twice  . S/P colonoscopy 2006    [place]  w/[place](PAC), 1 polyp-benign  . GERD (gastroesophageal reflux disease)    Past Surgical History  Procedure Laterality Date  . Appendectomy    . Cholecystectomy      cholelithiasis  . Ovarian cyst removal  2010    laproscopic  . Esophagogastroduodenoscopy  07/16/2011    mild gastritis  . Dilatation & currettage/hysteroscopy with resectocope N/A 08/15/2014    Procedure: DILATATION & CURETTAGE, HYSTEROSCOPY WITH myosure;  Surgeon: Marvene Staff, MD;  Location: [facility];  Service: Gynecology;  Laterality: N/A;   Family History  Problem Relation Age of Onset  . Crohn's disease Brother 52  . Alcohol abuse Father   . Heart failure Mother    History  Substance Use Topics  . Smoking status: Current Every Day Smoker -- 1.00 packs/day for 12 years    Types: E-cigarettes    Last Attempt to Quit: 07/12/2011  . Smokeless tobacco: Not on file  . Alcohol Use: No   OB History    No data available     Review of Systems  Constitutional: Negative for fever and chills.  HENT: Negative for rhinorrhea and sore throat.   Eyes: Negative for visual disturbance.  Respiratory: Negative for cough and shortness of breath.   Cardiovascular: Negative for chest pain and leg swelling.  Gastrointestinal: Positive for abdominal pain. Negative for nausea, vomiting and diarrhea.  Genitourinary: Negative for dysuria.  Musculoskeletal: Positive for back pain. Negative for neck pain.  Skin:  Negative for rash.  Neurological: Negative for headaches.  Hematological: Does not bruise/bleed easily.  Psychiatric/Behavioral: Negative for confusion.      Allergies  Review of patient's allergies indicates no known allergies.  Home Medications   Prior to Admission medications   Medication Sig Start Date End Date Taking? Authorizing Provider  acetaminophen (TYLENOL) 325 MG tablet Take 325 mg by mouth every 6 (six) hours as needed for headache.    Yes Historical Provider, MD  buPROPion (WELLBUTRIN XL) 150  MG 24 hr tablet Take 150 mg by mouth daily.   Yes Historical Provider, MD  dexlansoprazole (DEXILANT) 60 MG capsule Take 1 capsule (60 mg total) by mouth daily. 10/25/13  Yes Danie Binder, MD  gabapentin (NEURONTIN) 300 MG capsule Take 300 mg by mouth daily.   Yes Historical Provider, MD  ibuprofen (ADVIL,MOTRIN) 200 MG tablet Take 400 mg by mouth every 4 (four) hours as needed for moderate pain. pain   Yes Historical Provider, MD  ibuprofen (ADVIL,MOTRIN) 800 MG tablet Take 1 tablet (800 mg total) by mouth every 8 (eight) hours as needed. 08/15/14  Yes Sheronette Clint Bolder, MD  Misc Natural Products (ESTROVEN ENERGY PO) Take 1 tablet by mouth daily.    Yes Historical Provider, MD  Black Cohosh-SoyIsoflav-Magnol (ESTROVEN MENOPAUSE RELIEF) CAPS Take 1 tablet by mouth daily.    Historical Provider, MD  docusate sodium (COLACE) 100 MG capsule Take 1 capsule (100 mg total) by mouth every 12 (twelve) hours. 10/20/14   Fredia Sorrow, MD   BP 108/74 mmHg  Pulse 73  Resp 15  Ht 5\' 5"  (1.651 m)  Wt 195 lb (88.451 kg)  BMI 32.45 kg/m2  SpO2 99%  LMP  Physical Exam  Constitutional: She is oriented to person, place, and time. She appears well-developed and well-nourished.  HENT:  Head: Normocephalic and atraumatic.  Eyes: Conjunctivae are normal.  Neck: Normal range of motion. Neck supple.  Cardiovascular: Normal rate, regular rhythm and normal heart sounds.   No murmur heard. Pulmonary/Chest: Effort normal and breath sounds normal. No respiratory distress. She has no wheezes. She has no rales.  Lungs clear on both sides  Abdominal: Soft. Bowel sounds are normal. She exhibits no distension. There is no tenderness. There is no rebound and no guarding.  Musculoskeletal: Normal range of motion. She exhibits no edema.  Neurological: She is alert and oriented to person, place, and time. No cranial nerve deficit. She exhibits normal muscle tone. Coordination normal.  Skin: Skin is warm and dry.   Psychiatric: She has a normal mood and affect.  Nursing note and vitals reviewed.   ED Course  Procedures (including critical care time)\  DIAGNOSTIC STUDIES: Oxygen Saturation is 98% on room air, normal by my interpretation.    COORDINATION OF CARE: 7:56 AM Discussed treatment plan with patient at beside, including CT scan of her abdomen. The patient agrees with the plan and has no further questions at this time.   Labs Review Labs Reviewed  CBC WITH DIFFERENTIAL - Abnormal; Notable for the following:    WBC 3.6 (*)    RBC 3.32 (*)    Hemoglobin 11.4 (*)    HCT 32.8 (*)    MCH 34.3 (*)    RDW 11.4 (*)    All other components within normal limits  COMPREHENSIVE METABOLIC PANEL - Abnormal; Notable for the following:    Glucose, Bld 100 (*)    Total Protein 8.4 (*)    GFR calc non Af Amer 66 (*)  GFR calc Af Amer 76 (*)    All other components within normal limits  LIPASE, BLOOD   Results for orders placed or performed during the hospital encounter of 10/20/14  CBC with Differential  Result Value Ref Range   WBC 3.6 (L) 4.0 - 10.5 K/uL   RBC 3.32 (L) 3.87 - 5.11 MIL/uL   Hemoglobin 11.4 (L) 12.0 - 15.0 g/dL   HCT 32.8 (L) 36.0 - 46.0 %   MCV 98.8 78.0 - 100.0 fL   MCH 34.3 (H) 26.0 - 34.0 pg   MCHC 34.8 30.0 - 36.0 g/dL   RDW 11.4 (L) 11.5 - 15.5 %   Platelets 165 150 - 400 K/uL   Neutrophils Relative % 62 43 - 77 %   Neutro Abs 2.3 1.7 - 7.7 K/uL   Lymphocytes Relative 32 12 - 46 %   Lymphs Abs 1.2 0.7 - 4.0 K/uL   Monocytes Relative 4 3 - 12 %   Monocytes Absolute 0.1 0.1 - 1.0 K/uL   Eosinophils Relative 2 0 - 5 %   Eosinophils Absolute 0.1 0.0 - 0.7 K/uL   Basophils Relative 0 0 - 1 %   Basophils Absolute 0.0 0.0 - 0.1 K/uL  Lipase, blood  Result Value Ref Range   Lipase 39 11 - 59 U/L  Comprehensive metabolic panel  Result Value Ref Range   Sodium 140 137 - 147 mEq/L   Potassium 4.7 3.7 - 5.3 mEq/L   Chloride 101 96 - 112 mEq/L   CO2 31 19 - 32  mEq/L   Glucose, Bld 100 (H) 70 - 99 mg/dL   BUN 7 6 - 23 mg/dL   Creatinine, Ser 1.05 0.50 - 1.10 mg/dL   Calcium 9.3 8.4 - 10.5 mg/dL   Total Protein 8.4 (H) 6.0 - 8.3 g/dL   Albumin 4.0 3.5 - 5.2 g/dL   AST 28 0 - 37 U/L   ALT 23 0 - 35 U/L   Alkaline Phosphatase 83 39 - 117 U/L   Total Bilirubin 0.4 0.3 - 1.2 mg/dL   GFR calc non Af Amer 66 (L) >90 mL/min   GFR calc Af Amer 76 (L) >90 mL/min   Anion gap 8 5 - 15     Imaging Review Ct Abdomen Pelvis W Contrast  10/20/2014   CLINICAL DATA:  Abdominal pain.  EXAM: CT ABDOMEN AND PELVIS WITH CONTRAST  TECHNIQUE: Multidetector CT imaging of the abdomen and pelvis was performed using the standard protocol following bolus administration of intravenous contrast.  CONTRAST:  40mL OMNIPAQUE IOHEXOL 300 MG/ML SOLN, 158mL OMNIPAQUE IOHEXOL 300 MG/ML SOLN  COMPARISON:  Abdomen series 10/20/2014.  CT 04/10/2013.  FINDINGS: No focal hepatic abnormality. Spleen unremarkable. Pancreas unremarkable. Cholecystectomy. Stable distention of the biliary system is noted, this is possibly: Cystectomy. Correlation with liver function tests suggested.  Adrenals normal. Kidneys are normal no hydronephrosis or obstructing ureteral stone. The bladder is nondistended. Uterus and adnexa unremarkable. No free pelvic fluid collection.  No adenopathy. Abdominal aorta is normal in caliber. Visceral vessels are patent. Portal vein patent.  No inflammatory change in the right or left lower quadrant. Appendix is unremarkable. Large amount stool is noted throughout colon suggesting constipation. No free air. Stomach is nondistended.  Heart size normal. Lung bases are clear. No acute bony abnormality.  IMPRESSION: 1.  No acute abnormality.  2. Biliary distention again noted, most likely from prior cholecystectomy. Correlation with liver function tests.   Electronically Signed   By: Marcello Moores  Register   On: 10/20/2014 11:44   Dg Abd Acute W/chest  10/20/2014   CLINICAL DATA:   Abdominal pain.  EXAM: ACUTE ABDOMEN SERIES (ABDOMEN 2 VIEW & CHEST 1 VIEW)  COMPARISON:  CT 04/10/2013.  FINDINGS: No acute cardiopulmonary disease.  Soft tissue structures evident unremarkable. Large amount of stool noted throughout the colon. No free air is identified. Surgical clips right upper quadrant. No acute bony abnormality.  IMPRESSION: 1. No acute cardiopulmonary disease. 2. Large amount of stool noted throughout the colon. This suggests constipation.   Electronically Signed   By: Marcello Moores  Register   On: 10/20/2014 09:37     EKG Interpretation None      MDM   Final diagnoses:  Abdominal pain  Constipation, unspecified constipation type    Patient with extensive workup due to the abdominal pain everything negative except for evidence of constipation no evidence of any bowel obstruction. Patient is very upset that she did not get treatment for the constipation here and get things cleared out. Recommended plenty of fluid with prescribed Colace for her to allow this gently to get resolved. Offered patient additional pain medicine but did let her know that that does tend to make the constipation worse. Patient still upset just wants to be discharged home. Is insisting on having enemas here. I do not feel that that is indicated there is no evidence of any significant impaction or bowel obstruction.patient not having any nausea or vomiting.  How patient seemed to have significant amount of crampy abdominal pain all over. Patient was given GI cocktail and also given hydromorphone for this pain. Plain films were suggestive of constipation. However patient's pain seemed to be out of proportion for the finding of the plain films. So CT scan of the abdomen was done to make sure something wasn't being missed.  I personally performed the services described in this documentation, which was scribed in my presence. The recorded information has been reviewed and is accurate.     Fredia Sorrow,  MD 10/20/14 1326  Fredia Sorrow, MD 10/20/14 [id number]

## 2014-10-20 NOTE — Discharge Instructions (Signed)
CT scan of the abdomen negative except for the constipation. Colace provided as a prescription to help with that. Make an appointment to follow-up with your doctor. Return for any new or worse symptoms. Work note provided if needed.

## 2014-10-20 NOTE — ED Notes (Signed)
Pt states she thinks she has bad gas pains, states the pain is travelling around her abd.

## 2014-10-20 NOTE — ED Notes (Signed)
Pt upset about labs and treatment ordered.  Pt is refusing treatment at this time.  Requesting to get GI cocktail and go home.

## 2014-10-20 NOTE — ED Notes (Signed)
Pain started at 1 am this am.  Tried MOM and home remedies with not relief.  Have a history of GERD and acid reflux.  Currently on Dexlant.

## 2014-10-20 NOTE — ED Notes (Signed)
Oral contrast completed x2 bottles.

## 2014-10-20 NOTE — ED Notes (Signed)
Spoke with pt.  Pt agrees to stay for treatment but still refusing CT scan.  Ok with IV being started, labs drawn and Xray of abdomen.

## 2014-10-20 NOTE — ED Notes (Signed)
Pt is c/o abdomen pain and requesting pain medication.  Dr Rogene Houston notified, see new orders.  Pt has agreed to do CT scan.

## 2014-10-21 ENCOUNTER — Other Ambulatory Visit: Payer: Self-pay

## 2014-10-22 MED ORDER — DEXLANSOPRAZOLE 60 MG PO CPDR: 60.0000 mg | DELAYED_RELEASE_CAPSULE | Freq: Every day | ORAL | Status: DC

## 2015-12-21 DIAGNOSIS — M359 Systemic involvement of connective tissue, unspecified: Secondary | ICD-10-CM

## 2015-12-21 HISTORY — DX: Systemic involvement of connective tissue, unspecified: M35.9

## 2016-02-27 ENCOUNTER — Encounter (HOSPITAL_COMMUNITY): Payer: Self-pay

## 2016-02-27 ENCOUNTER — Emergency Department (HOSPITAL_COMMUNITY): Payer: Self-pay

## 2016-02-27 ENCOUNTER — Emergency Department (HOSPITAL_COMMUNITY)
Admission: EM | Admit: 2016-02-27 | Discharge: 2016-02-28 | Disposition: A | Discharge status: Home / Self Care | Payer: Self-pay | Attending: Emergency Medicine | Admitting: Emergency Medicine

## 2016-02-27 DIAGNOSIS — R195 Other fecal abnormalities: Secondary | ICD-10-CM

## 2016-02-27 DIAGNOSIS — Z8739 Personal history of other diseases of the musculoskeletal system and connective tissue: Secondary | ICD-10-CM | POA: Insufficient documentation

## 2016-02-27 DIAGNOSIS — Z79899 Other long term (current) drug therapy: Secondary | ICD-10-CM | POA: Insufficient documentation

## 2016-02-27 DIAGNOSIS — F17210 Nicotine dependence, cigarettes, uncomplicated: Secondary | ICD-10-CM | POA: Insufficient documentation

## 2016-02-27 DIAGNOSIS — Z8619 Personal history of other infectious and parasitic diseases: Secondary | ICD-10-CM | POA: Insufficient documentation

## 2016-02-27 DIAGNOSIS — Z8742 Personal history of other diseases of the female genital tract: Secondary | ICD-10-CM | POA: Insufficient documentation

## 2016-02-27 DIAGNOSIS — Z3202 Encounter for pregnancy test, result negative: Secondary | ICD-10-CM | POA: Insufficient documentation

## 2016-02-27 DIAGNOSIS — R630 Anorexia: Secondary | ICD-10-CM | POA: Insufficient documentation

## 2016-02-27 DIAGNOSIS — K921 Melena: Secondary | ICD-10-CM | POA: Insufficient documentation

## 2016-02-27 DIAGNOSIS — Z8711 Personal history of peptic ulcer disease: Secondary | ICD-10-CM | POA: Insufficient documentation

## 2016-02-27 HISTORY — DX: Other primary ovarian failure: E28.39

## 2016-02-27 HISTORY — DX: Systemic involvement of connective tissue, unspecified: M35.9

## 2016-02-27 LAB — COMPREHENSIVE METABOLIC PANEL
ALBUMIN: 4.1 g/dL (ref 3.5–5.0)
ALK PHOS: 59 U/L (ref 38–126)
ALT: 41 U/L (ref 14–54)
AST: 23 U/L (ref 15–41)
Anion gap: 8 (ref 5–15)
BUN: 14 mg/dL (ref 6–20)
CALCIUM: 9.7 mg/dL (ref 8.9–10.3)
CHLORIDE: 102 mmol/L (ref 101–111)
CO2: 26 mmol/L (ref 22–32)
Creatinine, Ser: 0.7 mg/dL (ref 0.44–1.00)
GFR calc non Af Amer: >60 mL/min (ref >60)
GLUCOSE: 102 mg/dL — AB (ref 65–99)
POTASSIUM: 4.2 mmol/L (ref 3.5–5.1)
Sodium: 136 mmol/L (ref 135–145)
Total Bilirubin: 0.6 mg/dL (ref 0.3–1.2)
Total Protein: 7.5 g/dL (ref 6.5–8.1)

## 2016-02-27 LAB — CBC
HEMATOCRIT: 36 % (ref 36.0–46.0)
Hemoglobin: 12.3 g/dL (ref 12.0–15.0)
MCH: 36.1 pg — AB (ref 26.0–34.0)
MCHC: 34.2 g/dL (ref 30.0–36.0)
MCV: 105.6 fL — AB (ref 78.0–100.0)
Platelets: 178 10*3/uL (ref 150–400)
RBC: 3.41 MIL/uL — ABNORMAL LOW (ref 3.87–5.11)
RDW: 11.8 % (ref 11.5–15.5)
WBC: 6.8 10*3/uL (ref 4.0–10.5)

## 2016-02-27 LAB — TYPE AND SCREEN
ABO/RH(D): B POS
Antibody Screen: NEGATIVE

## 2016-02-27 LAB — PREGNANCY, URINE: PREG TEST UR: NEGATIVE

## 2016-02-27 LAB — ABO/RH: ABO/RH(D): B POS

## 2016-02-27 NOTE — ED Notes (Signed)
Pt c/o lack of appetite for x2 weeks, "black stools" x1 day, and generalized weakness. Pt denies rectal pain. Pt denies abd pain. Pt A+OX4, ambulatory w/ nrml gait, and speaking in complete sentences.

## 2016-02-27 NOTE — ED Provider Notes (Signed)
CSN: [id number]     Arrival date & time 02/27/16  2111 History   First MD Initiated Contact with Patient 02/27/16 2244     Chief Complaint  Patient presents with  . Anorexia  . Melena     (Consider location/radiation/quality/duration/timing/severity/associated sxs/prior Treatment) HPI 40 year old female with history of PUD and GERD who presents with concern for dark stools. The past 2 weeks she does report decreased appetite, but today had 3 episodes of dark black appearing stools. No diarrhea, nausea or vomiting, abdominal pain. No recent fevers or chills. Does state that she is felt like she could potentially have had viral illness over this period of time. Has had some nasal congestion but denies sore throat, cough, difficulty breathing, chest pain, syncope or near syncope.  Past Medical History  Diagnosis Date  . H. pylori infection      treated twice  . S/P colonoscopy 2006    [place] w/[place](PAC), 1 polyp-benign  . GERD (gastroesophageal reflux disease)   . 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 Ovarian failure    Past Surgical History  Procedure Laterality Date  . Appendectomy    . Cholecystectomy      cholelithiasis  . Ovarian cyst removal  2010    laproscopic  . Esophagogastroduodenoscopy  07/16/2011    mild gastritis  . Dilatation & currettage/hysteroscopy with resectocope N/A 08/15/2014    Procedure: DILATATION & CURETTAGE, HYSTEROSCOPY WITH myosure;  Surgeon: Marvene Staff, MD;  Location: [facility];  Service: Gynecology;  Laterality: N/A;   Family History  Problem Relation Age of Onset  . Crohn's disease Brother 28  . Alcohol abuse Father   . Heart failure Mother    Social History  Substance Use Topics  . Smoking status: Current Every Day Smoker -- 1.00 packs/day for 12 years    Types: E-cigarettes    Last Attempt to Quit: 07/12/2011  . Smokeless tobacco: None  . Alcohol Use: No   OB History    No data available     Review of  Systems 10/14 systems reviewed and are negative other than those stated in the HPI    Allergies  Review of patient's allergies indicates no known allergies.  Home Medications   Prior to Admission medications   Medication Sig Start Date End Date Taking? Authorizing Provider  ENSURE (ENSURE) Take 237 mLs by mouth 6 (six) times daily.   Yes Historical Provider, MD  hydroxychloroquine (PLAQUENIL) 200 MG tablet TK 1 T PO BID 02/12/16  Yes Historical Provider, MD  ibuprofen (ADVIL,MOTRIN) 200 MG tablet Take 400 mg by mouth every 4 (four) hours as needed for moderate pain. pain   Yes Historical Provider, MD  predniSONE (DELTASONE) 10 MG tablet Take 10 mg by mouth as directed. As Directed. Prednisone Dose Regimen: Take 3 tabs daily for 7 Days, Then Take 2 tabs daily for 7 Days, Then Take 1.5 tabs daily for 7 Days, Then Take 1 Tab Daily for 7 Days, Then 0.5 tablet daily.   Yes Historical Provider, MD  acetaminophen (TYLENOL) 325 MG tablet Take 325 mg by mouth every 6 (six) hours as needed for headache.     Historical Provider, MD  dexlansoprazole (DEXILANT) 60 MG capsule Take 1 capsule (60 mg total) by mouth daily. Patient not taking: Reported on 02/27/2016 10/22/14   Mahala Menghini, PA-C  docusate sodium (COLACE) 100 MG capsule Take 1 capsule (100 mg total) by mouth every 12 (twelve) hours. Patient not taking: Reported on 02/27/2016 10/20/14  Fredia Sorrow, MD  ibuprofen (ADVIL,MOTRIN) 800 MG tablet Take 1 tablet (800 mg total) by mouth every 8 (eight) hours as needed. Patient not taking: Reported on 02/27/2016 08/15/14   Servando Salina, MD   BP 122/77 mmHg  Pulse 94  Temp(Src) 98.1 F (36.7 C) (Oral)  Resp 16  Ht 5\' 6"  (1.676 m)  Wt 160 lb (72.576 kg)  BMI 25.84 kg/m2  SpO2 100% Physical Exam Physical Exam  Nursing note and vitals reviewed. Constitutional: Well developed, well nourished, non-toxic, and in no acute distress Head: Normocephalic and atraumatic.  Mouth/Throat: Oropharynx  is clear and moist.  Neck: Normal range of motion. Neck supple.  Cardiovascular: Normal rate and regular rhythm.   Pulmonary/Chest: Effort normal and breath sounds normal.  Abdominal: Soft. There is no tenderness. There is no rebound and no guarding.  Musculoskeletal: Normal range of motion.  Neurological: Alert, no facial droop, fluent speech, moves all extremities symmetrically Skin: Skin is warm and dry.  Psychiatric: Cooperative   ED Course  Procedures (including critical care time) Labs Review Labs Reviewed  COMPREHENSIVE METABOLIC PANEL - Abnormal; Notable for the following:    Glucose, Bld 102 (*)    All other components within normal limits  CBC - Abnormal; Notable for the following:    RBC 3.41 (*)    MCV 105.6 (*)    MCH 36.1 (*)    All other components within normal limits  PREGNANCY, URINE  URINALYSIS, ROUTINE W REFLEX MICROSCOPIC (NOT AT [facility])  POC OCCULT BLOOD, ED  TYPE AND SCREEN  ABO/RH    Imaging Review Dg Chest 2 View  02/28/2016  CLINICAL DATA:  Generalized weakness, fatigue, decreased appetite. Symptoms for 2 weeks. EXAM: CHEST  2 VIEW COMPARISON:  10/20/2014 FINDINGS: The cardiomediastinal contours are normal. The lungs are clear. Pulmonary vasculature is normal. No consolidation, pleural effusion, or pneumothorax. No acute osseous abnormalities are seen. IMPRESSION: No acute pulmonary process. Electronically Signed   By: Jeb Levering M.D.   On: 02/28/2016 00:33   I have personally reviewed and evaluated these images and lab results as part of my medical decision-making.   EKG Interpretation None      MDM   Final diagnoses:  Decreased appetite  Dark stools    40 year old female with history of PUD and GERD who presents with concern of dark stools and decreased appetite. On presentation she has stable vital signs, well-appearing, in no acute distress. Has a soft and benign abdomen. Has no significant stools on rectal exam, and it is  guaiac-negative. She has a normal hemoglobin and normal BUN/creatinine ratio. Do not suspect active upper GI bleed at this time. Basic blood work overall unremarkable. No signs of infection. Discussed outpatient follow-up with PCP for further work up of decreased appetite. She will touch base with her Gi physician, Dr. Oneida Alar, regarding need for repeat endoscopy. Strict return and follow-up instructions reviewed. She expressed understanding of all discharge instructions and felt comfortable with the plan of care.    Forde Dandy, MD 02/28/16 0111

## 2016-02-28 LAB — URINALYSIS, ROUTINE W REFLEX MICROSCOPIC
BILIRUBIN URINE: NEGATIVE
GLUCOSE, UA: NEGATIVE mg/dL
HGB URINE DIPSTICK: NEGATIVE
Ketones, ur: NEGATIVE mg/dL
Leukocytes, UA: NEGATIVE
Nitrite: NEGATIVE
Protein, ur: NEGATIVE mg/dL
SPECIFIC GRAVITY, URINE: 1.008 (ref 1.005–1.030)
pH: 7 (ref 5.0–8.0)

## 2016-02-28 NOTE — Discharge Instructions (Signed)
Your blood work today was unremarkable and knee do not show signs of active bleeding. Please call your gastroenterologists and set up appropriate follow-up to discuss if you require repeat endoscopy.   Return for worsening symptoms, including feeling lightheaded or passing, having difficulty breathing, chest pain, or concern for ongoing or worsening bleeding.   Continue to follow-up with your primary care doctor regarding work-up of your decreased appetite.

## 2016-03-16 ENCOUNTER — Encounter: Payer: Self-pay | Admitting: Gastroenterology

## 2016-04-09 ENCOUNTER — Ambulatory Visit: Payer: Self-pay | Admitting: Gastroenterology

## 2016-04-09 ENCOUNTER — Telehealth: Payer: Self-pay | Admitting: Gastroenterology

## 2016-04-09 ENCOUNTER — Encounter: Payer: Self-pay | Admitting: Gastroenterology

## 2016-04-09 NOTE — Telephone Encounter (Signed)
PATIENT WAS A NO SHOW AND LETTER SENT  °

## 2016-07-26 ENCOUNTER — Emergency Department (HOSPITAL_COMMUNITY)
Admission: EM | Admit: 2016-07-26 | Discharge: 2016-07-26 | Disposition: A | Discharge status: Home / Self Care | Payer: Self-pay | Attending: Dermatology | Admitting: Dermatology

## 2016-07-26 ENCOUNTER — Encounter (HOSPITAL_COMMUNITY): Payer: Self-pay | Admitting: *Deleted

## 2016-07-26 DIAGNOSIS — I10 Essential (primary) hypertension: Secondary | ICD-10-CM | POA: Insufficient documentation

## 2016-07-26 DIAGNOSIS — Z5321 Procedure and treatment not carried out due to patient leaving prior to being seen by health care provider: Secondary | ICD-10-CM | POA: Insufficient documentation

## 2016-07-26 NOTE — ED Triage Notes (Signed)
Pt stated she has an appt with pcp in the morning. Did not want to wait.

## 2016-07-26 NOTE — ED Triage Notes (Signed)
This history was provided by patient and [facility] medical record via Care Everywhere.  Patient was seen at PCP at [facility] last week for blood in stool and found to have hemorrhoids.  Patient was found to be tachycardic last week and dx with dehydration.  Patient states her BP was elevated at PCP.  SBP noted to be 133.  She has been monitoring BP at [facility] 3x a day since last week and states SBP has consistently been in 130's.  Patient states she has hx of Lupus, but [facility] chart indicates "unspecified connective tissue disorder."  Patient also c/o exertional SOB which is new and numbness in feet.  Patient denies LE edema, chest pain and N/V.

## 2016-09-09 ENCOUNTER — Emergency Department (HOSPITAL_COMMUNITY): Payer: Self-pay

## 2016-09-09 ENCOUNTER — Encounter (HOSPITAL_COMMUNITY): Payer: Self-pay | Admitting: *Deleted

## 2016-09-09 ENCOUNTER — Emergency Department (HOSPITAL_COMMUNITY)
Admission: EM | Admit: 2016-09-09 | Discharge: 2016-09-09 | Disposition: A | Discharge status: Home / Self Care | Payer: Self-pay | Attending: Emergency Medicine | Admitting: Emergency Medicine

## 2016-09-09 DIAGNOSIS — Z5321 Procedure and treatment not carried out due to patient leaving prior to being seen by health care provider: Secondary | ICD-10-CM | POA: Insufficient documentation

## 2016-09-09 DIAGNOSIS — R51 Headache: Secondary | ICD-10-CM | POA: Insufficient documentation

## 2016-09-09 DIAGNOSIS — Z87891 Personal history of nicotine dependence: Secondary | ICD-10-CM | POA: Insufficient documentation

## 2016-09-09 DIAGNOSIS — R079 Chest pain, unspecified: Secondary | ICD-10-CM | POA: Insufficient documentation

## 2016-09-09 DIAGNOSIS — L932 Other local lupus erythematosus: Secondary | ICD-10-CM | POA: Insufficient documentation

## 2016-09-09 HISTORY — DX: Systemic lupus erythematosus, unspecified: M32.9

## 2016-09-09 HISTORY — DX: Reserved for concepts with insufficient information to code with codable children: IMO0002

## 2016-09-09 LAB — BASIC METABOLIC PANEL
ANION GAP: 11 (ref 5–15)
BUN: 8 mg/dL (ref 6–20)
CALCIUM: 9.7 mg/dL (ref 8.9–10.3)
CO2: 24 mmol/L (ref 22–32)
Chloride: 97 mmol/L — ABNORMAL LOW (ref 101–111)
Creatinine, Ser: 1.06 mg/dL — ABNORMAL HIGH (ref 0.44–1.00)
GLUCOSE: 84 mg/dL (ref 65–99)
Potassium: 3.2 mmol/L — ABNORMAL LOW (ref 3.5–5.1)
Sodium: 132 mmol/L — ABNORMAL LOW (ref 135–145)

## 2016-09-09 LAB — CBC
HCT: 35.2 % — ABNORMAL LOW (ref 36.0–46.0)
HEMOGLOBIN: 12.3 g/dL (ref 12.0–15.0)
MCH: 32.9 pg (ref 26.0–34.0)
MCHC: 34.9 g/dL (ref 30.0–36.0)
MCV: 94.1 fL (ref 78.0–100.0)
Platelets: 219 10*3/uL (ref 150–400)
RBC: 3.74 MIL/uL — AB (ref 3.87–5.11)
RDW: 11.7 % (ref 11.5–15.5)
WBC: 7.9 10*3/uL (ref 4.0–10.5)

## 2016-09-09 LAB — I-STAT TROPONIN, ED: TROPONIN I, POC: 0 ng/mL (ref 0.00–0.08)

## 2016-09-09 NOTE — ED Triage Notes (Signed)
Pt c/o headache x 2 days, chest pain and shortness of breath x 3 hours. Hx of lupus. Reports "inflammatory levels have been elevated"

## 2016-09-09 NOTE — ED Notes (Signed)
Pt's name called to recheck vitals no answer 

## 2016-09-10 ENCOUNTER — Emergency Department (HOSPITAL_COMMUNITY): Payer: Self-pay

## 2016-09-10 ENCOUNTER — Emergency Department (HOSPITAL_COMMUNITY)
Admission: EM | Admit: 2016-09-10 | Discharge: 2016-09-10 | Disposition: A | Discharge status: Home / Self Care | Payer: Self-pay | Attending: Emergency Medicine | Admitting: Emergency Medicine

## 2016-09-10 ENCOUNTER — Encounter (HOSPITAL_COMMUNITY): Payer: Self-pay | Admitting: Emergency Medicine

## 2016-09-10 DIAGNOSIS — R112 Nausea with vomiting, unspecified: Secondary | ICD-10-CM | POA: Insufficient documentation

## 2016-09-10 DIAGNOSIS — Z87891 Personal history of nicotine dependence: Secondary | ICD-10-CM | POA: Insufficient documentation

## 2016-09-10 DIAGNOSIS — R0789 Other chest pain: Secondary | ICD-10-CM | POA: Insufficient documentation

## 2016-09-10 DIAGNOSIS — R000 Tachycardia, unspecified: Secondary | ICD-10-CM | POA: Insufficient documentation

## 2016-09-10 LAB — COMPREHENSIVE METABOLIC PANEL
ALBUMIN: 4.4 g/dL (ref 3.5–5.0)
ALK PHOS: 64 U/L (ref 38–126)
ALT: 39 U/L (ref 14–54)
ANION GAP: 9 (ref 5–15)
AST: 38 U/L (ref 15–41)
BUN: 8 mg/dL (ref 6–20)
CALCIUM: 9.8 mg/dL (ref 8.9–10.3)
CO2: 28 mmol/L (ref 22–32)
Chloride: 99 mmol/L — ABNORMAL LOW (ref 101–111)
Creatinine, Ser: 0.99 mg/dL (ref 0.44–1.00)
GFR calc Af Amer: >60 mL/min (ref >60)
GFR calc non Af Amer: >60 mL/min (ref >60)
GLUCOSE: 122 mg/dL — AB (ref 65–99)
Potassium: 4 mmol/L (ref 3.5–5.1)
SODIUM: 136 mmol/L (ref 135–145)
Total Bilirubin: 1.2 mg/dL (ref 0.3–1.2)
Total Protein: 8.6 g/dL — ABNORMAL HIGH (ref 6.5–8.1)

## 2016-09-10 LAB — I-STAT BETA HCG BLOOD, ED (MC, WL, AP ONLY): I-stat hCG, quantitative: <5 m[IU]/mL (ref <5)

## 2016-09-10 LAB — D-DIMER, QUANTITATIVE: D-Dimer, Quant: 0.89 ug/mL-FEU — ABNORMAL HIGH (ref 0.00–0.50)

## 2016-09-10 MED ORDER — SODIUM CHLORIDE 0.9 % IV BOLUS (SEPSIS)
1000.0000 mL | Freq: Once | INTRAVENOUS | Status: AC
  Administered 2016-09-10: 1000 mL via INTRAVENOUS

## 2016-09-10 MED ORDER — ONDANSETRON 8 MG PO TBDP: 8.0000 mg | ORAL_TABLET | Freq: Three times a day (TID) | ORAL | 0 refills | Status: DC | PRN

## 2016-09-10 MED ORDER — IOPAMIDOL (ISOVUE-370) INJECTION 76%
INTRAVENOUS | Status: AC
  Administered 2016-09-10: 80 mL
  Filled 2016-09-10: qty 100

## 2016-09-10 NOTE — ED Notes (Signed)
Pt verbalized discharged instructions and rx. Pt vss at discharge, ambulatory and driven home by family member at bedside

## 2016-09-10 NOTE — ED Provider Notes (Signed)
[facility] DEPT Provider Note   CSN: [id number] Arrival date & time: 09/10/16  [id number]     History   Chief Complaint Chief Complaint  Patient presents with  . Chest Pain  . Emesis    HPI Kim Fitzgerald is a 40 y.o. female.  HPI Kim Fitzgerald is a 40 y.o. 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 Patient states that she has had neck and upper shoulder pain for several days. She states that her mother was cleaning the house yesterday and there was a strong scent of pneumonia. She states after inhaling that she developed shortness of breath and chest pain. She states she also started to have nausea and vomiting. She came to emergency department, had some blood work drawn, chest x-ray, but left without being seen because of the long wait. She states today her nausea and vomiting has resolved. She is no longer having any chest pain or shortness of breath. She is still having some pain in her neck and shoulders. She reports several days ago she had severe headache in the back of her head, and states that she had a muscle spasm where her neck was tilted to the left. She reports her mother "worked it out" and "it released." Pt is followed by [facility] for her autoimune disease.   Past Medical History:  Diagnosis Date  . GERD (gastroesophageal reflux disease)   . H. pylori infection     treated twice  . Lupus ([place])   . Ovarian failure   . S/P colonoscopy 2006   [facility]-MD w/[place](PAC), 1 polyp-benign  . Undifferentiated connective tissue disease (Kim Fitzgerald) 2017    Patient Active Problem List   Diagnosis Date Noted  . GERD (gastroesophageal reflux disease) 06/30/2011  . History of colonic polyps 06/30/2011    Past Surgical History:  Procedure Laterality Date  . APPENDECTOMY    . CHOLECYSTECTOMY     cholelithiasis  . DILATATION & CURRETTAGE/HYSTEROSCOPY WITH RESECTOCOPE N/A 08/15/2014   Procedure: DILATATION & CURETTAGE, HYSTEROSCOPY WITH myosure;  Surgeon: Marvene Staff, MD;  Location: [facility];  Service: Gynecology;  Laterality: N/A;  . ESOPHAGOGASTRODUODENOSCOPY  07/16/2011   mild gastritis  . OVARIAN CYST REMOVAL  2010   laproscopic    OB History    No data available       Home Medications    Prior to Admission medications   Medication Sig Start Date End Date Taking? Authorizing Provider  dexlansoprazole (DEXILANT) 60 MG capsule Take 1 capsule (60 mg total) by mouth daily. 10/22/14  Yes Mahala Menghini, PA-C  ENSURE PLUS (ENSURE PLUS) LIQD Take 237 mLs by mouth 3 (three) times daily between meals.   Yes Historical Provider, MD  hydroxychloroquine (PLAQUENIL) 200 MG tablet Take 200 mg by mouth 2 (two) times daily.   Yes Historical Provider, MD  ibuprofen (ADVIL,MOTRIN) 800 MG tablet Take 1 tablet (800 mg total) by mouth every 8 (eight) hours as needed. 08/15/14  Yes Sheronette Cousins, MD  predniSONE (DELTASONE) 5 MG tablet Take 5 mg by mouth daily.    Yes Historical Provider, MD  pregabalin (LYRICA) 300 MG capsule Take 300 mg by mouth 2 (two) times daily.   Yes Historical Provider, MD  docusate sodium (COLACE) 100 MG capsule Take 1 capsule (100 mg total) by mouth every 12 (twelve) hours. Patient not taking: Reported on 09/10/2016 10/20/14  Fredia Sorrow, MD    Family History Family History  Problem Relation Age of Onset  . Heart failure Mother   . Alcohol abuse Father   . Crohn's disease Brother 63    Social History Social History  Substance Use Topics  . Smoking status: Former Smoker    Packs/day: 1.00    Years: 12.00    Types: E-cigarettes    Quit date: 07/11/2014  . Smokeless tobacco: Never Used  . Alcohol use No     Allergies   Review of patient's allergies indicates no known allergies.   Review of Systems Review of Systems  Constitutional:  Negative for chills and fever.  Respiratory: Positive for chest tightness and shortness of breath. Negative for cough.   Cardiovascular: Positive for chest pain. Negative for palpitations and leg swelling.  Gastrointestinal: Positive for nausea and vomiting. Negative for abdominal pain and diarrhea.  Genitourinary: Negative for dysuria, flank pain, pelvic pain, vaginal bleeding, vaginal discharge and vaginal pain.  Musculoskeletal: Positive for neck pain and neck stiffness. Negative for arthralgias and myalgias.  Skin: Negative for rash.  Neurological: Positive for headaches. Negative for dizziness and weakness.  All other systems reviewed and are negative.    Physical Exam Updated Vital Signs BP 120/88   Pulse 106   Temp 98.1 F (36.7 C) (Oral)   Resp 20   SpO2 100%   Physical Exam  Constitutional: She appears well-developed and well-nourished. No distress.  HENT:  Head: Normocephalic.  Eyes: Conjunctivae are normal.  Neck: Neck supple.  Cardiovascular: Normal rate, regular rhythm and normal heart sounds.   Pulmonary/Chest: Effort normal and breath sounds normal. No respiratory distress. She has no wheezes. She has no rales.  Abdominal: Soft. Bowel sounds are normal. She exhibits no distension. There is no tenderness. There is no rebound.  Musculoskeletal: She exhibits no edema.  TTP over posterior neck and bilateral trapezius muscles. Full ROM of the neck  Neurological: She is alert.  Skin: Skin is warm and dry.  Psychiatric: She has a normal mood and affect. Her behavior is normal.  Nursing note and vitals reviewed.    ED Treatments / Results  Labs (all labs ordered are listed, but only abnormal results are displayed) Labs Reviewed  COMPREHENSIVE METABOLIC PANEL - Abnormal; Notable for the following:       Result Value   Chloride 99 (*)    Glucose, Bld 122 (*)    Total Protein 8.6 (*)    All other components within normal limits  D-DIMER, QUANTITATIVE (NOT AT [facility])  - Abnormal; Notable for the following:    D-Dimer, Quant 0.89 (*)    All other components within normal limits  I-STAT BETA HCG BLOOD, ED ([facility], [facility], [facility] ONLY)    EKG  EKG Interpretation None       Radiology Dg Chest 2 View  Result Date: 09/09/2016 CLINICAL DATA:  Acute onset of left-sided chest pain and vomiting. Chest congestion. High blood pressure. Initial encounter. EXAM: CHEST  2 VIEW COMPARISON:  Chest radiograph from 02/27/2016 FINDINGS: The lungs are well-aerated and clear. There is no evidence of focal opacification, pleural effusion or pneumothorax. The heart is normal in size. No acute osseous abnormalities are seen. Clips are noted within the right upper quadrant, reflecting prior cholecystectomy. IMPRESSION: No acute cardiopulmonary process seen. Electronically Signed   By: Garald Balding M.D.   On: 09/09/2016 20:02   Ct Angio Chest Pe W And/or Wo Contrast  Result Date: 09/10/2016 CLINICAL DATA:  Chest pain for 1 day EXAM: CT ANGIOGRAPHY CHEST WITH CONTRAST TECHNIQUE: Multidetector CT imaging of the chest was performed using the standard protocol during bolus administration of intravenous contrast. Multiplanar CT image reconstructions and MIPs were obtained to evaluate the vascular anatomy. CONTRAST:  80 cc Isovue COMPARISON:  06/25/2011 FINDINGS: Cardiovascular: Cardiac size is unremarkable. No pericardial effusion. No aortic aneurysm. Mediastinum/Nodes: No mediastinal hematoma or adenopathy. No hilar adenopathy. Lungs/Pleura: Images of the lung parenchyma shows no acute infiltrate or pleural effusion. No pulmonary edema. No bronchiectasis. No pneumothorax. No focal consolidation. Upper Abdomen: Status postcholecystectomy. The visualized liver, pancreas, spleen and upper kidneys are unremarkable. Musculoskeletal: No destructive bony lesions are noted. Sagittal images of the spine are unremarkable. Review of the MIP images confirms the above findings. IMPRESSION: 1. No pulmonary  embolus. 2. No mediastinal hematoma or adenopathy. 3. No acute infiltrate or pulmonary edema. 4. Status postcholecystectomy. Electronically Signed   By: Lahoma Crocker M.D.   On: 09/10/2016 12:45    Procedures Procedures (including critical care time)  Medications Ordered in ED Medications  sodium chloride 0.9 % bolus 1,000 mL (not administered)  sodium chloride 0.9 % bolus 1,000 mL (not administered)     Initial Impression / Assessment and Plan / ED Course  I have reviewed the triage vital signs and the nursing notes.  Pertinent labs & imaging results that were available during my care of the patient were reviewed by me and considered in my medical decision making (see chart for details).  Clinical Course    Patient seen and examined. Patient with autoimmune disorder, followed by [facility]. Here with neck pain, headache, nausea vomiting or shortness of breath which resolved today. She was seen yesterday but left without being seen due to long wait. States she feels better today, but wanted to be rechecked. Have noticed the patient is tachycardic, otherwise normal vital signs. Record review showed that patient has been seen for her tachycardia by her doctor, no imaging tests were ordered at that time. She is referred to cardiology for that. Because of severe shortness of breath, chest pain, intermittent swelling in her legs, will get a d-dimer. Will recheck metabolic panel. Will give IV fluids.   1:29 PM CT angiogram was obtained because of elevated d-dimer and is negative. Patient feels better. Her heart rate is improving with IV fluids. She appears to be in no acute distress. Will discharge home, advised to drink plenty of fluids at home, will give prescription for Zofran, follow up with her specialists. At this time vital signs are stable, she is stable for discharge home. Lab work including CBC and chest x-ray was performed yesterday. Troponin was negative yesterday as well. Patient is not  pregnant. EKG unremarkable other than tachycardia.  Final Clinical Impressions(s) / ED Diagnoses   Final diagnoses:  Non-intractable vomiting with nausea, vomiting of unspecified type  Tachycardia    New Prescriptions New Prescriptions   ONDANSETRON (ZOFRAN ODT) 8 MG DISINTEGRATING TABLET    Take 1 tablet (8 mg total) by mouth every 8 (eight) hours as needed for nausea or vomiting.     Jeannett Senior, PA-C 09/10/16 [place], MD 09/11/16 [phone]

## 2016-09-10 NOTE — ED Triage Notes (Signed)
Pt here last night for same with CP and N/V

## 2016-09-10 NOTE — Discharge Instructions (Signed)
Take zofran as prescribed as needed for nausea and vomiting. Make sure to drink plenty of fluids. Follow up with primary  care doctor, your cardiologist and your rheumatologist if not improving.

## 2016-09-10 NOTE — ED Notes (Signed)
Patient Ambulated to room from waiting room  tolerated well.

## 2017-03-15 IMAGING — CT CT ANGIO CHEST
2 of 6 series · 18 of 36 positions shown · IV contrast (Omni 300)
Comparison: 06/25/2011

CLINICAL DATA: Chest pain for 1 day

EXAM:
CT ANGIOGRAPHY CHEST WITH CONTRAST
TECHNIQUE: Multidetector CT imaging of the chest was performed using the
standard protocol during bolus administration of intravenous
contrast. Multiplanar CT image reconstructions and MIPs were
obtained to evaluate the vascular anatomy.
CONTRAST:  80 cc Isovue

[Series 6: pe thins · axial · 0.62mm/px · z∈[+1068,+1302]mm · 17 of 264 slices shown]
[im 15/264  lung]
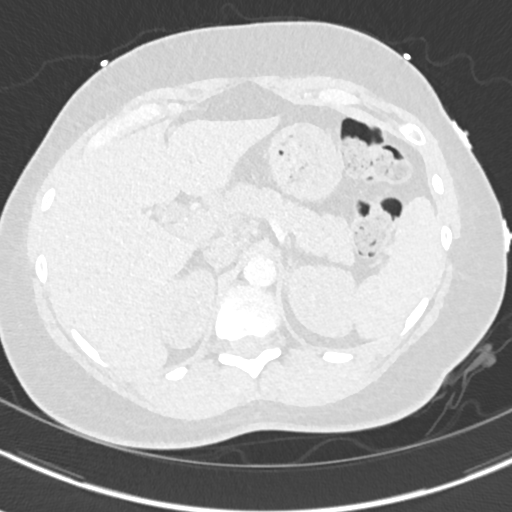
[im 30/264  mediastinal]
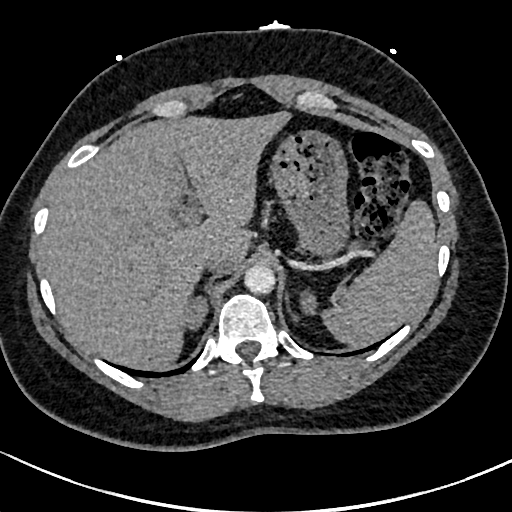
[im 44/264  lung]
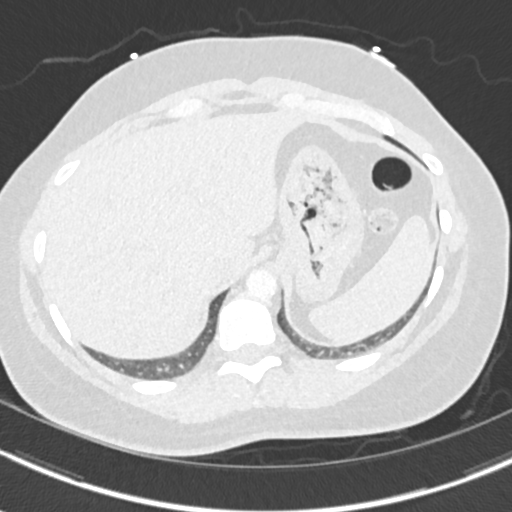
[im 59/264  mediastinal]
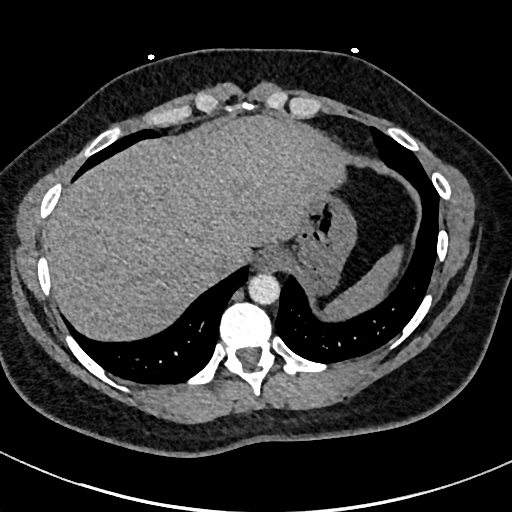
[im 74/264  lung]
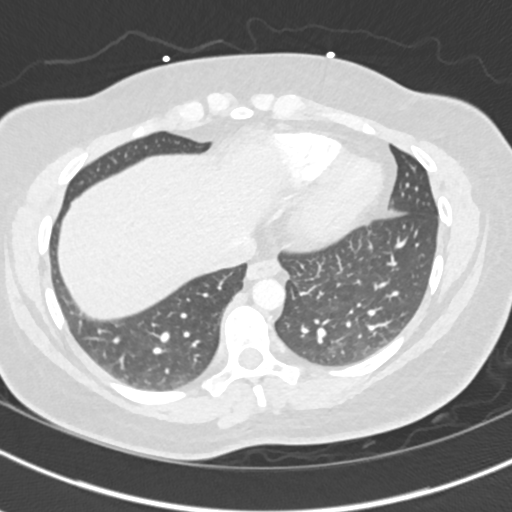
[im 88/264  mediastinal]
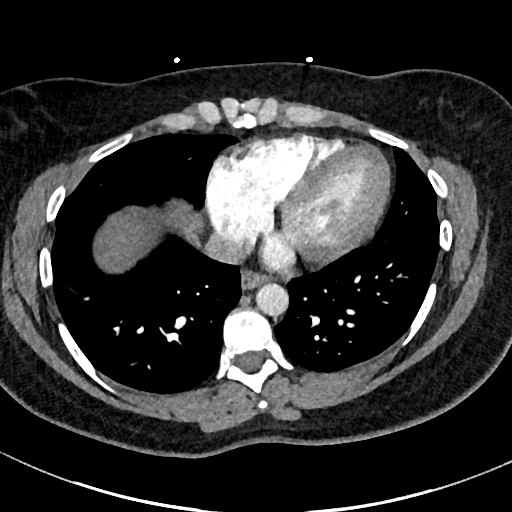
[im 103/264  lung]
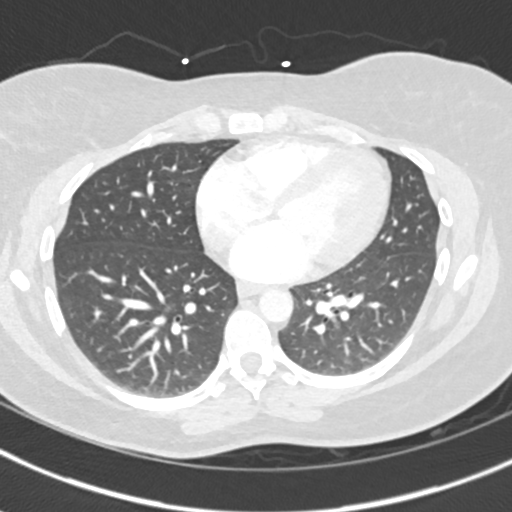
[im 117/264  mediastinal]
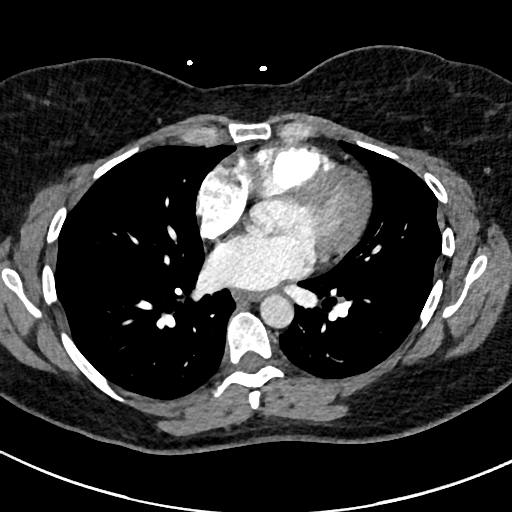
[im 132/264  lung]
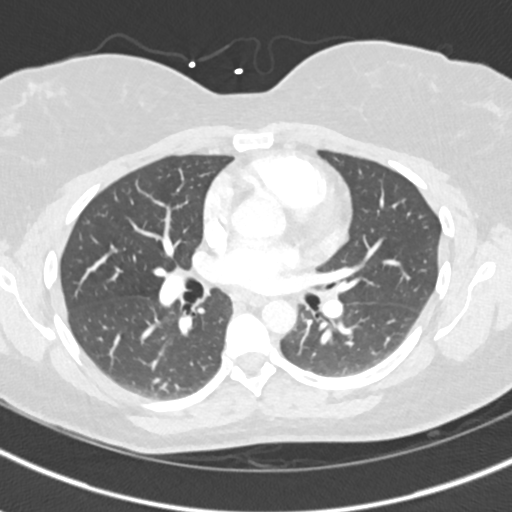
[im 147/264  mediastinal]
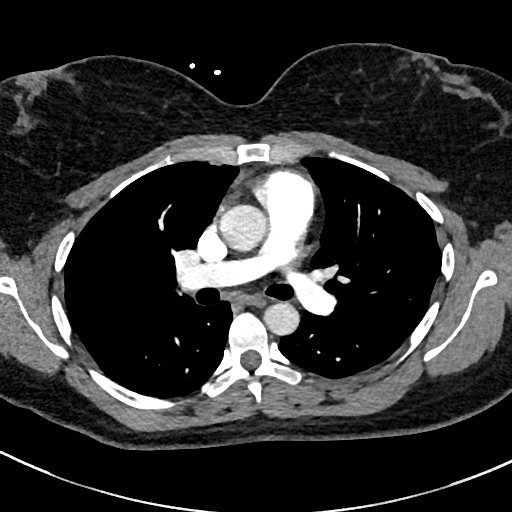
[im 161/264  lung]
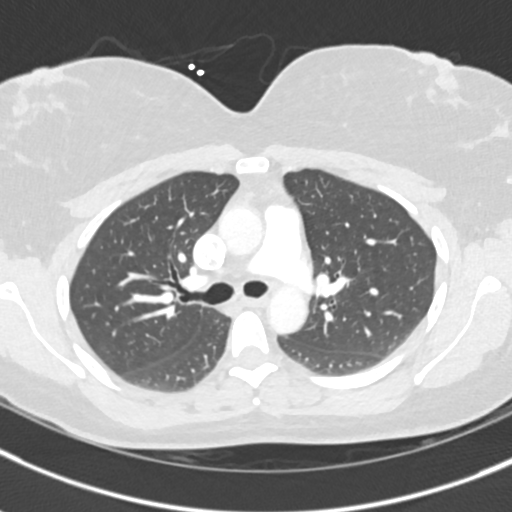
[im 176/264  mediastinal]
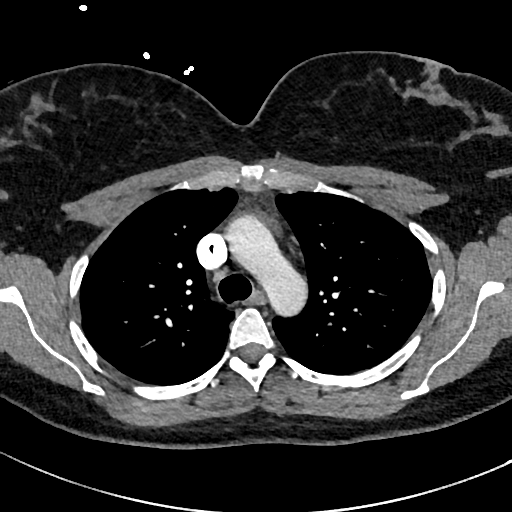
[im 190/264  lung]
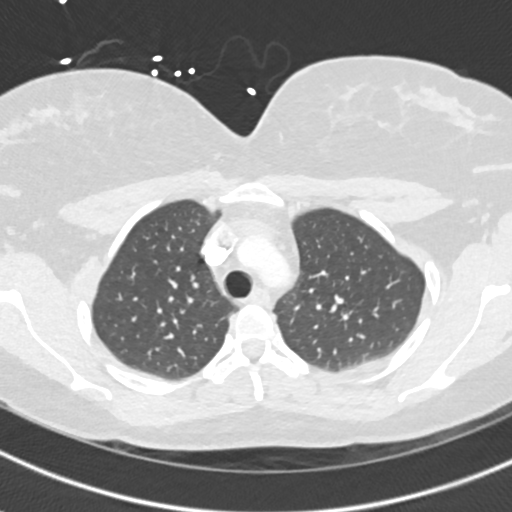
[im 205/264  mediastinal]
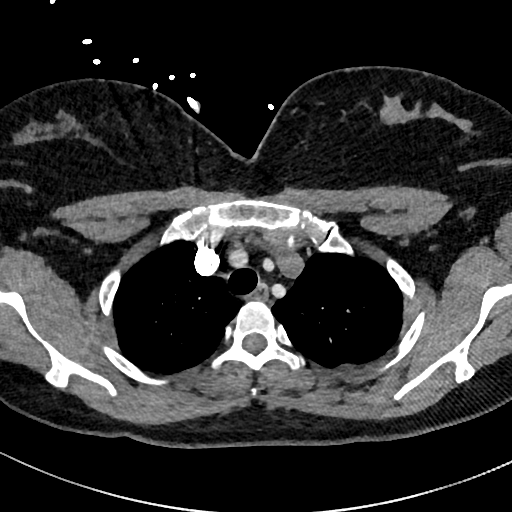
[im 220/264  lung]
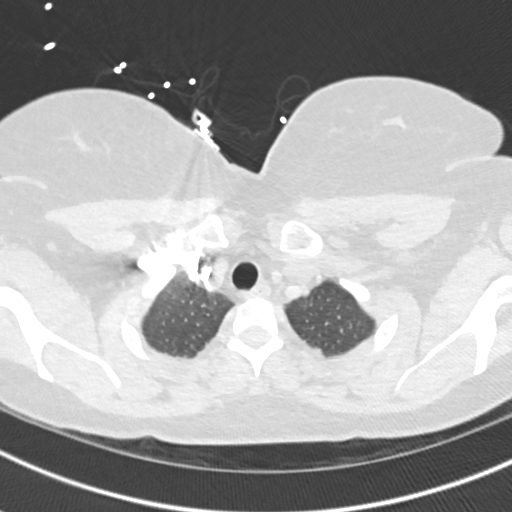
[im 234/264  mediastinal]
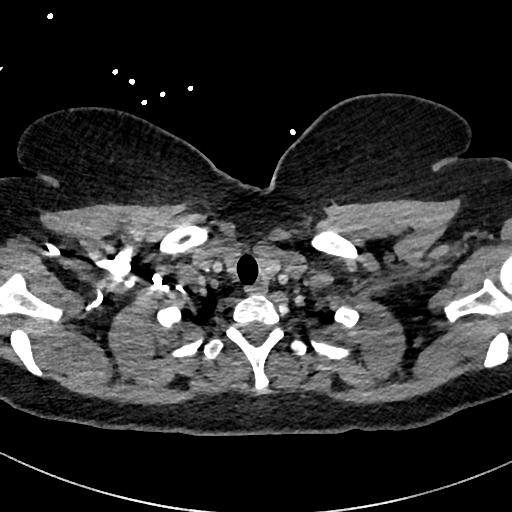
[im 249/264  lung]
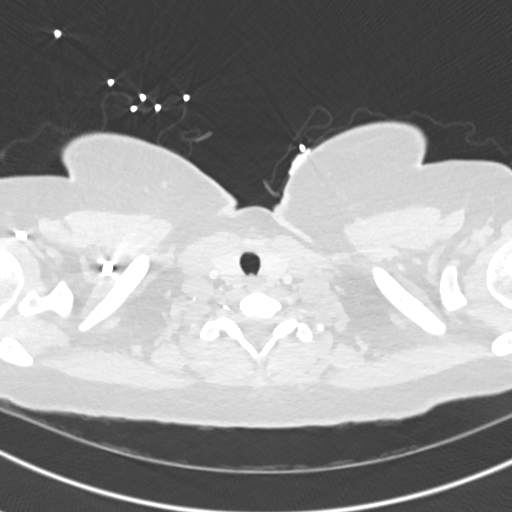

[Series 7: pe 2mm cor · coronal · 0.53mm/px · 1 of 151 slices shown]
[im 76/151  mediastinal]
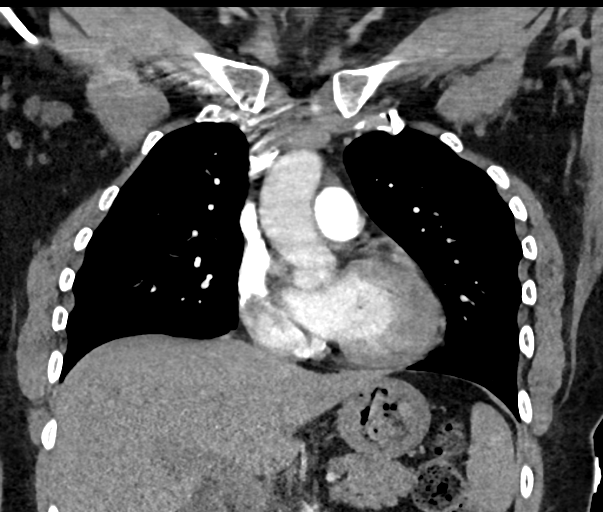

[18 of 36 positions shown; findings below may reference images not displayed]

FINDINGS: Cardiovascular: Cardiac size is unremarkable. No pericardial
effusion. No aortic aneurysm.

Mediastinum/Nodes: No mediastinal hematoma or adenopathy. No hilar
adenopathy.

Lungs/Pleura: Images of the lung parenchyma shows no acute
infiltrate or pleural effusion. No pulmonary edema. No
bronchiectasis. No pneumothorax. No focal consolidation.

Upper Abdomen: Status postcholecystectomy. The visualized liver,
pancreas, spleen and upper kidneys are unremarkable.

Musculoskeletal: No destructive bony lesions are noted. Sagittal
images of the spine are unremarkable.

Review of the MIP images confirms the above findings.
IMPRESSION: 1. No pulmonary embolus.
2. No mediastinal hematoma or adenopathy.
3. No acute infiltrate or pulmonary edema.
4. Status postcholecystectomy.

## 2017-08-15 ENCOUNTER — Inpatient Hospital Stay (HOSPITAL_COMMUNITY): Payer: Medicaid Other

## 2017-08-15 ENCOUNTER — Inpatient Hospital Stay (HOSPITAL_COMMUNITY)
Admission: EM | Admit: 2017-08-15 | Discharge: 2017-08-18 | DRG: 390 | Disposition: A | Discharge status: Home / Self Care | Payer: Medicaid Other | Attending: Family Medicine | Admitting: Family Medicine

## 2017-08-15 ENCOUNTER — Encounter (HOSPITAL_COMMUNITY): Payer: Self-pay | Admitting: Emergency Medicine

## 2017-08-15 ENCOUNTER — Emergency Department (HOSPITAL_COMMUNITY): Payer: Medicaid Other

## 2017-08-15 DIAGNOSIS — K219 Gastro-esophageal reflux disease without esophagitis: Secondary | ICD-10-CM

## 2017-08-15 DIAGNOSIS — K5669 Other partial intestinal obstruction: Principal | ICD-10-CM | POA: Diagnosis present

## 2017-08-15 DIAGNOSIS — M329 Systemic lupus erythematosus, unspecified: Secondary | ICD-10-CM | POA: Diagnosis present

## 2017-08-15 DIAGNOSIS — Z8249 Family history of ischemic heart disease and other diseases of the circulatory system: Secondary | ICD-10-CM

## 2017-08-15 DIAGNOSIS — Z90710 Acquired absence of both cervix and uterus: Secondary | ICD-10-CM | POA: Diagnosis not present

## 2017-08-15 DIAGNOSIS — E2839 Other primary ovarian failure: Secondary | ICD-10-CM | POA: Diagnosis present

## 2017-08-15 DIAGNOSIS — Z86010 Personal history of colonic polyps: Secondary | ICD-10-CM | POA: Diagnosis not present

## 2017-08-15 DIAGNOSIS — M359 Systemic involvement of connective tissue, unspecified: Secondary | ICD-10-CM | POA: Diagnosis present

## 2017-08-15 DIAGNOSIS — K5909 Other constipation: Secondary | ICD-10-CM | POA: Diagnosis present

## 2017-08-15 DIAGNOSIS — Z79899 Other long term (current) drug therapy: Secondary | ICD-10-CM | POA: Diagnosis not present

## 2017-08-15 DIAGNOSIS — M3500 Sicca syndrome, unspecified: Secondary | ICD-10-CM | POA: Diagnosis not present

## 2017-08-15 DIAGNOSIS — Z7952 Long term (current) use of systemic steroids: Secondary | ICD-10-CM

## 2017-08-15 DIAGNOSIS — Z9049 Acquired absence of other specified parts of digestive tract: Secondary | ICD-10-CM

## 2017-08-15 DIAGNOSIS — K56600 Partial intestinal obstruction, unspecified as to cause: Secondary | ICD-10-CM | POA: Diagnosis not present

## 2017-08-15 DIAGNOSIS — Z87891 Personal history of nicotine dependence: Secondary | ICD-10-CM

## 2017-08-15 LAB — COMPREHENSIVE METABOLIC PANEL
ALBUMIN: 5 g/dL (ref 3.5–5.0)
ALK PHOS: 65 U/L (ref 38–126)
ALT: 23 U/L (ref 14–54)
ANION GAP: 16 — AB (ref 5–15)
AST: 23 U/L (ref 15–41)
BILIRUBIN TOTAL: 1 mg/dL (ref 0.3–1.2)
BUN: 10 mg/dL (ref 6–20)
CALCIUM: 10.5 mg/dL — AB (ref 8.9–10.3)
CO2: 27 mmol/L (ref 22–32)
CREATININE: 1.32 mg/dL — AB (ref 0.44–1.00)
Chloride: 95 mmol/L — ABNORMAL LOW (ref 101–111)
GFR calc non Af Amer: 49 mL/min — ABNORMAL LOW (ref >60)
GFR, EST AFRICAN AMERICAN: 57 mL/min — AB (ref >60)
GLUCOSE: 117 mg/dL — AB (ref 65–99)
POTASSIUM: 3.5 mmol/L (ref 3.5–5.1)
SODIUM: 138 mmol/L (ref 135–145)
Total Protein: 8.8 g/dL — ABNORMAL HIGH (ref 6.5–8.1)

## 2017-08-15 LAB — I-STAT BETA HCG BLOOD, ED (MC, WL, AP ONLY)

## 2017-08-15 LAB — CBC WITH DIFFERENTIAL/PLATELET
Basophils Absolute: 0 10*3/uL (ref 0.0–0.1)
Basophils Relative: 0 %
EOS ABS: 0 10*3/uL (ref 0.0–0.7)
EOS PCT: 0 %
HCT: 40.5 % (ref 36.0–46.0)
Hemoglobin: 14 g/dL (ref 12.0–15.0)
LYMPHS ABS: 1 10*3/uL (ref 0.7–4.0)
Lymphocytes Relative: 10 %
MCH: 33.3 pg (ref 26.0–34.0)
MCHC: 34.6 g/dL (ref 30.0–36.0)
MCV: 96.4 fL (ref 78.0–100.0)
MONOS PCT: 4 %
Monocytes Absolute: 0.4 10*3/uL (ref 0.1–1.0)
Neutro Abs: 8.4 10*3/uL — ABNORMAL HIGH (ref 1.7–7.7)
Neutrophils Relative %: 86 %
PLATELETS: 191 10*3/uL (ref 150–400)
RBC: 4.2 MIL/uL (ref 3.87–5.11)
RDW: 11.7 % (ref 11.5–15.5)
WBC: 9.8 10*3/uL (ref 4.0–10.5)

## 2017-08-15 LAB — LIPASE, BLOOD: Lipase: 43 U/L (ref 11–51)

## 2017-08-15 MED ORDER — PROMETHAZINE HCL 25 MG/ML IJ SOLN
12.5000 mg | Freq: Once | INTRAMUSCULAR | Status: AC
  Administered 2017-08-15: 12.5 mg via INTRAVENOUS
  Filled 2017-08-15: qty 1

## 2017-08-15 MED ORDER — ONDANSETRON HCL 4 MG/2ML IJ SOLN
4.0000 mg | Freq: Once | INTRAMUSCULAR | Status: AC
  Administered 2017-08-15: 4 mg via INTRAVENOUS
  Filled 2017-08-15: qty 2

## 2017-08-15 MED ORDER — ENOXAPARIN SODIUM 40 MG/0.4ML ~~LOC~~ SOLN
40.0000 mg | SUBCUTANEOUS | Status: DC
  Administered 2017-08-15 – 2017-08-17 (×3): 40 mg via SUBCUTANEOUS
  Filled 2017-08-15 (×3): qty 0.4

## 2017-08-15 MED ORDER — SODIUM CHLORIDE 0.9 % IV BOLUS (SEPSIS)
1000.0000 mL | Freq: Once | INTRAVENOUS | Status: AC
  Administered 2017-08-15: 1000 mL via INTRAVENOUS

## 2017-08-15 MED ORDER — ONDANSETRON HCL 4 MG/2ML IJ SOLN
4.0000 mg | Freq: Four times a day (QID) | INTRAMUSCULAR | Status: DC | PRN
  Administered 2017-08-15: 4 mg via INTRAVENOUS
  Filled 2017-08-15: qty 2

## 2017-08-15 MED ORDER — METHYLPREDNISOLONE SODIUM SUCC 40 MG IJ SOLR
20.0000 mg | Freq: Every day | INTRAMUSCULAR | Status: DC
  Administered 2017-08-15 – 2017-08-17 (×3): 20 mg via INTRAVENOUS
  Filled 2017-08-15 (×3): qty 1

## 2017-08-15 MED ORDER — IOPAMIDOL (ISOVUE-300) INJECTION 61%
100.0000 mL | Freq: Once | INTRAVENOUS | Status: AC | PRN
  Administered 2017-08-15: 100 mL via INTRAVENOUS

## 2017-08-15 MED ORDER — DIPHENHYDRAMINE HCL 50 MG/ML IJ SOLN
25.0000 mg | Freq: Every evening | INTRAMUSCULAR | Status: DC | PRN
  Administered 2017-08-15: 25 mg via INTRAVENOUS
  Filled 2017-08-15 (×2): qty 1

## 2017-08-15 MED ORDER — ACETAMINOPHEN 650 MG RE SUPP: 650.0000 mg | Freq: Four times a day (QID) | RECTAL | Status: DC | PRN

## 2017-08-15 MED ORDER — SODIUM CHLORIDE 0.9 % IV SOLN
INTRAVENOUS | Status: DC
  Administered 2017-08-15 – 2017-08-17 (×4): via INTRAVENOUS

## 2017-08-15 MED ORDER — PANTOPRAZOLE SODIUM 40 MG IV SOLR
40.0000 mg | INTRAVENOUS | Status: DC
  Administered 2017-08-15: 40 mg via INTRAVENOUS
  Filled 2017-08-15 (×3): qty 40

## 2017-08-15 MED ORDER — ONDANSETRON HCL 4 MG PO TABS: 4.0000 mg | ORAL_TABLET | Freq: Four times a day (QID) | ORAL | Status: DC | PRN

## 2017-08-15 MED ORDER — BISACODYL 10 MG RE SUPP
10.0000 mg | Freq: Two times a day (BID) | RECTAL | Status: DC
  Administered 2017-08-15 – 2017-08-18 (×5): 10 mg via RECTAL
  Filled 2017-08-15 (×7): qty 1

## 2017-08-15 MED ORDER — LIDOCAINE HCL 2 % EX GEL
CUTANEOUS | Status: AC
  Filled 2017-08-15: qty 10

## 2017-08-15 MED ORDER — HYDROMORPHONE HCL 1 MG/ML IJ SOLN
1.0000 mg | Freq: Once | INTRAMUSCULAR | Status: AC
  Administered 2017-08-15: 1 mg via INTRAVENOUS
  Filled 2017-08-15: qty 1

## 2017-08-15 MED ORDER — ACETAMINOPHEN 325 MG PO TABS: 650.0000 mg | ORAL_TABLET | Freq: Four times a day (QID) | ORAL | Status: DC | PRN

## 2017-08-15 MED ORDER — IOPAMIDOL (ISOVUE-M 300) INJECTION 61%: 15.0000 mL | Freq: Once | INTRAMUSCULAR | Status: DC | PRN

## 2017-08-15 NOTE — Progress Notes (Signed)
Patient continues to refuse NG tube. She states she is feeling good, and does not need it. She stated if it needs to be reinserted she would have to be sedated and given pain medication for it because the NG tube hurts her so badly. She was explained that we don't sedate patients when NG tube is inserted and we also don't medicate for the discomfort of the tube. She states her stomach does not hurt and nothing hurts but the NG tube does. She was educated on risk factors such as reoccurance of nausea, vomiting, and the function of the NG tube. She continues to refuse. She was explained to remain NPO and not to have anything by mouth, not even ice chips. She verbalized understanding.

## 2017-08-15 NOTE — ED Notes (Signed)
NG tube inserted using Lidocaine jelly into right nare with no resistance met.  Pt tolerated well and immediate 313m return of yellow bile.

## 2017-08-15 NOTE — ED Triage Notes (Signed)
Pt reports waking up at 3am with abd cramping, severe nausea, and vomiting.  States feels like diarrhea but none yet.  Given Zofran 4mg  iVP by ems with minimal change per pt.

## 2017-08-15 NOTE — Progress Notes (Signed)
Kim Fitzgerald had been notified regarding patient pulled her NG tube out and is refusing to have it reinserted.

## 2017-08-15 NOTE — ED Notes (Signed)
Pt states she is feeling much better at this time.

## 2017-08-15 NOTE — ED Notes (Signed)
Pt states she is beginning to feel nauseated again.  Vomited x1 in floor.

## 2017-08-15 NOTE — Progress Notes (Signed)
Pharmacy Clarification:  Upon verification of lovenox order, received a BPA stating that patient may have received an epidural or intrathetical injection recently.  Noticed iopamidol intrathecal injection on patients profile, but this was not charted as given.  Discussed patient with RN Lilia Pro Dishmon) who confirmed that no intrathecal injections have been given and no contraindications to lovenox exist at present time.  Vertell Limber PharmD BCPS 08/15/2017 6:47 PM

## 2017-08-15 NOTE — ED Notes (Signed)
NG tube noted in fundus of stomach on xray.  Pt does not want it advanced any at this time as she is requesting something to help her relax.

## 2017-08-15 NOTE — H&P (Signed)
History and Physical    Kim Fitzgerald LKG:[id number] DOB: 23-Mar-1976 DOA: 08/15/2017  PCP: System, Pcp Not In  Patient coming from: Home  I have personally briefly reviewed patient's old medical records in [place]  Chief Complaint: Abdominal pain and vomiting  HPI: Kim Fitzgerald is a 41 y.o. female with medical history significant of undifferentiated connective tissue disease, GERD, previous surgeries including appendectomy and cholecystectomy, presented to the hospital with complaints of abdominal pain and nausea. She suddenly woke up this morning with abdominal distention, multiple episodes of vomiting and persistent nausea. She has associated dizziness. She is passing flatus as morning but has not had a bowel movement. No fever, cough, shortness of breath, dysuria.  ED Course: Imaging in the emergency room indicated partial small bowel obstruction. She had persistent nausea and vomiting and NG tube was placed for decompression. She's been admitted for further treatments  Review of Systems: As per HPI otherwise 10 point review of systems negative.    Past Medical History:  Diagnosis Date  . GERD (gastroesophageal reflux disease)   . H. pylori infection     treated twice  . Lupus   . Ovarian failure   . S/P colonoscopy 2006   [place]-[facility] w/[place](PAC), 1 polyp-benign  . Undifferentiated connective tissue disease ([place]) 2017    Past Surgical History:  Procedure Laterality Date  . APPENDECTOMY    . CHOLECYSTECTOMY     cholelithiasis  . DILATATION & CURRETTAGE/HYSTEROSCOPY WITH RESECTOCOPE N/A 08/15/2014   Procedure: DILATATION & CURETTAGE, HYSTEROSCOPY WITH myosure;  Surgeon: Marvene Staff, MD;  Location: [facility];  Service: Gynecology;  Laterality: N/A;  . ESOPHAGOGASTRODUODENOSCOPY  07/16/2011   mild gastritis  . OVARIAN CYST REMOVAL  2010   laproscopic     reports that she quit smoking about 3 years ago. Her smoking use included E-cigarettes. She has a  12.00 pack-year smoking history. She has never used smokeless tobacco. She reports that she does not drink alcohol or use drugs.  No Known Allergies  Family History  Problem Relation Age of Onset  . Heart failure Mother   . Alcohol abuse Father   . Crohn's disease Brother 12    Prior to Admission medications   Medication Sig Start Date End Date Taking? Authorizing Provider  gabapentin (NEURONTIN) 300 MG capsule Take 1 capsule by mouth daily. 06/24/17  Yes [provider]  hydroxychloroquine (PLAQUENIL) 200 MG tablet Take 200 mg by mouth 2 (two) times daily.   Yes [provider]  predniSONE (DELTASONE) 5 MG tablet Take 5 mg by mouth daily.    Yes [provider]  dexlansoprazole (DEXILANT) 60 MG capsule Take 1 capsule (60 mg total) by mouth daily. Patient not taking: Reported on 08/15/2017 10/22/14   Mahala Menghini, PA-C  docusate sodium (COLACE) 100 MG capsule Take 1 capsule (100 mg total) by mouth every 12 (twelve) hours. Patient not taking: Reported on 09/10/2016 10/20/14   Fredia Sorrow, MD  ibuprofen (ADVIL,MOTRIN) 800 MG tablet Take 1 tablet (800 mg total) by mouth every 8 (eight) hours as needed. Patient not taking: Reported on 08/15/2017 08/15/14   Servando Salina, MD  ondansetron (ZOFRAN ODT) 8 MG disintegrating tablet Take 1 tablet (8 mg total) by mouth every 8 (eight) hours as needed for nausea or vomiting. 09/10/16   Jeannett Senior, PA-C    Physical Exam: Vitals:   08/15/17 1030 08/15/17 1308 08/15/17 1433 08/15/17 1500  BP: 131/90 128/88 109/70 119/72  Pulse:  (!) 105  82 87  Resp:  16 18 18   Temp:  97.8 F (36.6 C) 97.7 F (36.5 C) 97.7 F (36.5 C)  TempSrc:  Oral Oral Oral  SpO2:  100% 94% 100%  Weight:    87 kg (191 lb 12.8 oz)  Height:    5\' 6"  (1.676 m)    Constitutional: NAD, calm, comfortable Vitals:   08/15/17 1030 08/15/17 1308 08/15/17 1433 08/15/17 1500  BP: 131/90 128/88 109/70 119/72  Pulse:  (!) 105 82 87  Resp:   16 18 18   Temp:  97.8 F (36.6 C) 97.7 F (36.5 C) 97.7 F (36.5 C)  TempSrc:  Oral Oral Oral  SpO2:  100% 94% 100%  Weight:    87 kg (191 lb 12.8 oz)  Height:    5\' 6"  (1.676 m)   Eyes: PERRL, lids and conjunctivae normal ENMT: Mucous membranes are moist. Posterior pharynx clear of any exudate or lesions.Normal dentition.  Neck: normal, supple, no masses, no thyromegaly Respiratory: clear to auscultation bilaterally, no wheezing, no crackles. Normal respiratory effort. No accessory muscle use.  Cardiovascular: Regular rate and rhythm, no murmurs / rubs / gallops. No extremity edema. 2+ pedal pulses. No carotid bruits.  Abdomen: diffuse tenderness tenderness, no masses palpated. No hepatosplenomegaly. Bowel sounds positive.  Musculoskeletal: no clubbing / cyanosis. No joint deformity upper and lower extremities. Good ROM, no contractures. Normal muscle tone.  Skin: no rashes, lesions, ulcers. No induration Neurologic: CN 2-12 grossly intact. Sensation intact, DTR normal. Strength 5/5 in all 4.  Psychiatric: Normal judgment and insight. Alert and oriented x 3. Normal mood.     Labs on Admission: I have personally reviewed following labs and imaging studies  CBC:  Recent Labs Lab 08/15/17 0942  WBC 9.8  NEUTROABS 8.4*  HGB 14.0  HCT 40.5  MCV 96.4  PLT 814   Basic Metabolic Panel:  Recent Labs Lab 08/15/17 0942  NA 138  K 3.5  CL 95*  CO2 27  GLUCOSE 117*  BUN 10  CREATININE 1.32*  CALCIUM 10.5*   GFR: Estimated Creatinine Clearance: 61.7 mL/min (A) (by C-G formula based on SCr of 1.32 mg/dL (H)). Liver Function Tests:  Recent Labs Lab 08/15/17 0942  AST 23  ALT 23  ALKPHOS 65  BILITOT 1.0  PROT 8.8*  ALBUMIN 5.0    Recent Labs Lab 08/15/17 0942  LIPASE 43   No results for input(s): AMMONIA in the last 168 hours. Coagulation Profile: No results for input(s): INR, PROTIME in the last 168 hours. Cardiac Enzymes: No results for input(s): CKTOTAL,  CKMB, CKMBINDEX, TROPONINI in the last 168 hours. BNP (last 3 results) No results for input(s): PROBNP in the last 8760 hours. HbA1C: No results for input(s): HGBA1C in the last 72 hours. CBG: No results for input(s): GLUCAP in the last 168 hours. Lipid Profile: No results for input(s): CHOL, HDL, LDLCALC, TRIG, CHOLHDL, LDLDIRECT in the last 72 hours. Thyroid Function Tests: No results for input(s): TSH, T4TOTAL, FREET4, T3FREE, THYROIDAB in the last 72 hours. Anemia Panel: No results for input(s): VITAMINB12, FOLATE, FERRITIN, TIBC, IRON, RETICCTPCT in the last 72 hours. Urine analysis:    Component Value Date/Time   COLORURINE YELLOW 02/27/2016 2330   APPEARANCEUR CLEAR 02/27/2016 2330   LABSPEC 1.008 02/27/2016 2330   PHURINE 7.0 02/27/2016 2330   GLUCOSEU NEGATIVE 02/27/2016 2330   HGBUR NEGATIVE 02/27/2016 2330   BILIRUBINUR NEGATIVE 02/27/2016 2330   KETONESUR NEGATIVE 02/27/2016 2330   PROTEINUR NEGATIVE 02/27/2016 2330  UROBILINOGEN 0.2 04/10/2013 1300   NITRITE NEGATIVE 02/27/2016 2330   LEUKOCYTESUR NEGATIVE 02/27/2016 2330    Radiological Exams on Admission: Ct Abdomen Pelvis W Contrast  Result Date: 08/15/2017 CLINICAL DATA:  Abdominal cramping with nausea and vomiting. EXAM: CT ABDOMEN AND PELVIS WITH CONTRAST TECHNIQUE: Multidetector CT imaging of the abdomen and pelvis was performed using the standard protocol following bolus administration of intravenous contrast. CONTRAST:  149mL ISOVUE-300 IOPAMIDOL (ISOVUE-300) INJECTION 61% COMPARISON:  10/20/2014 FINDINGS: Lower chest: Clear lung bases. Normal heart size without pericardial or pleural effusion. Hepatobiliary: Minimal motion degradation throughout the upper abdomen. No focal liver lesion. Cholecystectomy. Re- demonstration of mild intrahepatic and extrahepatic biliary duct dilatation. Example at 11 mm in the porta hepatis today versus similar (when remeasured). No obstructive stone or mass identified. Pancreas:  Normal, without mass or ductal dilatation. Spleen: Normal in size, without focal abnormality. Adrenals/Urinary Tract: Normal adrenal glands. Normal kidneys, without hydronephrosis. Normal urinary bladder. Stomach/Bowel: Normal stomach, without wall thickening. Normal caliber of the colon. Colonic stool burden suggests constipation. Normal appendix. Moderate fluid-filled proximal mid small bowel loops. No focal transition point is identified. The distal small bowel is decompressed. No evidence of complicating ischemia. Vascular/Lymphatic: Aortic and branch vessel atherosclerosis. No abdominopelvic adenopathy. Reproductive: Normal uterus and adnexa. Other: No significant free fluid.  No free intraperitoneal air. Musculoskeletal: Disc bulge at the lumbosacral junction. IMPRESSION: 1. Motion degradation throughout. 2. Partial small bowel obstruction. No cause identified. No complicating ischemia. 3.  Possible constipation. 4. Cholecystectomy and chronic mild common duct dilatation. Most likely within normal variation. Consider correlation with bilirubin level. Electronically Signed   By: Abigail Miyamoto M.D.   On: 08/15/2017 11:39   Dg Chest Portable 1 View  Result Date: 08/15/2017 CLINICAL DATA:  NG tube placement. EXAM: PORTABLE CHEST 1 VIEW COMPARISON:  Chest x-ray dated 09/09/2016 FINDINGS: Tip of the NG tube is in the fundus of the stomach. Heart size and pulmonary vascularity are normal. The visualized portions of the lungs are clear. No effusions. The lung apices are not included on the available radiograph. IMPRESSION: NG tube tip in the fundus of the stomach. No acute disease in the chest. Lung apices are not visible on this exam, performed for NG tube placement. Electronically Signed   By: Lorriane Shire M.D.   On: 08/15/2017 13:25    Assessment/Plan Active Problems:   GERD (gastroesophageal reflux disease)   Partial small bowel obstruction ([facility])   Undifferentiated connective tissue disease ([place])    Sjogren's disease ([place])    1. Partial small bowel obstruction. Continue NG tube and nothing by mouth. General surgery has been consulted. Encourage ambulation. Will start on Dulcolax suppositories. 2. Undifferentiated connective tissue disease. She is chronically on prednisone and hydroxychloroquine. We'll have to hold oral medications for now. Start on low-dose Solu-Medrol. 3. Sjogren's disease. See treatment as above. 4. GERD. Continue PPI.  DVT prophylaxis: lovenox Code Status: full code Family Communication: no family present Disposition Plan: discharge home once improved Consults called: general surgery Admission status:inpatient   Mancel Lardizabal MD [facility] Pager [phone]  If 7PM-7AM, please contact night-coverage [email or web address] Password [facility]  08/15/2017, 5:54 PM

## 2017-08-15 NOTE — ED Provider Notes (Signed)
[facility] DEPT Provider Note   CSN: [id number] Arrival date & time: 08/15/17  6144     History   Chief Complaint Chief Complaint  Patient presents with  . Emesis    HPI Kim Fitzgerald is a 41 y.o. female.  The patient was sudden onset around 45 this morning with abdominal distention and multiple episodes of vomiting. Associated with some dizziness. No diarrhea. No vomiting blood. Patient never had anything like this happen before. She has had her gallbladder and appendix removed. No sick contacts.      Past Medical History:  Diagnosis Date  . GERD (gastroesophageal reflux disease)   . H. pylori infection     treated twice  . Lupus   . Ovarian failure   . S/P colonoscopy 2006   [place]-MD w/[place](PAC), 1 polyp-benign  . Undifferentiated connective tissue disease ([place]) 2017    Patient Active Problem List   Diagnosis Date Noted  . Partial small bowel obstruction ([place]) 08/15/2017  . GERD (gastroesophageal reflux disease) 06/30/2011  . History of colonic polyps 06/30/2011    Past Surgical History:  Procedure Laterality Date  . APPENDECTOMY    . CHOLECYSTECTOMY     cholelithiasis  . DILATATION & CURRETTAGE/HYSTEROSCOPY WITH RESECTOCOPE N/A 08/15/2014   Procedure: DILATATION & CURETTAGE, HYSTEROSCOPY WITH myosure;  Surgeon: Marvene Staff, MD;  Location: [facility];  Service: Gynecology;  Laterality: N/A;  . ESOPHAGOGASTRODUODENOSCOPY  07/16/2011   mild gastritis  . OVARIAN CYST REMOVAL  2010   laproscopic    OB History    No data available       Home Medications    Prior to Admission medications   Medication Sig Start Date End Date Taking? Authorizing Provider  gabapentin (NEURONTIN) 300 MG capsule Take 1 capsule by mouth daily. 06/24/17  Yes [provider]  hydroxychloroquine (PLAQUENIL) 200 MG tablet Take 200 mg by mouth 2 (two) times daily.   Yes [provider]  predniSONE (DELTASONE) 5 MG tablet Take 5 mg by mouth daily.     Yes [provider]  dexlansoprazole (DEXILANT) 60 MG capsule Take 1 capsule (60 mg total) by mouth daily. Patient not taking: Reported on 08/15/2017 10/22/14   Mahala Menghini, PA-C  docusate sodium (COLACE) 100 MG capsule Take 1 capsule (100 mg total) by mouth every 12 (twelve) hours. Patient not taking: Reported on 09/10/2016 10/20/14   Fredia Sorrow, MD  ibuprofen (ADVIL,MOTRIN) 800 MG tablet Take 1 tablet (800 mg total) by mouth every 8 (eight) hours as needed. Patient not taking: Reported on 08/15/2017 08/15/14   Servando Salina, MD  ondansetron (ZOFRAN ODT) 8 MG disintegrating tablet Take 1 tablet (8 mg total) by mouth every 8 (eight) hours as needed for nausea or vomiting. 09/10/16   Jeannett Senior, PA-C    Family History Family History  Problem Relation Age of Onset  . Heart failure Mother   . Alcohol abuse Father   . Crohn's disease Brother 48    Social History Social History  Substance Use Topics  . Smoking status: Former Smoker    Packs/day: 1.00    Years: 12.00    Types: E-cigarettes    Quit date: 07/11/2014  . Smokeless tobacco: Never Used  . Alcohol use No     Allergies   Patient has no known allergies.   Review of Systems Review of Systems  Constitutional: Negative for fever.  HENT: Negative for congestion.   Eyes: Negative for visual disturbance.  Respiratory: Negative for shortness of  breath.   Cardiovascular: Negative for chest pain.  Gastrointestinal: Positive for abdominal pain, nausea and vomiting.  Genitourinary: Negative for dysuria.  Musculoskeletal: Negative for back pain.  Skin: Negative for rash.  Neurological: Negative for headaches.  Hematological: Does not bruise/bleed easily.  Psychiatric/Behavioral: Negative for confusion.     Physical Exam Updated Vital Signs BP 128/88   Pulse (!) 105   Temp 97.8 F (36.6 C) (Oral)   Resp 16   Ht 1.676 m (5\' 6" )   Wt 90.7 kg (200 lb)   SpO2 100%   BMI 32.28 kg/m    Physical Exam  Constitutional: She is oriented to person, place, and time. She appears well-developed and well-nourished.  HENT:  Head: Normocephalic and atraumatic.  Mouth/Throat: Oropharynx is clear and moist.  Eyes: Pupils are equal, round, and reactive to light. Conjunctivae and EOM are normal.  Neck: Normal range of motion. Neck supple.  Cardiovascular: Normal rate, regular rhythm and normal heart sounds.   Pulmonary/Chest: Effort normal and breath sounds normal. No respiratory distress.  Abdominal: She exhibits distension. There is no tenderness.  Musculoskeletal: Normal range of motion.  Neurological: She is alert and oriented to person, place, and time. No cranial nerve deficit. She exhibits normal muscle tone. Coordination normal.  Skin: Skin is warm.  Nursing note and vitals reviewed.    ED Treatments / Results  Labs (all labs ordered are listed, but only abnormal results are displayed) Labs Reviewed  COMPREHENSIVE METABOLIC PANEL - Abnormal; Notable for the following:       Result Value   Chloride 95 (*)    Glucose, Bld 117 (*)    Creatinine, Ser 1.32 (*)    Calcium 10.5 (*)    Total Protein 8.8 (*)    GFR calc non Af Amer 49 (*)    GFR calc Af Amer 57 (*)    Anion gap 16 (*)    All other components within normal limits  CBC WITH DIFFERENTIAL/PLATELET - Abnormal; Notable for the following:    Neutro Abs 8.4 (*)    All other components within normal limits  LIPASE, BLOOD  I-STAT BETA HCG BLOOD, ED ([facility], [facility], [facility] ONLY)    EKG  EKG Interpretation None       Radiology Ct Abdomen Pelvis W Contrast  Result Date: 08/15/2017 CLINICAL DATA:  Abdominal cramping with nausea and vomiting. EXAM: CT ABDOMEN AND PELVIS WITH CONTRAST TECHNIQUE: Multidetector CT imaging of the abdomen and pelvis was performed using the standard protocol following bolus administration of intravenous contrast. CONTRAST:  176mL ISOVUE-300 IOPAMIDOL (ISOVUE-300) INJECTION 61% COMPARISON:   10/20/2014 FINDINGS: Lower chest: Clear lung bases. Normal heart size without pericardial or pleural effusion. Hepatobiliary: Minimal motion degradation throughout the upper abdomen. No focal liver lesion. Cholecystectomy. Re- demonstration of mild intrahepatic and extrahepatic biliary duct dilatation. Example at 11 mm in the porta hepatis today versus similar (when remeasured). No obstructive stone or mass identified. Pancreas: Normal, without mass or ductal dilatation. Spleen: Normal in size, without focal abnormality. Adrenals/Urinary Tract: Normal adrenal glands. Normal kidneys, without hydronephrosis. Normal urinary bladder. Stomach/Bowel: Normal stomach, without wall thickening. Normal caliber of the colon. Colonic stool burden suggests constipation. Normal appendix. Moderate fluid-filled proximal mid small bowel loops. No focal transition point is identified. The distal small bowel is decompressed. No evidence of complicating ischemia. Vascular/Lymphatic: Aortic and branch vessel atherosclerosis. No abdominopelvic adenopathy. Reproductive: Normal uterus and adnexa. Other: No significant free fluid.  No free intraperitoneal air. Musculoskeletal: Disc bulge at the  lumbosacral junction. IMPRESSION: 1. Motion degradation throughout. 2. Partial small bowel obstruction. No cause identified. No complicating ischemia. 3.  Possible constipation. 4. Cholecystectomy and chronic mild common duct dilatation. Most likely within normal variation. Consider correlation with bilirubin level. Electronically Signed   By: Abigail Miyamoto M.D.   On: 08/15/2017 11:39    Procedures Procedures (including critical care time)  Medications Ordered in ED Medications  0.9 %  sodium chloride infusion (not administered)  iopamidol (ISOVUE-M) 61 % intrathecal injection 15 mL (not administered)  lidocaine (XYLOCAINE) 2 % jelly (not administered)  sodium chloride 0.9 % bolus 1,000 mL (1,000 mLs Intravenous New Bag/Given 08/15/17 0920)    promethazine (PHENERGAN) injection 12.5 mg (12.5 mg Intravenous Given 08/15/17 0920)  iopamidol (ISOVUE-300) 61 % injection 100 mL (100 mLs Intravenous Contrast Given 08/15/17 1056)  ondansetron (ZOFRAN) injection 4 mg (4 mg Intravenous Given 08/15/17 1252)     Initial Impression / Assessment and Plan / ED Course  I have reviewed the triage vital signs and the nursing notes.  Pertinent labs & imaging results that were available during my care of the patient were reviewed by me and considered in my medical decision making (see chart for details).     CT scan consistent with partial small bowel obstruction. Also a component of some constipation. Patient with an acute elevation in creatinine. BUN is normal. No leukocytosis. Abdomen nonsurgical. Patient with persistent vomiting so have placed an NG tube. Discussed with hospitalist they will admit for partial small bowel obstruction. Patient nontoxic currently. Consult also out to general surgery Dr. Arnoldo Morale.  Final Clinical Impressions(s) / ED Diagnoses   Final diagnoses:  Partial small bowel obstruction [facility])    New Prescriptions New Prescriptions   No medications on file     Fredia Sorrow, MD 08/15/17 1315

## 2017-08-16 LAB — CBC
HCT: 36.3 % (ref 36.0–46.0)
HEMOGLOBIN: 12.4 g/dL (ref 12.0–15.0)
MCH: 33.9 pg (ref 26.0–34.0)
MCHC: 34.2 g/dL (ref 30.0–36.0)
MCV: 99.2 fL (ref 78.0–100.0)
Platelets: 175 10*3/uL (ref 150–400)
RBC: 3.66 MIL/uL — AB (ref 3.87–5.11)
RDW: 11.2 % — ABNORMAL LOW (ref 11.5–15.5)
WBC: 4.6 10*3/uL (ref 4.0–10.5)

## 2017-08-16 LAB — BASIC METABOLIC PANEL
ANION GAP: 10 (ref 5–15)
BUN: 13 mg/dL (ref 6–20)
CALCIUM: 9.3 mg/dL (ref 8.9–10.3)
CO2: 27 mmol/L (ref 22–32)
Chloride: 101 mmol/L (ref 101–111)
Creatinine, Ser: 0.91 mg/dL (ref 0.44–1.00)
Glucose, Bld: 90 mg/dL (ref 65–99)
Potassium: 3.6 mmol/L (ref 3.5–5.1)
Sodium: 138 mmol/L (ref 135–145)

## 2017-08-16 MED ORDER — MILK AND MOLASSES ENEMA: 1.0000 | Freq: Once | RECTAL | Status: DC

## 2017-08-16 NOTE — Consult Note (Signed)
Reason for Consult: Small bowel obstruction, constipation Referring Physician: Dr. [name] is an 41 y.o. female.  HPI: Patient is a 41 year old black female status post multiple abdominal surgeries in the past who presented to the hospital with abdominal pain and nausea. She had several episodes of vomiting with nausea. She states this is her first episode. She has been passing flatus, but has had no bowel movement. No fever or chills have been noted. CT scan of the abdomen was performed which revealed a partial small bowel obstruction and constipation. She was admitted to the hospital for further evaluation and treatment. She did have an NG tube initially, but she pulled it out and refused replacement. She states that she has been passing flatus overnight. She has no significant abdominal pain at the present time. Her nausea has resolved. She denies any blood per rectum.  Past Medical History:  Diagnosis Date  . GERD (gastroesophageal reflux disease)   . H. pylori infection     treated twice  . Lupus   . Ovarian failure   . S/P colonoscopy 2006   [place]([facility]), 1 polyp-benign  . 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 APPENDECTOMY    . CHOLECYSTECTOMY     cholelithiasis  . DILATATION & CURRETTAGE/HYSTEROSCOPY WITH RESECTOCOPE N/A 08/15/2014   Procedure: DILATATION & CURETTAGE, HYSTEROSCOPY WITH myosure;  Surgeon: Marvene Staff, MD;  Location: [facility];  Service: Gynecology;  Laterality: N/A;  . ESOPHAGOGASTRODUODENOSCOPY  07/16/2011   mild gastritis  . OVARIAN CYST REMOVAL  2010   laproscopic    Family History  Problem Relation Age of Onset  . Heart failure Mother   . Alcohol abuse Father   . Crohn's disease Brother 62    Social History:  reports that she quit smoking about 3 years ago. Her smoking use included E-cigarettes. She has a 12.00 pack-year smoking history. She has  never used smokeless tobacco. She reports that she does not drink alcohol or use drugs.  Allergies: No Known Allergies  Medications:  Scheduled: . bisacodyl  10 mg Rectal BID  . enoxaparin (LOVENOX) injection  40 mg Subcutaneous Q24H  . methylPREDNISolone (SOLU-MEDROL) injection  20 mg Intravenous Daily  . milk and molasses  1 enema Rectal Once  . pantoprazole (PROTONIX) IV  40 mg Intravenous Q24H    Results for orders placed or performed during the hospital encounter of 08/15/17 (from the past 48 hour(s))  Comprehensive metabolic panel     Status: Abnormal   Collection Time: 08/15/17  9:42 AM  Result Value Ref Range   Sodium 138 135 - 145 mmol/L   Potassium 3.5 3.5 - 5.1 mmol/L   Chloride 95 (L) 101 - 111 mmol/L   CO2 27 22 - 32 mmol/L   Glucose, Bld 117 (H) 65 - 99 mg/dL   BUN 10 6 - 20 mg/dL   Creatinine, Ser 1.32 (H) 0.44 - 1.00 mg/dL   Calcium 10.5 (H) 8.9 - 10.3 mg/dL   Total Protein 8.8 (H) 6.5 - 8.1 g/dL   Albumin 5.0 3.5 - 5.0 g/dL   AST 23 15 - 41 U/L   ALT 23 14 - 54 U/L   Alkaline Phosphatase 65 38 - 126 U/L   Total Bilirubin 1.0 0.3 - 1.2 mg/dL   GFR calc non Af Amer 49 (L) >60 mL/min   GFR calc Af Amer 57 (L) >60 mL/min    Comment: (NOTE)  The eGFR has been calculated using the CKD EPI equation. This calculation has not been validated in all clinical situations. eGFR's persistently <60 mL/min signify possible Chronic Kidney Disease.    Anion gap 16 (H) 5 - 15  Lipase, blood     Status: None   Collection Time: 08/15/17  9:42 AM  Result Value Ref Range   Lipase 43 11 - 51 U/L  CBC with Differential/Platelet     Status: Abnormal   Collection Time: 08/15/17  9:42 AM  Result Value Ref Range   WBC 9.8 4.0 - 10.5 K/uL   RBC 4.20 3.87 - 5.11 MIL/uL   Hemoglobin 14.0 12.0 - 15.0 g/dL   HCT 40.5 36.0 - 46.0 %   MCV 96.4 78.0 - 100.0 fL   MCH 33.3 26.0 - 34.0 pg   MCHC 34.6 30.0 - 36.0 g/dL   RDW 11.7 11.5 - 15.5 %   Platelets 191 150 - 400 K/uL   Neutrophils  Relative % 86 %   Neutro Abs 8.4 (H) 1.7 - 7.7 K/uL   Lymphocytes Relative 10 %   Lymphs Abs 1.0 0.7 - 4.0 K/uL   Monocytes Relative 4 %   Monocytes Absolute 0.4 0.1 - 1.0 K/uL   Eosinophils Relative 0 %   Eosinophils Absolute 0.0 0.0 - 0.7 K/uL   Basophils Relative 0 %   Basophils Absolute 0.0 0.0 - 0.1 K/uL  I-Stat beta hCG blood, ED     Status: None   Collection Time: 08/15/17  9:52 AM  Result Value Ref Range   I-stat hCG, quantitative <5.0 <5 mIU/mL   Comment 3            Comment:   GEST. AGE      CONC.  (mIU/mL)   <=1 WEEK        5 - 50     2 WEEKS       50 - 500     3 WEEKS       100 - 10,000     4 WEEKS     1,000 - 30,000        FEMALE AND NON-PREGNANT FEMALE:     LESS THAN 5 mIU/mL   Basic metabolic panel     Status: None   Collection Time: 08/16/17  4:23 AM  Result Value Ref Range   Sodium 138 135 - 145 mmol/L   Potassium 3.6 3.5 - 5.1 mmol/L   Chloride 101 101 - 111 mmol/L   CO2 27 22 - 32 mmol/L   Glucose, Bld 90 65 - 99 mg/dL   BUN 13 6 - 20 mg/dL   Creatinine, Ser 0.91 0.44 - 1.00 mg/dL   Calcium 9.3 8.9 - 10.3 mg/dL   GFR calc non Af Amer >60 >60 mL/min   GFR calc Af Amer >60 >60 mL/min    Comment: (NOTE) The eGFR has been calculated using the CKD EPI equation. This calculation has not been validated in all clinical situations. eGFR's persistently <60 mL/min signify possible Chronic Kidney Disease.    Anion gap 10 5 - 15  CBC     Status: Abnormal   Collection Time: 08/16/17  4:23 AM  Result Value Ref Range   WBC 4.6 4.0 - 10.5 K/uL   RBC 3.66 (L) 3.87 - 5.11 MIL/uL   Hemoglobin 12.4 12.0 - 15.0 g/dL   HCT 36.3 36.0 - 46.0 %   MCV 99.2 78.0 - 100.0 fL   MCH 33.9 26.0 - 34.0  pg   MCHC 34.2 30.0 - 36.0 g/dL   RDW 11.2 (L) 11.5 - 15.5 %   Platelets 175 150 - 400 K/uL    Ct Abdomen Pelvis W Contrast  Result Date: 08/15/2017 CLINICAL DATA:  Abdominal cramping with nausea and vomiting. EXAM: CT ABDOMEN AND PELVIS WITH CONTRAST TECHNIQUE: Multidetector  CT imaging of the abdomen and pelvis was performed using the standard protocol following bolus administration of intravenous contrast. CONTRAST:  110m ISOVUE-300 IOPAMIDOL (ISOVUE-300) INJECTION 61% COMPARISON:  10/20/2014 FINDINGS: Lower chest: Clear lung bases. Normal heart size without pericardial or pleural effusion. Hepatobiliary: Minimal motion degradation throughout the upper abdomen. No focal liver lesion. Cholecystectomy. Re- demonstration of mild intrahepatic and extrahepatic biliary duct dilatation. Example at 11 mm in the porta hepatis today versus similar (when remeasured). No obstructive stone or mass identified. Pancreas: Normal, without mass or ductal dilatation. Spleen: Normal in size, without focal abnormality. Adrenals/Urinary Tract: Normal adrenal glands. Normal kidneys, without hydronephrosis. Normal urinary bladder. Stomach/Bowel: Normal stomach, without wall thickening. Normal caliber of the colon. Colonic stool burden suggests constipation. Normal appendix. Moderate fluid-filled proximal mid small bowel loops. No focal transition point is identified. The distal small bowel is decompressed. No evidence of complicating ischemia. Vascular/Lymphatic: Aortic and branch vessel atherosclerosis. No abdominopelvic adenopathy. Reproductive: Normal uterus and adnexa. Other: No significant free fluid.  No free intraperitoneal air. Musculoskeletal: Disc bulge at the lumbosacral junction. IMPRESSION: 1. Motion degradation throughout. 2. Partial small bowel obstruction. No cause identified. No complicating ischemia. 3.  Possible constipation. 4. Cholecystectomy and chronic mild common duct dilatation. Most likely within normal variation. Consider correlation with bilirubin level. Electronically Signed   By: KAbigail MiyamotoM.D.   On: 08/15/2017 11:39   Dg Chest Portable 1 View  Result Date: 08/15/2017 CLINICAL DATA:  NG tube placement. EXAM: PORTABLE CHEST 1 VIEW COMPARISON:  Chest x-ray dated 09/09/2016  FINDINGS: Tip of the NG tube is in the fundus of the stomach. Heart size and pulmonary vascularity are normal. The visualized portions of the lungs are clear. No effusions. The lung apices are not included on the available radiograph. IMPRESSION: NG tube tip in the fundus of the stomach. No acute disease in the chest. Lung apices are not visible on this exam, performed for NG tube placement. Electronically Signed   By: JLorriane ShireM.D.   On: 08/15/2017 13:25    ROS:  Pertinent items are noted in HPI.  Blood pressure 127/69, pulse 99, temperature 98.1 F (36.7 C), temperature source Oral, resp. rate 18, height _0  (1.676 m), weight 191 lb 12.8 oz (87 kg), SpO2 100 %. Physical Exam: Pleasant well-developed well-nourished white female in no acute distress. Head is normocephalic, atraumatic Lungs are clear to auscultation with equal breath sounds bilaterally. No rhonchi or rales are noted. Heart reveals a regular rate and rhythm without S3, S4, murmurs Abdomen is soft, nontender, nondistended. No rigidity is noted. Occasional bowel sounds are appreciated. No hepatosplenomegaly is noted. No hernias are noted. Rectal examination was deferred at this time.  CT scan images personally reviewed  Assessment/Plan: Impression: Partial small bowel obstruction with constipation. No need for acute surgical intervention at this time. Patient has agreed to a molasses enema. May have ice chips. Will advance diet once bowel function returns.  MAviva Signs8/28/2018, 8:24 AM

## 2017-08-16 NOTE — Progress Notes (Signed)
PROGRESS NOTE    Kim Fitzgerald  EYC:[id number] DOB: [birth year] DOA: 08/15/2017 PCP: System, Pcp Not In    Brief Narrative:  41 year old female with a history of undifferentiated connective tissue disease, Sjogren's disease, and previous history of appendectomy/cholecystectomy, admitted to the hospital with abdominal pain and nausea. Found to have partial small bowel obstruction. Being treated supportively. General surgery following. Anticipate discharge home in 1-2 days if she continues to improve.  Assessment & Plan:   Active Problems:   GERD (gastroesophageal reflux disease)   Partial small bowel obstruction (HCC)   Undifferentiated connective tissue disease ([place])   Sjogren's disease ([place])   1. Partial small bowel obstruction. Patient previously had NG tube, but this was removed overnight. Clinically, she is feeling better. General surgery is following. Encourage ambulation. She is on suppositories and enemas. Currently on ice chips. Advance diet as bowel function recovers.. 2. Undifferentiated connective tissue disease. She is chronically on prednisone and hydroxychloroquine. We'll have to hold oral medications for now. Started on low-dose Solu-Medrol. 3. Sjogren's disease. See treatment as above. 4. GERD. Continue PPI.   DVT prophylaxis: Lovenox Code Status: Full code Family Communication: No family present Disposition Plan: Discharge home once improved  Consultants:   Gen. surgery  Procedures:     Antimicrobials:      Subjective: Feeling better today. Nausea has resolved. Abdominal pain is improved. She is passing some flatus. Had a very small bowel movement earlier this morning.  Objective: Vitals:   08/15/17 1500 08/15/17 2144 08/15/17 2201 08/16/17 0527  BP: 119/72 125/73  127/69  Pulse: 87 100  99  Resp: 18 18  18   Temp: 97.7 F (36.5 C) 98.8 F (37.1 C)  98.1 F (36.7 C)  TempSrc: Oral Oral  Oral  SpO2: 100% 99% 97% 100%  Weight: 87 kg (191 lb  12.8 oz)     Height: 5\' 6"  (1.676 m)       Intake/Output Summary (Last 24 hours) at 08/16/17 1234 Last data filed at 08/16/17 0947  Gross per 24 hour  Intake             1650 ml  Output                0 ml  Net             1650 ml   Filed Weights   08/15/17 0829 08/15/17 1500  Weight: 90.7 kg (200 lb) 87 kg (191 lb 12.8 oz)    Examination:  General exam: Appears calm and comfortable  Respiratory system: Clear to auscultation. Respiratory effort normal. Cardiovascular system: S1 & S2 heard, RRR. No JVD, murmurs, rubs, gallops or clicks. No pedal edema. Gastrointestinal system: Abdomen is nondistended, soft and nontender. No organomegaly or masses felt. Normal bowel sounds heard. Central nervous system: Alert and oriented. No focal neurological deficits. Extremities: Symmetric 5 x 5 power. Skin: No rashes, lesions or ulcers Psychiatry: Judgement and insight appear normal. Mood & affect appropriate.     Data Reviewed: I have personally reviewed following labs and imaging studies  CBC:  Recent Labs Lab 08/15/17 0942 08/16/17 0423  WBC 9.8 4.6  NEUTROABS 8.4*  --   HGB 14.0 12.4  HCT 40.5 36.3  MCV 96.4 99.2  PLT 191 149   Basic Metabolic Panel:  Recent Labs Lab 08/15/17 0942 08/16/17 0423  NA 138 138  K 3.5 3.6  CL 95* 101  CO2 27 27  GLUCOSE 117* 90  BUN 10 13  CREATININE  1.32* 0.91  CALCIUM 10.5* 9.3   GFR: Estimated Creatinine Clearance: 89.5 mL/min (by C-G formula based on SCr of 0.91 mg/dL). Liver Function Tests:  Recent Labs Lab 08/15/17 0942  AST 23  ALT 23  ALKPHOS 65  BILITOT 1.0  PROT 8.8*  ALBUMIN 5.0    Recent Labs Lab 08/15/17 0942  LIPASE 43   No results for input(s): AMMONIA in the last 168 hours. Coagulation Profile: No results for input(s): INR, PROTIME in the last 168 hours. Cardiac Enzymes: No results for input(s): CKTOTAL, CKMB, CKMBINDEX, TROPONINI in the last 168 hours. BNP (last 3 results) No results for input(s):  PROBNP in the last 8760 hours. HbA1C: No results for input(s): HGBA1C in the last 72 hours. CBG: No results for input(s): GLUCAP in the last 168 hours. Lipid Profile: No results for input(s): CHOL, HDL, LDLCALC, TRIG, CHOLHDL, LDLDIRECT in the last 72 hours. Thyroid Function Tests: No results for input(s): TSH, T4TOTAL, FREET4, T3FREE, THYROIDAB in the last 72 hours. Anemia Panel: No results for input(s): VITAMINB12, FOLATE, FERRITIN, TIBC, IRON, RETICCTPCT in the last 72 hours. Sepsis Labs: No results for input(s): PROCALCITON, LATICACIDVEN in the last 168 hours.  No results found for this or any previous visit (from the past 240 hour(s)).       Radiology Studies: Ct Abdomen Pelvis W Contrast  Result Date: 08/15/2017 CLINICAL DATA:  Abdominal cramping with nausea and vomiting. EXAM: CT ABDOMEN AND PELVIS WITH CONTRAST TECHNIQUE: Multidetector CT imaging of the abdomen and pelvis was performed using the standard protocol following bolus administration of intravenous contrast. CONTRAST:  11mL ISOVUE-300 IOPAMIDOL (ISOVUE-300) INJECTION 61% COMPARISON:  10/20/2014 FINDINGS: Lower chest: Clear lung bases. Normal heart size without pericardial or pleural effusion. Hepatobiliary: Minimal motion degradation throughout the upper abdomen. No focal liver lesion. Cholecystectomy. Re- demonstration of mild intrahepatic and extrahepatic biliary duct dilatation. Example at 11 mm in the porta hepatis today versus similar (when remeasured). No obstructive stone or mass identified. Pancreas: Normal, without mass or ductal dilatation. Spleen: Normal in size, without focal abnormality. Adrenals/Urinary Tract: Normal adrenal glands. Normal kidneys, without hydronephrosis. Normal urinary bladder. Stomach/Bowel: Normal stomach, without wall thickening. Normal caliber of the colon. Colonic stool burden suggests constipation. Normal appendix. Moderate fluid-filled proximal mid small bowel loops. No focal transition  point is identified. The distal small bowel is decompressed. No evidence of complicating ischemia. Vascular/Lymphatic: Aortic and branch vessel atherosclerosis. No abdominopelvic adenopathy. Reproductive: Normal uterus and adnexa. Other: No significant free fluid.  No free intraperitoneal air. Musculoskeletal: Disc bulge at the lumbosacral junction. IMPRESSION: 1. Motion degradation throughout. 2. Partial small bowel obstruction. No cause identified. No complicating ischemia. 3.  Possible constipation. 4. Cholecystectomy and chronic mild common duct dilatation. Most likely within normal variation. Consider correlation with bilirubin level. Electronically Signed   By: Abigail Miyamoto M.D.   On: 08/15/2017 11:39   Dg Chest Portable 1 View  Result Date: 08/15/2017 CLINICAL DATA:  NG tube placement. EXAM: PORTABLE CHEST 1 VIEW COMPARISON:  Chest x-ray dated 09/09/2016 FINDINGS: Tip of the NG tube is in the fundus of the stomach. Heart size and pulmonary vascularity are normal. The visualized portions of the lungs are clear. No effusions. The lung apices are not included on the available radiograph. IMPRESSION: NG tube tip in the fundus of the stomach. No acute disease in the chest. Lung apices are not visible on this exam, performed for NG tube placement. Electronically Signed   By: Lorriane Shire M.D.   On: 08/15/2017  13:25        Scheduled Meds: . bisacodyl  10 mg Rectal BID  . enoxaparin (LOVENOX) injection  40 mg Subcutaneous Q24H  . methylPREDNISolone (SOLU-MEDROL) injection  20 mg Intravenous Daily  . milk and molasses  1 enema Rectal Once  . pantoprazole (PROTONIX) IV  40 mg Intravenous Q24H   Continuous Infusions: . sodium chloride 100 mL/hr at 08/15/17 1835     LOS: 1 day    Time spent: 35mins    Mikeala Girdler, MD [facility] Pager [phone]  If 7PM-7AM, please contact night-coverage [email or web address] Password [facility] 08/16/2017, 12:34 PM

## 2017-08-17 LAB — HIV ANTIBODY (ROUTINE TESTING W REFLEX): HIV SCREEN 4TH GENERATION: NONREACTIVE

## 2017-08-17 MED ORDER — HYDROXYCHLOROQUINE SULFATE 200 MG PO TABS
200.0000 mg | ORAL_TABLET | Freq: Two times a day (BID) | ORAL | Status: DC
  Administered 2017-08-18: 200 mg via ORAL
  Filled 2017-08-17 (×3): qty 1

## 2017-08-17 MED ORDER — PREDNISONE 10 MG PO TABS
5.0000 mg | ORAL_TABLET | Freq: Every day | ORAL | Status: DC
  Administered 2017-08-18: 5 mg via ORAL
  Filled 2017-08-17: qty 1

## 2017-08-17 NOTE — Care Management Note (Signed)
Case Management Note  Patient Details  Name: Kim Fitzgerald MRN: [id number] Date of Birth: 03-Feb-1976  Subjective/Objective:                  Pt admitted with SBO. Chart reviewed for CM needs. Pt from home, ind. Pt has insurance. No PCP listed but pt aware she must see MD listed on medicaid card. No care scanned into documents.   Action/Plan: DC home tomorrow with self care. No CM needs noted.   Expected Discharge Date:     08/18/2017             Expected Discharge Plan:  Home/Self Care  In-House Referral:  NA  Discharge planning Services  CM Consult  Post Acute Care Choice:  NA Choice offered to:  NA  Status of Service:  Completed, signed off  Sherald Barge, RN 08/17/2017, 2:12 PM

## 2017-08-17 NOTE — Progress Notes (Signed)
Subjective: Patient tolerating clear liquid diet. She has not had her enema yet. She is passing flatus.  She denies abdominal pain.  No nausea.  Objective: Vital signs in last 24 hours: Temp:  [98.5 F (36.9 C)-98.7 F (37.1 C)] 98.5 F (36.9 C) (08/28 2200) Pulse Rate:  [80-91] 91 (08/29 0700) Resp:  [16-18] 18 (08/29 0700) BP: (106-122)/(55-70) 106/59 (08/29 0700) SpO2:  [98 %-100 %] 98 % (08/29 0700) Last BM Date: 08/16/17  Intake/Output from previous day: 08/28 0701 - 08/29 0700 In: 958.3 [I.V.:958.3] Out: -  Intake/Output this shift: No intake/output data recorded.  General appearance: alert, cooperative and no distress GI: soft, non-tender; bowel sounds normal; no masses,  no organomegaly  Lab Results:   Recent Labs  08/15/17 0942 08/16/17 0423  WBC 9.8 4.6  HGB 14.0 12.4  HCT 40.5 36.3  PLT 191 175   BMET  Recent Labs  08/15/17 0942 08/16/17 0423  NA 138 138  K 3.5 3.6  CL 95* 101  CO2 27 27  GLUCOSE 117* 90  BUN 10 13  CREATININE 1.32* 0.91  CALCIUM 10.5* 9.3   PT/INR No results for input(s): LABPROT, INR in the last 72 hours.  Studies/Results: Ct Abdomen Pelvis W Contrast  Result Date: 08/15/2017 CLINICAL DATA:  Abdominal cramping with nausea and vomiting. EXAM: CT ABDOMEN AND PELVIS WITH CONTRAST TECHNIQUE: Multidetector CT imaging of the abdomen and pelvis was performed using the standard protocol following bolus administration of intravenous contrast. CONTRAST:  110mL ISOVUE-300 IOPAMIDOL (ISOVUE-300) INJECTION 61% COMPARISON:  10/20/2014 FINDINGS: Lower chest: Clear lung bases. Normal heart size without pericardial or pleural effusion. Hepatobiliary: Minimal motion degradation throughout the upper abdomen. No focal liver lesion. Cholecystectomy. Re- demonstration of mild intrahepatic and extrahepatic biliary duct dilatation. Example at 11 mm in the porta hepatis today versus similar (when remeasured). No obstructive stone or mass identified.  Pancreas: Normal, without mass or ductal dilatation. Spleen: Normal in size, without focal abnormality. Adrenals/Urinary Tract: Normal adrenal glands. Normal kidneys, without hydronephrosis. Normal urinary bladder. Stomach/Bowel: Normal stomach, without wall thickening. Normal caliber of the colon. Colonic stool burden suggests constipation. Normal appendix. Moderate fluid-filled proximal mid small bowel loops. No focal transition point is identified. The distal small bowel is decompressed. No evidence of complicating ischemia. Vascular/Lymphatic: Aortic and branch vessel atherosclerosis. No abdominopelvic adenopathy. Reproductive: Normal uterus and adnexa. Other: No significant free fluid.  No free intraperitoneal air. Musculoskeletal: Disc bulge at the lumbosacral junction. IMPRESSION: 1. Motion degradation throughout. 2. Partial small bowel obstruction. No cause identified. No complicating ischemia. 3.  Possible constipation. 4. Cholecystectomy and chronic mild common duct dilatation. Most likely within normal variation. Consider correlation with bilirubin level. Electronically Signed   By: Abigail Miyamoto M.D.   On: 08/15/2017 11:39   Dg Chest Portable 1 View  Result Date: 08/15/2017 CLINICAL DATA:  NG tube placement. EXAM: PORTABLE CHEST 1 VIEW COMPARISON:  Chest x-ray dated 09/09/2016 FINDINGS: Tip of the NG tube is in the fundus of the stomach. Heart size and pulmonary vascularity are normal. The visualized portions of the lungs are clear. No effusions. The lung apices are not included on the available radiograph. IMPRESSION: NG tube tip in the fundus of the stomach. No acute disease in the chest. Lung apices are not visible on this exam, performed for NG tube placement. Electronically Signed   By: Lorriane Shire M.D.   On: 08/15/2017 13:25    Anti-infectives: Anti-infectives    None      Assessment/Plan: Impression:  Partial small bowel obstruction with constipation, slowly resolving. No need for  acute surgical intervention. Have reordered molasses enema.  LOS: 2 days    Aviva Signs 08/17/2017

## 2017-08-17 NOTE — Progress Notes (Signed)
  PROGRESS NOTE  Kim Fitzgerald CYE:[id number] DOB: 1976-07-24 DOA: 08/15/2017 PCP: System, Pcp Not In  Brief Narrative: 41 year old woman PMH undifferentiated connective tissue disease admitted for partial small bowel obstruction.  Assessment/Plan Partial small bowel obstruction. -Clinically resolving. Tolerating liquids. Bowels moving. Continue management per general surgery.  Undifferentiated connective tissue disease, Sjogren's syndrome. -Resume daily prednisone and hydroxychloroquine.   Improving. Likely home next 24 hours if continues to improve.  DVT prophylaxis: enoxaparin Code Status: full Family Communication: none Disposition Plan: home    Murray Hodgkins, MD  [facility] Direct contact: [phone] --[place] [place]  --[email or web address]; password [email or web address]  7PM-7AM contact night coverage as above 08/17/2017, 11:51 AM  LOS: 2 days   Consultants: General surgery   Procedures:    Antimicrobials:    Interval history/Subjective: Feeling better. Several bowel movements this morning. Nausea resolved. No pain. Tolerating liquids.  Objective: Vitals: Afebrile, 98.5, 18, 91, 106/59, 98% on room air  Exam:     Constitutional: Appears calm, comfortable.  Cardiovascular. Regular rate and rhythm. No murmur, rub or gallop.  Respiratory. Clear to auscultation bilaterally. No wheezes, rales or rhonchi. Normal respiratory effort.  Abdomen soft, nontender, nondistended. Positive bowel sounds.   I have personally reviewed the following:   Labs: Basic metabolic panel unremarkable. CBC unremarkable.  Screening HIV non-reactive Urine pregnancy negative  Imaging studies: Chest x-ray no acute disease, independently reviewed   Scheduled Meds: . bisacodyl  10 mg Rectal BID  . enoxaparin (LOVENOX) injection  40 mg Subcutaneous Q24H  . [START ON 08/18/2017] hydroxychloroquine  200 mg Oral BID  . milk and molasses  1 enema Rectal Once  . pantoprazole (PROTONIX)  IV  40 mg Intravenous Q24H  . [START ON 08/18/2017] predniSONE  5 mg Oral Daily   Continuous Infusions:   Principal Problem:   Partial small bowel obstruction (HCC) Active Problems:   Undifferentiated connective tissue disease ([place])   Sjogren's disease ([place])   LOS: 2 days

## 2017-08-18 NOTE — Progress Notes (Signed)
  Subjective: Patient has no abdominal pain. Has had multiple bowel movements since her enema. She is eating a regular diet.  Objective: Vital signs in last 24 hours: Temp:  [98 F (36.7 C)-98.7 F (37.1 C)] 98.5 F (36.9 C) (08/30 0532) Pulse Rate:  [77-92] 83 (08/30 0532) Resp:  [16-18] 16 (08/30 0532) BP: (112-135)/(64-81) 112/69 (08/30 0532) SpO2:  [100 %] 100 % (08/30 0532) Last BM Date: 08/17/17  Intake/Output from previous day: No intake/output data recorded. Intake/Output this shift: No intake/output data recorded.  General appearance: alert, cooperative and no distress GI: soft, non-tender; bowel sounds normal; no masses,  no organomegaly  Lab Results:   Recent Labs  08/15/17 0942 08/16/17 0423  WBC 9.8 4.6  HGB 14.0 12.4  HCT 40.5 36.3  PLT 191 175   BMET  Recent Labs  08/15/17 0942 08/16/17 0423  NA 138 138  K 3.5 3.6  CL 95* 101  CO2 27 27  GLUCOSE 117* 90  BUN 10 13  CREATININE 1.32* 0.91  CALCIUM 10.5* 9.3   PT/INR No results for input(s): LABPROT, INR in the last 72 hours.  Studies/Results: No results found.  Anti-infectives: Anti-infectives    Start     Dose/Rate Route Frequency Ordered Stop   08/18/17 1000  hydroxychloroquine (PLAQUENIL) tablet 200 mg     200 mg Oral 2 times daily 08/17/17 1151        Assessment/Plan: Impression: Small bowel obstruction, resolved Plan: Okay for discharge from surgery standpoint. Will sign off.  LOS: 3 days    Aviva Signs 08/18/2017

## 2017-08-18 NOTE — Progress Notes (Signed)
Pt discharged home with family at bedside.  She wishes to ambulate to front door.  She is in stable condition.  All Questions and concerns addressed with patient.  No new orders for medications.  Pt ambulate to front door.

## 2017-08-18 NOTE — Discharge Summary (Signed)
Physician Discharge Summary  Kim Fitzgerald IEP:[id number] DOB: 27-Apr-1976 DOA: 08/15/2017  PCP: System, Pcp Not In  Admit date: 08/15/2017 Discharge date: 08/18/2017    Discharge Diagnoses:  1. 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  Hospital Course:  Patient was seen by general surgery and treated with bowel rest, NG tube, supportive care with gradual clinical improvement. On the day of discharge bowels moving, patient tolerating diet, cleared by general surgery for discharge. Hospitalization was uncomplicated.  Consultant: Gen. surgery.  Today's assessment: S: Feels well. No nausea or pain. Tolerating diet. Bowels moving. O: Vitals: Afebrile, 98.5, 16, 83, 112/69, 100% on room air   Constitutional. Appears calm, comfortable.  Respiratory. Clear to auscultation bilaterally. No wheezes, rales or rhonchi. Normal respiratory effort.  Cardiovascular. Regular rate and rhythm. No murmur, rub or gallop.  Abdomen. Positive bowel sounds. Soft, nontender, nondistended.  Psychiatric. Grossly normal mood and affect. Speech fluent and appropriate.  Discharge Instructions  Discharge Instructions    Activity as tolerated - No restrictions    Complete by:  As directed    Diet general    Complete by:  As directed    Discharge instructions    Complete by:  As directed    Call your physician or seek immediate medical attention for pain, vomiting, nausea or worsening of condition.     Allergies as of 08/18/2017   No Known Allergies     Medication List    TAKE these medications   dexlansoprazole 60 MG capsule Commonly known as:  DEXILANT Take 1 capsule (60  mg total) by mouth daily.   docusate sodium 100 MG capsule Commonly known as:  COLACE Take 1 capsule (100 mg total) by mouth every 12 (twelve) hours.   gabapentin 300 MG capsule Commonly known as:  NEURONTIN Take 1 capsule by mouth daily.   hydroxychloroquine 200 MG tablet Commonly known as:  PLAQUENIL Take 200 mg by mouth 2 (two) times daily.   ibuprofen 800 MG tablet Commonly known as:  ADVIL,MOTRIN Take 1 tablet (800 mg total) by mouth every 8 (eight) hours as needed. Notes to patient:  AS NEEDED   ondansetron 8 MG disintegrating tablet Commonly known as:  ZOFRAN ODT Take 1 tablet (8 mg total) by mouth every 8 (eight) hours as needed for nausea or vomiting.   predniSONE 5 MG tablet Commonly known as:  DELTASONE Take 5 mg by mouth daily.            Discharge Care Instructions        Start     Ordered   08/18/17 0000  Discharge instructions    Comments:  Call your physician or seek immediate medical attention for pain, vomiting, nausea or worsening of condition.   08/18/17 1010   08/18/17 0000  Activity as tolerated - No restrictions     08/18/17 1010   08/18/17 0000  Diet general     08/18/17 1010     No Known Allergies  The results of significant diagnostics from this hospitalization (including imaging, microbiology, ancillary and laboratory) are listed below for reference.    Significant Diagnostic Studies: Ct Abdomen Pelvis W Contrast  Result Date: 08/15/2017 CLINICAL DATA:  Abdominal cramping  with nausea and vomiting. EXAM: CT ABDOMEN AND PELVIS WITH CONTRAST TECHNIQUE: Multidetector CT imaging of the abdomen and pelvis was performed using the standard protocol following bolus administration of intravenous contrast. CONTRAST:  169mL ISOVUE-300 IOPAMIDOL (ISOVUE-300) INJECTION 61% COMPARISON:  10/20/2014 FINDINGS: Lower chest: Clear lung bases. Normal heart size without pericardial or pleural effusion. Hepatobiliary: Minimal motion degradation throughout the  upper abdomen. No focal liver lesion. Cholecystectomy. Re- demonstration of mild intrahepatic and extrahepatic biliary duct dilatation. Example at 11 mm in the porta hepatis today versus similar (when remeasured). No obstructive stone or mass identified. Pancreas: Normal, without mass or ductal dilatation. Spleen: Normal in size, without focal abnormality. Adrenals/Urinary Tract: Normal adrenal glands. Normal kidneys, without hydronephrosis. Normal urinary bladder. Stomach/Bowel: Normal stomach, without wall thickening. Normal caliber of the colon. Colonic stool burden suggests constipation. Normal appendix. Moderate fluid-filled proximal mid small bowel loops. No focal transition point is identified. The distal small bowel is decompressed. No evidence of complicating ischemia. Vascular/Lymphatic: Aortic and branch vessel atherosclerosis. No abdominopelvic adenopathy. Reproductive: Normal uterus and adnexa. Other: No significant free fluid.  No free intraperitoneal air. Musculoskeletal: Disc bulge at the lumbosacral junction. IMPRESSION: 1. Motion degradation throughout. 2. Partial small bowel obstruction. No cause identified. No complicating ischemia. 3.  Possible constipation. 4. Cholecystectomy and chronic mild common duct dilatation. Most likely within normal variation. Consider correlation with bilirubin level. Electronically Signed   By: Abigail Miyamoto M.D.   On: 08/15/2017 11:39   Dg Chest Portable 1 View  Result Date: 08/15/2017 CLINICAL DATA:  NG tube placement. EXAM: PORTABLE CHEST 1 VIEW COMPARISON:  Chest x-ray dated 09/09/2016 FINDINGS: Tip of the NG tube is in the fundus of the stomach. Heart size and pulmonary vascularity are normal. The visualized portions of the lungs are clear. No effusions. The lung apices are not included on the available radiograph. IMPRESSION: NG tube tip in the fundus of the stomach. No acute disease in the chest. Lung apices are not visible on this exam, performed for NG  tube placement. Electronically Signed   By: Lorriane Shire M.D.   On: 08/15/2017 13:25   Labs: Basic Metabolic Panel:  Recent Labs Lab 08/15/17 0942 08/16/17 0423  NA 138 138  K 3.5 3.6  CL 95* 101  CO2 27 27  GLUCOSE 117* 90  BUN 10 13  CREATININE 1.32* 0.91  CALCIUM 10.5* 9.3   Liver Function Tests:  Recent Labs Lab 08/15/17 0942  AST 23  ALT 23  ALKPHOS 65  BILITOT 1.0  PROT 8.8*  ALBUMIN 5.0    Recent Labs Lab 08/15/17 0942  LIPASE 43   CBC:  Recent Labs Lab 08/15/17 0942 08/16/17 0423  WBC 9.8 4.6  NEUTROABS 8.4*  --   HGB 14.0 12.4  HCT 40.5 36.3  MCV 96.4 99.2  PLT 191 175    Principal Problem:   Partial small bowel obstruction (HCC) Active Problems:   Undifferentiated connective tissue disease ([place])   Sjogren's disease ([place])   Time coordinating discharge: 20 minutes  Signed:  Murray Hodgkins, MD [facility] 08/18/2017, 2:29 PM

## 2017-11-15 ENCOUNTER — Encounter (HOSPITAL_COMMUNITY): Payer: Self-pay | Admitting: *Deleted

## 2017-11-15 ENCOUNTER — Emergency Department (HOSPITAL_COMMUNITY)
Admission: EM | Admit: 2017-11-15 | Discharge: 2017-11-15 | Disposition: A | Discharge status: Home / Self Care | Payer: Medicaid Other | Attending: Emergency Medicine | Admitting: Emergency Medicine

## 2017-11-15 ENCOUNTER — Other Ambulatory Visit: Payer: Self-pay

## 2017-11-15 DIAGNOSIS — Z87891 Personal history of nicotine dependence: Secondary | ICD-10-CM | POA: Diagnosis not present

## 2017-11-15 DIAGNOSIS — Z79899 Other long term (current) drug therapy: Secondary | ICD-10-CM | POA: Insufficient documentation

## 2017-11-15 DIAGNOSIS — G43A0 Cyclical vomiting, not intractable: Secondary | ICD-10-CM | POA: Insufficient documentation

## 2017-11-15 DIAGNOSIS — R1110 Vomiting, unspecified: Secondary | ICD-10-CM | POA: Diagnosis present

## 2017-11-15 DIAGNOSIS — R1084 Generalized abdominal pain: Secondary | ICD-10-CM | POA: Diagnosis not present

## 2017-11-15 DIAGNOSIS — R1115 Cyclical vomiting syndrome unrelated to migraine: Secondary | ICD-10-CM

## 2017-11-15 LAB — COMPREHENSIVE METABOLIC PANEL
ALK PHOS: 56 U/L (ref 38–126)
ALT: 16 U/L (ref 14–54)
ANION GAP: 9 (ref 5–15)
AST: 21 U/L (ref 15–41)
Albumin: 4.7 g/dL (ref 3.5–5.0)
BILIRUBIN TOTAL: 0.8 mg/dL (ref 0.3–1.2)
BUN: 11 mg/dL (ref 6–20)
CHLORIDE: 97 mmol/L — AB (ref 101–111)
CO2: 33 mmol/L — AB (ref 22–32)
Calcium: 10 mg/dL (ref 8.9–10.3)
Creatinine, Ser: 0.88 mg/dL (ref 0.44–1.00)
GFR calc Af Amer: >60 mL/min (ref >60)
GFR calc non Af Amer: >60 mL/min (ref >60)
GLUCOSE: 106 mg/dL — AB (ref 65–99)
POTASSIUM: 3.3 mmol/L — AB (ref 3.5–5.1)
SODIUM: 139 mmol/L (ref 135–145)
Total Protein: 8.6 g/dL — ABNORMAL HIGH (ref 6.5–8.1)

## 2017-11-15 LAB — URINALYSIS, ROUTINE W REFLEX MICROSCOPIC
BILIRUBIN URINE: NEGATIVE
GLUCOSE, UA: NEGATIVE mg/dL
HGB URINE DIPSTICK: NEGATIVE
Ketones, ur: NEGATIVE mg/dL
LEUKOCYTES UA: NEGATIVE
NITRITE: NEGATIVE
PROTEIN: 30 mg/dL — AB
SPECIFIC GRAVITY, URINE: 1.026 (ref 1.005–1.030)
pH: 6 (ref 5.0–8.0)

## 2017-11-15 LAB — CBC WITH DIFFERENTIAL/PLATELET
BASOS ABS: 0 10*3/uL (ref 0.0–0.1)
Basophils Relative: 0 %
EOS PCT: 0 %
Eosinophils Absolute: 0 10*3/uL (ref 0.0–0.7)
HEMATOCRIT: 39.7 % (ref 36.0–46.0)
Hemoglobin: 13 g/dL (ref 12.0–15.0)
LYMPHS ABS: 1.5 10*3/uL (ref 0.7–4.0)
LYMPHS PCT: 19 %
MCH: 32.5 pg (ref 26.0–34.0)
MCHC: 32.7 g/dL (ref 30.0–36.0)
MCV: 99.3 fL (ref 78.0–100.0)
MONO ABS: 0.3 10*3/uL (ref 0.1–1.0)
MONOS PCT: 4 %
NEUTROS ABS: 5.8 10*3/uL (ref 1.7–7.7)
Neutrophils Relative %: 77 %
PLATELETS: 186 10*3/uL (ref 150–400)
RBC: 4 MIL/uL (ref 3.87–5.11)
RDW: 12.3 % (ref 11.5–15.5)
WBC: 7.6 10*3/uL (ref 4.0–10.5)

## 2017-11-15 LAB — LIPASE, BLOOD: Lipase: 43 U/L (ref 11–51)

## 2017-11-15 LAB — I-STAT BETA HCG BLOOD, ED (MC, WL, AP ONLY)

## 2017-11-15 LAB — TROPONIN I: Troponin I: <0.03 ng/mL (ref <0.03)

## 2017-11-15 MED ORDER — ONDANSETRON 8 MG PO TBDP: 8.0000 mg | ORAL_TABLET | Freq: Three times a day (TID) | ORAL | 0 refills | Status: DC | PRN

## 2017-11-15 MED ORDER — SODIUM CHLORIDE 0.9 % IV BOLUS (SEPSIS)
1000.0000 mL | Freq: Once | INTRAVENOUS | Status: AC
  Administered 2017-11-15: 1000 mL via INTRAVENOUS

## 2017-11-15 MED ORDER — ONDANSETRON HCL 4 MG/2ML IJ SOLN
INTRAMUSCULAR | Status: AC
  Filled 2017-11-15: qty 2

## 2017-11-15 MED ORDER — ONDANSETRON HCL 4 MG/2ML IJ SOLN
4.0000 mg | Freq: Once | INTRAMUSCULAR | Status: AC
  Administered 2017-11-15: 4 mg via INTRAVENOUS

## 2017-11-15 MED ORDER — GI COCKTAIL ~~LOC~~
30.0000 mL | Freq: Once | ORAL | Status: AC
  Administered 2017-11-15: 30 mL via ORAL
  Filled 2017-11-15: qty 30

## 2017-11-15 MED ORDER — ONDANSETRON HCL 4 MG/2ML IJ SOLN
4.0000 mg | Freq: Once | INTRAMUSCULAR | Status: AC
  Administered 2017-11-15: 4 mg via INTRAVENOUS
  Filled 2017-11-15: qty 2

## 2017-11-15 MED ORDER — PANTOPRAZOLE SODIUM 40 MG IV SOLR
40.0000 mg | Freq: Once | INTRAVENOUS | Status: AC
  Administered 2017-11-15: 40 mg via INTRAVENOUS
  Filled 2017-11-15: qty 40

## 2017-11-15 NOTE — Discharge Instructions (Signed)
°  SEEK IMMEDIATE MEDICAL ATTENTION IF: °The pain does not go away or becomes severe, particularly over the next 8-12 hours.  °A temperature above 100.4F develops.  °Repeated vomiting occurs (multiple episodes).  ° °Blood is being passed in stools or vomit (bright red or black tarry stools).  °Return also if you develop chest pain, difficulty breathing, dizziness or fainting, or become confused, poorly responsive, or inconsolable. ° °

## 2017-11-15 NOTE — ED Provider Notes (Signed)
[facility] EMERGENCY DEPARTMENT Provider Note   CSN: [id number] Arrival date & time: 11/15/17  0348     History   Chief Complaint Chief Complaint  Patient presents with  . Emesis    HPI Kim Fitzgerald is a 41 y.o. female.  The history is provided by the patient.  Emesis   This is a new problem. The current episode started 6 to 12 hours ago. The problem has been gradually worsening. The emesis has an appearance of stomach contents. There has been no fever. Associated symptoms include abdominal pain and chills. Pertinent negatives include no diarrhea and no fever. Risk factors include suspect food intake.   Patient reports she had shrimp and cabbage for dinner last night, and soon after felt "trapped gas" and soon after began having nonbloody emesis.  She reports multiple episodes of emesis Nothing is improving her symptoms Taking p.o. worsens her symptoms She reports having a normal bowel movement earlier in the evening She now describes heartburn as well She reports some lower abdominal pain is improving She had otherwise been at her baseline prior to the episodes of vomiting  Past Medical History:  Diagnosis Date  . GERD (gastroesophageal reflux disease)   . H. pylori infection     treated twice  . Lupus   . Ovarian failure   . S/P colonoscopy 2006   [place] w/[place](PAC), 1 polyp-benign  . Undifferentiated connective tissue disease ([place]) 2017    Patient Active Problem List   Diagnosis Date Noted  . Partial small bowel obstruction ([place]) 08/15/2017  . Undifferentiated connective tissue disease ([place]) 08/15/2017  . Sjogren's disease ([place]) 08/15/2017  . GERD (gastroesophageal reflux disease) 06/30/2011  . History of colonic polyps 06/30/2011    Past Surgical History:  Procedure Laterality Date  . APPENDECTOMY    . CHOLECYSTECTOMY     cholelithiasis  . DILATATION & CURRETTAGE/HYSTEROSCOPY WITH RESECTOCOPE N/A 08/15/2014   Procedure: DILATATION &  CURETTAGE, HYSTEROSCOPY WITH myosure;  Surgeon: Marvene Staff, MD;  Location: [facility];  Service: Gynecology;  Laterality: N/A;  . ESOPHAGOGASTRODUODENOSCOPY  07/16/2011   mild gastritis  . OVARIAN CYST REMOVAL  2010   laproscopic    OB History    No data available       Home Medications    Prior to Admission medications   Medication Sig Start Date End Date Taking? Authorizing Provider  dexlansoprazole (DEXILANT) 60 MG capsule Take 1 capsule (60 mg total) by mouth daily. Patient not taking: Reported on 08/15/2017 10/22/14   Mahala Menghini, PA-C  docusate sodium (COLACE) 100 MG capsule Take 1 capsule (100 mg total) by mouth every 12 (twelve) hours. Patient not taking: Reported on 09/10/2016 10/20/14   Fredia Sorrow, MD  gabapentin (NEURONTIN) 300 MG capsule Take 1 capsule by mouth daily. 06/24/17   [provider]  hydroxychloroquine (PLAQUENIL) 200 MG tablet Take 200 mg by mouth 2 (two) times daily.    [provider]  ibuprofen (ADVIL,MOTRIN) 800 MG tablet Take 1 tablet (800 mg total) by mouth every 8 (eight) hours as needed. Patient not taking: Reported on 08/15/2017 08/15/14   Servando Salina, MD  ondansetron (ZOFRAN ODT) 8 MG disintegrating tablet Take 1 tablet (8 mg total) by mouth every 8 (eight) hours as needed for nausea or vomiting. 09/10/16   Kirichenko, Lahoma Rocker, PA-C  predniSONE (DELTASONE) 5 MG tablet Take 5 mg by mouth daily.     [provider]    Family History Family History  Problem Relation  Age of Onset  . Heart failure Mother   . Alcohol abuse Father   . Crohn's disease Brother 13    Social History Social History   Tobacco Use  . Smoking status: Former Smoker    Packs/day: 1.00    Years: 12.00    Pack years: 12.00    Types: E-cigarettes    Last attempt to quit: 07/11/2014    Years since quitting: 3.3  . Smokeless tobacco: Never Used  Substance Use Topics  . Alcohol use: No  . Drug use: No     Allergies   Patient  has no known allergies.   Review of Systems Review of Systems  Constitutional: Positive for chills and fatigue. Negative for fever.  Respiratory: Positive for shortness of breath.   Gastrointestinal: Positive for abdominal pain and vomiting. Negative for diarrhea.  Neurological: Positive for weakness.  All other systems reviewed and are negative.    Physical Exam Updated Vital Signs BP 137/88 (BP Location: Left Arm)   Pulse 94   Temp 98 F (36.7 C) (Oral)   Resp 18   SpO2 99%   Physical Exam CONSTITUTIONAL: Well developed/well nourished HEAD: Normocephalic/atraumatic EYES: EOMI/PERRL, no scleral icterus ENMT: Mucous membranes dry NECK: supple no meningeal signs SPINE/BACK:entire spine nontender CV: S1/S2 noted, no murmurs/rubs/gallops noted LUNGS: Lungs are clear to auscultation bilaterally, no apparent distress ABDOMEN: soft, nontender, no rebound or guarding, bowel sounds noted throughout abdomen, no distention abdomen is soft and flat, well-healed abdominal surgical scars noted GU:no cva tenderness NEURO: Pt is awake/alert/appropriate, moves all extremitiesx4.  No facial droop.   EXTREMITIES: pulses normal/equal, full ROM SKIN: warm, color normal PSYCH: no abnormalities of mood noted, alert and oriented to situation   ED Treatments / Results  Labs (all labs ordered are listed, but only abnormal results are displayed) Labs Reviewed  COMPREHENSIVE METABOLIC PANEL - Abnormal; Notable for the following components:      Result Value   Potassium 3.3 (*)    Chloride 97 (*)    CO2 33 (*)    Glucose, Bld 106 (*)    Total Protein 8.6 (*)    All other components within normal limits  URINALYSIS, ROUTINE W REFLEX MICROSCOPIC - Abnormal; Notable for the following components:   APPearance HAZY (*)    Protein, ur 30 (*)    Bacteria, UA RARE (*)    Squamous Epithelial / LPF 0-5 (*)    All other components within normal limits  CBC WITH DIFFERENTIAL/PLATELET  LIPASE, BLOOD   TROPONIN I  I-STAT BETA HCG BLOOD, ED ([facility], [facility], [facility] ONLY)    EKG  EKG Interpretation  Date/Time:  Tuesday November 15 2017 04:26:00 EST Ventricular Rate:  90 PR Interval:    QRS Duration: 88 QT Interval:  394 QTC Calculation: 483 R Axis:   27 Text Interpretation:  Sinus rhythm Probable left atrial enlargement Confirmed by Ripley Fraise [phone]) on 11/15/2017 4:28:53 AM       Radiology No results found.  Procedures Procedures   Medications Ordered in ED Medications  sodium chloride 0.9 % bolus 1,000 mL (0 mLs Intravenous Stopped 11/15/17 0605)  ondansetron (ZOFRAN) injection 4 mg (4 mg Intravenous Given 11/15/17 0432)  pantoprazole (PROTONIX) injection 40 mg (40 mg Intravenous Given 11/15/17 0434)  ondansetron (ZOFRAN) injection 4 mg (4 mg Intravenous Given 11/15/17 0502)     Initial Impression / Assessment and Plan / ED Course  I have reviewed the triage vital signs and the nursing notes.  Pertinent labs &  imaging results that were available during my care of the patient were reviewed by me and considered in my medical decision making (see chart for details).     4:13 AM Patient presents with multiple episodes of vomiting which she feels is due to shrimp for which she had for dinner She appears to be dehydrated but is not actively vomiting at this time Her abdominal exam is unremarkable currently Plan to rehydrate check labs and reassess 5:44 AM Overall patient is improved Labs are reassuring Patient is now taking p.o. Fluids Patient denies any abdominal pain at this time 6:16 AM She is resting comfortably and in no distress She is taking p.o. fluids without any vomiting At this point there are no signs of any acute abdominal emergency She elects to go home and monitor her symptoms Will prescribe Zofran for nausea vomiting at home Discussed strict ER return precautions Final Clinical Impressions(s) / ED Diagnoses   Final diagnoses:  Generalized abdominal  pain  Non-intractable cyclical vomiting with nausea    ED Discharge Orders        Ordered    ondansetron (ZOFRAN ODT) 8 MG disintegrating tablet  Every 8 hours PRN     11/15/17 0615       Ripley Fraise, MD 11/15/17 [phone]

## 2017-11-15 NOTE — ED Triage Notes (Signed)
  Pt c/o vomiting since 2100 last evening with abdominal pain; pt states shes not sure if it something she ate

## 2018-01-04 ENCOUNTER — Emergency Department (HOSPITAL_COMMUNITY)
Admission: EM | Admit: 2018-01-04 | Discharge: 2018-01-04 | Discharge status: Against Medical Advice | Payer: Medicaid Other

## 2018-02-05 ENCOUNTER — Other Ambulatory Visit: Payer: Self-pay

## 2018-02-05 ENCOUNTER — Encounter (HOSPITAL_COMMUNITY): Payer: Self-pay

## 2018-02-05 ENCOUNTER — Emergency Department (HOSPITAL_COMMUNITY)
Admission: EM | Admit: 2018-02-05 | Discharge: 2018-02-05 | Disposition: A | Discharge status: Home / Self Care | Payer: Medicaid Other | Attending: Emergency Medicine | Admitting: Emergency Medicine

## 2018-02-05 ENCOUNTER — Emergency Department (HOSPITAL_COMMUNITY): Payer: Medicaid Other

## 2018-02-05 DIAGNOSIS — R42 Dizziness and giddiness: Secondary | ICD-10-CM | POA: Diagnosis present

## 2018-02-05 DIAGNOSIS — H81399 Other peripheral vertigo, unspecified ear: Secondary | ICD-10-CM | POA: Insufficient documentation

## 2018-02-05 DIAGNOSIS — Z87891 Personal history of nicotine dependence: Secondary | ICD-10-CM | POA: Diagnosis not present

## 2018-02-05 DIAGNOSIS — Z79899 Other long term (current) drug therapy: Secondary | ICD-10-CM | POA: Diagnosis not present

## 2018-02-05 LAB — BASIC METABOLIC PANEL
Anion gap: 11 (ref 5–15)
BUN: 12 mg/dL (ref 6–20)
CHLORIDE: 102 mmol/L (ref 101–111)
CO2: 28 mmol/L (ref 22–32)
Calcium: 10.1 mg/dL (ref 8.9–10.3)
Creatinine, Ser: 0.78 mg/dL (ref 0.44–1.00)
GFR calc Af Amer: >60 mL/min (ref >60)
GFR calc non Af Amer: >60 mL/min (ref >60)
GLUCOSE: 102 mg/dL — AB (ref 65–99)
POTASSIUM: 3.9 mmol/L (ref 3.5–5.1)
Sodium: 141 mmol/L (ref 135–145)

## 2018-02-05 LAB — CBC WITH DIFFERENTIAL/PLATELET
Basophils Absolute: 0 10*3/uL (ref 0.0–0.1)
Basophils Relative: 0 %
EOS PCT: 1 %
Eosinophils Absolute: 0 10*3/uL (ref 0.0–0.7)
HEMATOCRIT: 33 % — AB (ref 36.0–46.0)
Hemoglobin: 11.2 g/dL — ABNORMAL LOW (ref 12.0–15.0)
LYMPHS ABS: 0.8 10*3/uL (ref 0.7–4.0)
LYMPHS PCT: 29 %
MCH: 32.7 pg (ref 26.0–34.0)
MCHC: 33.9 g/dL (ref 30.0–36.0)
MCV: 96.5 fL (ref 78.0–100.0)
Monocytes Absolute: 0.2 10*3/uL (ref 0.1–1.0)
Monocytes Relative: 7 %
NEUTROS ABS: 1.7 10*3/uL (ref 1.7–7.7)
NEUTROS PCT: 63 %
Platelets: 159 10*3/uL (ref 150–400)
RBC: 3.42 MIL/uL — AB (ref 3.87–5.11)
RDW: 11.8 % (ref 11.5–15.5)
WBC: 2.8 10*3/uL — AB (ref 4.0–10.5)

## 2018-02-05 LAB — URINALYSIS, ROUTINE W REFLEX MICROSCOPIC
Bacteria, UA: NONE SEEN
Bilirubin Urine: NEGATIVE
Glucose, UA: NEGATIVE mg/dL
KETONES UR: NEGATIVE mg/dL
Nitrite: NEGATIVE
PROTEIN: NEGATIVE mg/dL
Specific Gravity, Urine: 1.003 — ABNORMAL LOW (ref 1.005–1.030)
pH: 6 (ref 5.0–8.0)

## 2018-02-05 LAB — I-STAT BETA HCG BLOOD, ED (MC, WL, AP ONLY)

## 2018-02-05 MED ORDER — SODIUM CHLORIDE 0.9 % IV BOLUS (SEPSIS)
1000.0000 mL | Freq: Once | INTRAVENOUS | Status: AC
  Administered 2018-02-05: 1000 mL via INTRAVENOUS

## 2018-02-05 MED ORDER — MECLIZINE HCL 25 MG PO TABS: 25.0000 mg | ORAL_TABLET | Freq: Three times a day (TID) | ORAL | 0 refills | Status: DC | PRN

## 2018-02-05 NOTE — ED Triage Notes (Signed)
Pt arrives from home c/o of dizziness, vertigo, numbness in her fingers on and off for the past 2 weeks.

## 2018-02-05 NOTE — ED Provider Notes (Signed)
[facility] EMERGENCY DEPARTMENT Provider Note   CSN: [id number] Arrival date & time: 02/05/18  7824     History   Chief Complaint Chief Complaint  Patient presents with  . Dizziness    HPI Kim Fitzgerald is a 42 y.o. female.  Patient reports that she has not been feeling well for more than a month.  She reports that she has a history of connective tissue disorder, sees a specialist at [facility].  She attributes her symptoms to her chronic illness.  She reports that she has had poor appetite, weakness, intermittent headaches.  Recently, however, she has had episodic vertigo.  She will suddenly feel like she is spinning and it will last for several minutes and then resolved.  No hearing loss.  No persistent headache.      Past Medical History:  Diagnosis Date  . GERD (gastroesophageal reflux disease)   . H. pylori infection     treated twice  . Lupus   . Ovarian failure   . S/P colonoscopy 2006   [place] w/[place](PAC), 1 polyp-benign  . 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 Partial small bowel obstruction ([place]) 08/15/2017  . 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 Sjogren's disease ([place]) 08/15/2017  . GERD (gastroesophageal reflux disease) 06/30/2011  . History of colonic polyps 06/30/2011    Past Surgical History:  Procedure Laterality Date  . APPENDECTOMY    . CHOLECYSTECTOMY     cholelithiasis  . DILATATION & CURRETTAGE/HYSTEROSCOPY WITH RESECTOCOPE N/A 08/15/2014   Procedure: DILATATION & CURETTAGE, HYSTEROSCOPY WITH myosure;  Surgeon: Marvene Staff, MD;  Location: [facility];  Service: Gynecology;  Laterality: N/A;  . ESOPHAGOGASTRODUODENOSCOPY  07/16/2011   mild gastritis  . OVARIAN CYST REMOVAL  2010   laproscopic    OB History    No data available       Home Medications    Prior to Admission medications   Medication Sig Start Date End Date  Taking? Authorizing Provider  gabapentin (NEURONTIN) 300 MG capsule Take 1 capsule by mouth daily. 06/24/17  Yes [provider]  hydroxychloroquine (PLAQUENIL) 200 MG tablet Take 200 mg by mouth 2 (two) times daily.   Yes [provider]  predniSONE (DELTASONE) 5 MG tablet Take 5 mg by mouth daily.    Yes [provider]  dexlansoprazole (DEXILANT) 60 MG capsule Take 1 capsule (60 mg total) by mouth daily. Patient not taking: Reported on 08/15/2017 10/22/14   Mahala Menghini, PA-C  meclizine (ANTIVERT) 25 MG tablet Take 1 tablet (25 mg total) by mouth 3 (three) times daily as needed for dizziness. 02/05/18   Orpah Greek, MD  ondansetron (ZOFRAN ODT) 8 MG disintegrating tablet Take 1 tablet (8 mg total) by mouth every 8 (eight) hours as needed for nausea. 11/15/17   Ripley Fraise, MD    Family History Family History  Problem Relation Age of Onset  . Heart failure Mother   . Alcohol abuse Father   . Crohn's disease Brother 19    Social History Social History   Tobacco Use  . Smoking status: Former Smoker    Packs/day: 1.00    Years: 12.00    Pack years: 12.00    Types: E-cigarettes    Last attempt to quit: 07/11/2014    Years since quitting: 3.5  . Smokeless tobacco: Never Used  Substance Use Topics  . Alcohol use: No  .  Drug use: No     Allergies   Patient has no known allergies.   Review of Systems Review of Systems  Constitutional: Positive for appetite change.  Neurological: Positive for dizziness and headaches.  All other systems reviewed and are negative.    Physical Exam Updated Vital Signs BP 114/84   Pulse 74   Temp 97.8 F (36.6 C) (Oral)   Resp 17   Ht 5\' 6"  (1.676 m)   Wt 72.6 kg (160 lb)   LMP  (LMP Unknown) Comment: ovarian failure  SpO2 97%   BMI 25.82 kg/m   Physical Exam  Constitutional: She is oriented to person, place, and time. She appears well-developed and well-nourished. No distress.  HENT:    Head: Normocephalic and atraumatic.  Right Ear: Hearing normal.  Left Ear: Hearing normal.  Nose: Nose normal.  Mouth/Throat: Oropharynx is clear and moist and mucous membranes are normal.  Eyes: Conjunctivae and EOM are normal. Pupils are equal, round, and reactive to light.  Neck: Normal range of motion. Neck supple.  Cardiovascular: Regular rhythm, S1 normal and S2 normal. Exam reveals no gallop and no friction rub.  No murmur heard. Pulmonary/Chest: Effort normal and breath sounds normal. No respiratory distress. She exhibits no tenderness.  Abdominal: Soft. Normal appearance and bowel sounds are normal. There is no hepatosplenomegaly. There is no tenderness. There is no rebound, no guarding, no tenderness at McBurney's point and negative Murphy's sign. No hernia.  Musculoskeletal: Normal range of motion.  Neurological: She is alert and oriented to person, place, and time. She has normal strength. No cranial nerve deficit or sensory deficit. Coordination normal. GCS eye subscore is 4. GCS verbal subscore is 5. GCS motor subscore is 6.  Skin: Skin is warm, dry and intact. No rash noted. No cyanosis.  Psychiatric: She has a normal mood and affect. Her speech is normal and behavior is normal. Thought content normal.  Nursing note and vitals reviewed.    ED Treatments / Results  Labs (all labs ordered are listed, but only abnormal results are displayed) Labs Reviewed  CBC WITH DIFFERENTIAL/PLATELET - Abnormal; Notable for the following components:      Result Value   WBC 2.8 (*)    RBC 3.42 (*)    Hemoglobin 11.2 (*)    HCT 33.0 (*)    All other components within normal limits  BASIC METABOLIC PANEL - Abnormal; Notable for the following components:   Glucose, Bld 102 (*)    All other components within normal limits  URINALYSIS, ROUTINE W REFLEX MICROSCOPIC - Abnormal; Notable for the following components:   Color, Urine STRAW (*)    Specific Gravity, Urine 1.003 (*)    Hgb urine  dipstick SMALL (*)    Leukocytes, UA TRACE (*)    Squamous Epithelial / LPF 0-5 (*)    All other components within normal limits  URINE CULTURE  I-STAT BETA HCG BLOOD, ED ([facility], [facility], [facility] ONLY)    EKG  EKG Interpretation None       Radiology Ct Head Wo Contrast  Result Date: 02/05/2018 CLINICAL DATA:  Dizziness and vertigo. Vertigo, episodic, peripheral EXAM: CT HEAD WITHOUT CONTRAST TECHNIQUE: Contiguous axial images were obtained from the base of the skull through the vertex without intravenous contrast. COMPARISON:  None. FINDINGS: Brain: No intracranial hemorrhage, mass effect, or midline shift. No hydrocephalus. The basilar cisterns are patent. No evidence of territorial infarct or acute ischemia. No extra-axial or intracranial fluid collection. Vascular: No hyperdense vessel  or unexpected calcification. Skull: No fracture or focal lesion. Sinuses/Orbits: Paranasal sinuses and mastoid air cells are clear. The visualized orbits are unremarkable. Other: None. IMPRESSION: Normal noncontrast head CT. Electronically Signed   By: Jeb Levering M.D.   On: 02/05/2018 03:14    Procedures Procedures (including critical care time)  Medications Ordered in ED Medications  sodium chloride 0.9 % bolus 1,000 mL (0 mLs Intravenous Stopped 02/05/18 0343)     Initial Impression / Assessment and Plan / ED Course  I have reviewed the triage vital signs and the nursing notes.  Pertinent labs & imaging results that were available during my care of the patient were reviewed by me and considered in my medical decision making (see chart for details).     Patient presents for evaluation of dizziness.  She describes her dizziness as a sensation of spinning that occurs intermittently and lasts for a few minutes.  It is sometimes related to walking and moving her head.  This is consistent with a benign positional vertigo.  Her neurologic evaluation is normal here in the ER.  Head CT is normal.  Workup  unremarkable.  Patient reassured, will treat symptomatically with meclizine, however follow-up with her specialist at [facility] within the next 1 or 2 weeks.  Final Clinical Impressions(s) / ED Diagnoses   Final diagnoses:  Peripheral vertigo, unspecified laterality    ED Discharge Orders        Ordered    meclizine (ANTIVERT) 25 MG tablet  3 times daily PRN     02/05/18 0344       Orpah Greek, MD 02/05/18 0345

## 2018-02-06 LAB — URINE CULTURE: Culture: NO GROWTH

## 2018-04-29 ENCOUNTER — Other Ambulatory Visit: Payer: Self-pay

## 2018-04-29 ENCOUNTER — Encounter (HOSPITAL_COMMUNITY): Payer: Self-pay | Admitting: Emergency Medicine

## 2018-04-29 ENCOUNTER — Emergency Department (HOSPITAL_COMMUNITY): Payer: Medicare Other

## 2018-04-29 ENCOUNTER — Emergency Department (HOSPITAL_COMMUNITY)
Admission: EM | Admit: 2018-04-29 | Discharge: 2018-04-29 | Disposition: A | Discharge status: Home / Self Care | Payer: Medicare Other | Attending: Emergency Medicine | Admitting: Emergency Medicine

## 2018-04-29 DIAGNOSIS — M3210 Systemic lupus erythematosus, organ or system involvement unspecified: Secondary | ICD-10-CM | POA: Diagnosis not present

## 2018-04-29 DIAGNOSIS — R110 Nausea: Secondary | ICD-10-CM

## 2018-04-29 DIAGNOSIS — R112 Nausea with vomiting, unspecified: Secondary | ICD-10-CM | POA: Insufficient documentation

## 2018-04-29 DIAGNOSIS — R1084 Generalized abdominal pain: Secondary | ICD-10-CM | POA: Insufficient documentation

## 2018-04-29 DIAGNOSIS — K5900 Constipation, unspecified: Secondary | ICD-10-CM | POA: Diagnosis not present

## 2018-04-29 DIAGNOSIS — F17290 Nicotine dependence, other tobacco product, uncomplicated: Secondary | ICD-10-CM | POA: Insufficient documentation

## 2018-04-29 DIAGNOSIS — Z79899 Other long term (current) drug therapy: Secondary | ICD-10-CM | POA: Insufficient documentation

## 2018-04-29 LAB — CBC
HCT: 36.9 % (ref 36.0–46.0)
Hemoglobin: 12.9 g/dL (ref 12.0–15.0)
MCH: 35.1 pg — ABNORMAL HIGH (ref 26.0–34.0)
MCHC: 35 g/dL (ref 30.0–36.0)
MCV: 100.5 fL — ABNORMAL HIGH (ref 78.0–100.0)
Platelets: 162 10*3/uL (ref 150–400)
RBC: 3.67 MIL/uL — ABNORMAL LOW (ref 3.87–5.11)
RDW: 12.5 % (ref 11.5–15.5)
WBC: 4.3 10*3/uL (ref 4.0–10.5)

## 2018-04-29 LAB — COMPREHENSIVE METABOLIC PANEL
ALK PHOS: 79 U/L (ref 38–126)
ALT: 38 U/L (ref 14–54)
AST: 25 U/L (ref 15–41)
Albumin: 4.3 g/dL (ref 3.5–5.0)
Anion gap: 12 (ref 5–15)
BUN: 11 mg/dL (ref 6–20)
CALCIUM: 9.6 mg/dL (ref 8.9–10.3)
CHLORIDE: 102 mmol/L (ref 101–111)
CO2: 24 mmol/L (ref 22–32)
CREATININE: 0.68 mg/dL (ref 0.44–1.00)
GFR calc Af Amer: >60 mL/min (ref >60)
GFR calc non Af Amer: >60 mL/min (ref >60)
Glucose, Bld: 88 mg/dL (ref 65–99)
Potassium: 3.8 mmol/L (ref 3.5–5.1)
SODIUM: 138 mmol/L (ref 135–145)
Total Bilirubin: 0.8 mg/dL (ref 0.3–1.2)
Total Protein: 8.1 g/dL (ref 6.5–8.1)

## 2018-04-29 LAB — URINALYSIS, ROUTINE W REFLEX MICROSCOPIC
Bilirubin Urine: NEGATIVE
Glucose, UA: NEGATIVE mg/dL
Hgb urine dipstick: NEGATIVE
KETONES UR: NEGATIVE mg/dL
LEUKOCYTES UA: NEGATIVE
NITRITE: NEGATIVE
PH: 5 (ref 5.0–8.0)
Protein, ur: NEGATIVE mg/dL
Specific Gravity, Urine: 1.016 (ref 1.005–1.030)

## 2018-04-29 LAB — I-STAT BETA HCG BLOOD, ED (MC, WL, AP ONLY)

## 2018-04-29 LAB — LIPASE, BLOOD: LIPASE: 38 U/L (ref 11–51)

## 2018-04-29 MED ORDER — PROMETHAZINE HCL 25 MG/ML IJ SOLN
12.5000 mg | Freq: Once | INTRAMUSCULAR | Status: AC
  Administered 2018-04-29: 12.5 mg via INTRAVENOUS
  Filled 2018-04-29: qty 1

## 2018-04-29 MED ORDER — ONDANSETRON HCL 4 MG/2ML IJ SOLN: 4.0000 mg | Freq: Once | INTRAMUSCULAR | Status: DC

## 2018-04-29 MED ORDER — ONDANSETRON 4 MG PO TBDP
4.0000 mg | ORAL_TABLET | Freq: Once | ORAL | Status: AC
  Administered 2018-04-29: 4 mg via ORAL
  Filled 2018-04-29: qty 1

## 2018-04-29 MED ORDER — SODIUM CHLORIDE 0.9 % IV BOLUS
1000.0000 mL | Freq: Once | INTRAVENOUS | Status: AC
  Administered 2018-04-29: 1000 mL via INTRAVENOUS

## 2018-04-29 MED ORDER — POLYETHYLENE GLYCOL 3350 17 G PO PACK: 17.0000 g | PACK | Freq: Every day | ORAL | 0 refills | Status: DC

## 2018-04-29 NOTE — ED Notes (Signed)
Patient transported to Ultrasound 

## 2018-04-29 NOTE — ED Triage Notes (Signed)
Patient is complaining of red spots on her skin, sob, nausea, and vomiting. Patient states she has been having these problems since yesterday.

## 2018-04-29 NOTE — ED Provider Notes (Cosign Needed)
[facility] DEPT Provider Note   CSN: [id number] Arrival date & time: 04/29/18  0013     History   Chief Complaint Chief Complaint  Patient presents with  . Shortness of Breath  . Nausea  . Emesis  . Abdominal Pain    HPI Kim Fitzgerald is a 42 y.o. female.  The patient presents with complaint of nausea and vomiting. She does not feel she has had a fever but has "felt warm" intermittently. Symptoms started yesterday. No abdominal pain, diarrhea, or constipation. No SOB except during vomiting, and no CP.   The history is provided by the patient. No language interpreter was used.    Past Medical History:  Diagnosis Date  . GERD (gastroesophageal reflux disease)   . H. pylori infection     treated twice  . Lupus ([place])   . Ovarian failure   . S/P colonoscopy 2006   [place]-[facility] w/[place](PAC), 1 polyp-benign  . 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 Partial small bowel obstruction ([place]) 08/15/2017  . 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 Sjogren's disease ([place]) 08/15/2017  . GERD (gastroesophageal reflux disease) 06/30/2011  . History of colonic polyps 06/30/2011    Past Surgical History:  Procedure Laterality Date  . APPENDECTOMY    . CHOLECYSTECTOMY     cholelithiasis  . DILATATION & CURRETTAGE/HYSTEROSCOPY WITH RESECTOCOPE N/A 08/15/2014   Procedure: DILATATION & CURETTAGE, HYSTEROSCOPY WITH myosure;  Surgeon: Marvene Staff, MD;  Location: [facility];  Service: Gynecology;  Laterality: N/A;  . ESOPHAGOGASTRODUODENOSCOPY  07/16/2011   mild gastritis  . OVARIAN CYST REMOVAL  2010   laproscopic     OB History   None      Home Medications    Prior to Admission medications   Medication Sig Start Date End Date Taking? Authorizing Provider  dexlansoprazole (DEXILANT) 60 MG capsule Take 1 capsule (60 mg total) by mouth daily.  10/22/14  Yes Mahala Menghini, PA-C  gabapentin (NEURONTIN) 300 MG capsule Take 300 mg by mouth 3 (three) times daily.  06/24/17  Yes [provider]  hydroxychloroquine (PLAQUENIL) 200 MG tablet Take 200 mg by mouth 2 (two) times daily.   Yes [provider]  predniSONE (DELTASONE) 5 MG tablet Take 5 mg by mouth daily.    Yes [provider]  meclizine (ANTIVERT) 25 MG tablet Take 1 tablet (25 mg total) by mouth 3 (three) times daily as needed for dizziness. Patient not taking: Reported on 04/29/2018 02/05/18   Orpah Greek, MD    Family History Family History  Problem Relation Age of Onset  . Heart failure Mother   . Alcohol abuse Father   . Crohn's disease Brother 47    Social History Social History   Tobacco Use  . Smoking status: Former Smoker    Packs/day: 1.00    Years: 12.00    Pack years: 12.00    Types: E-cigarettes    Last attempt to quit: 07/11/2014    Years since quitting: 3.8  . Smokeless tobacco: Never Used  Substance Use Topics  . Alcohol use: No  . Drug use: No     Allergies   Patient has no known allergies.   Review of Systems Review of Systems  Constitutional: Negative for chills and fever.  HENT: Negative.   Respiratory: Negative.   Cardiovascular: Negative.  Negative for chest pain.  Gastrointestinal: Positive for  nausea and vomiting. Negative for abdominal pain.  Genitourinary: Negative.   Musculoskeletal: Negative.   Skin: Negative.   Neurological: Negative.      Physical Exam Updated Vital Signs BP 129/83 (BP Location: Left Arm)   Pulse 78   Temp 98.5 F (36.9 C) (Oral)   Resp 18   Ht 5\' 6"  (1.676 m)   Wt 70.3 kg (155 lb)   SpO2 100%   BMI 25.02 kg/m   Physical Exam  Constitutional: She is oriented to person, place, and time. She appears well-developed and well-nourished.  HENT:  Head: Normocephalic.  Mouth/Throat: Oropharynx is clear and moist.  Neck: Normal range of motion. Neck supple.    Cardiovascular: Normal rate and regular rhythm.  Pulmonary/Chest: Effort normal and breath sounds normal. She has no wheezes. She has no rhonchi. She has no rales.  Abdominal: Soft. Bowel sounds are normal. There is tenderness (diffuse tenderness to soft abdomen). There is no rebound and no guarding.  Musculoskeletal: Normal range of motion.  Neurological: She is alert and oriented to person, place, and time.  Skin: Skin is warm and dry. No rash noted.  Psychiatric: She has a normal mood and affect.     ED Treatments / Results  Labs (all labs ordered are listed, but only abnormal results are displayed) Labs Reviewed  CBC - Abnormal; Notable for the following components:      Result Value   RBC 3.67 (*)    MCV 100.5 (*)    MCH 35.1 (*)    All other components within normal limits  URINALYSIS, ROUTINE W REFLEX MICROSCOPIC - Abnormal; Notable for the following components:   APPearance HAZY (*)    All other components within normal limits  LIPASE, BLOOD  COMPREHENSIVE METABOLIC PANEL    EKG None  Radiology No results found.  Procedures Procedures (including critical care time)  Medications Ordered in ED Medications  ondansetron (ZOFRAN-ODT) disintegrating tablet 4 mg (4 mg Oral Given 04/29/18 0411)     Initial Impression / Assessment and Plan / ED Course  I have reviewed the triage vital signs and the nursing notes.  Pertinent labs & imaging results that were available during my care of the patient were reviewed by me and considered in my medical decision making (see chart for details).     Patient presents with nausea. She is found to have abdominal tenderness though diffuse and nonfocal. VSS.   She has a history of lupus and is concerned about symptoms. Zofran provided without relief. She is finding it hard to do a PO challenge.   There is no evidence of clinical dehydration but IVF"s ordered. Plain film of abdomen will be reviewed as she has history of partial  SBO. If she is able to tolerate PO fluids and plain film is negative for evidence of obstruction she can be discharged home with PCP follow up.   Patient care signed out to [facility], PA-C, pending re-evaluation after fluids and review of abdominal film.   Final Clinical Impressions(s) / ED Diagnoses   Final diagnoses:  None   1. Nausea   ED Discharge Orders    None       Charlann Lange, [place] 04/29/18 8676

## 2018-04-29 NOTE — ED Notes (Signed)
Patient transported to X-ray 

## 2018-04-29 NOTE — ED Provider Notes (Signed)
  Physical Exam  BP 121/80   Pulse 82   Temp 98.5 F (36.9 C) (Oral)   Resp 17   Ht 5\' 6"  (1.676 m)   Wt 70.3 kg (155 lb)   SpO2 100%   BMI 25.02 kg/m   Physical Exam  Constitutional: She appears well-developed and well-nourished. No distress.  HENT:  Head: Normocephalic and atraumatic.  Eyes: Conjunctivae and EOM are normal. No scleral icterus.  Neck: Normal range of motion.  Pulmonary/Chest: Effort normal. No respiratory distress.  Neurological: She is alert.  Skin: No rash noted. She is not diaphoretic.  Psychiatric: She has a normal mood and affect.  Nursing note and vitals reviewed.   ED Course/Procedures   Clinical Course as of Apr 29 1022  Sat Apr 29, 2018  0858 Patient returned from X-ray. States feels better with fluids and medications given. Will reassess after AXR returns.   [HK]    Clinical Course User Index [HK] Delia Heady, PA-C    Procedures  MDM  Care handed off from previous provider, S. Upstill, PA-C.  Please see their note for further detail.  Briefly, patient with a history of lupus, Sjogren's disease, history of partial small bowel obstruction, who presents to ED for evaluation of ongoing nausea since yesterday. Denies vomiting.  Reports subjective fever.  Reports normal bowel movements. On physical exam patient does not appear dehydrated.  Generalized abdominal pain noted.  Vital signs within normal limits.  Plan is to obtain abdominal x-ray to evaluate for obstruction, give IV fluids and reevaluate.  Patient given IV antiemetics.  1023: Abdominal x-ray shows signs of stool burden but no obstruction.  Patient is able to tolerate p.o. intake without difficulty.  Will give symptomatic treatment with MiraLAX and advised her to follow-up at the wellness center for further evaluation.  She continues to be symptom-free.  Advised to return to ED for any severe worsening symptoms.  Portions of this note were generated with [employer]. Dictation  errors may occur despite best attempts at proofreading.    Delia Heady, PA-C 04/29/18 1024    Davonna Belling, MD 04/29/18 1546

## 2018-10-04 ENCOUNTER — Emergency Department (HOSPITAL_COMMUNITY)
Admission: EM | Admit: 2018-10-04 | Discharge: 2018-10-04 | Disposition: A | Discharge status: Home / Self Care | Payer: Medicare Other | Attending: Emergency Medicine | Admitting: Emergency Medicine

## 2018-10-04 ENCOUNTER — Emergency Department (HOSPITAL_COMMUNITY): Payer: Medicare Other

## 2018-10-04 ENCOUNTER — Encounter (HOSPITAL_COMMUNITY): Payer: Self-pay

## 2018-10-04 ENCOUNTER — Other Ambulatory Visit: Payer: Self-pay

## 2018-10-04 DIAGNOSIS — R0602 Shortness of breath: Secondary | ICD-10-CM | POA: Insufficient documentation

## 2018-10-04 DIAGNOSIS — Z79899 Other long term (current) drug therapy: Secondary | ICD-10-CM | POA: Insufficient documentation

## 2018-10-04 DIAGNOSIS — Z87891 Personal history of nicotine dependence: Secondary | ICD-10-CM | POA: Insufficient documentation

## 2018-10-04 DIAGNOSIS — M359 Systemic involvement of connective tissue, unspecified: Secondary | ICD-10-CM | POA: Insufficient documentation

## 2018-10-04 LAB — CBC
HEMATOCRIT: 37 % (ref 36.0–46.0)
Hemoglobin: 12.8 g/dL (ref 12.0–15.0)
MCH: 35.6 pg — ABNORMAL HIGH (ref 26.0–34.0)
MCHC: 34.6 g/dL (ref 30.0–36.0)
MCV: 102.8 fL — AB (ref 80.0–100.0)
NRBC: 0 % (ref 0.0–0.2)
PLATELETS: 198 10*3/uL (ref 150–400)
RBC: 3.6 MIL/uL — AB (ref 3.87–5.11)
RDW: 11.4 % — ABNORMAL LOW (ref 11.5–15.5)
WBC: 8.2 10*3/uL (ref 4.0–10.5)

## 2018-10-04 LAB — BASIC METABOLIC PANEL
Anion gap: 6 (ref 5–15)
BUN: 13 mg/dL (ref 6–20)
CHLORIDE: 100 mmol/L (ref 98–111)
CO2: 29 mmol/L (ref 22–32)
Calcium: 9.4 mg/dL (ref 8.9–10.3)
Creatinine, Ser: 0.87 mg/dL (ref 0.44–1.00)
GFR calc non Af Amer: >60 mL/min (ref >60)
Glucose, Bld: 138 mg/dL — ABNORMAL HIGH (ref 70–99)
Potassium: 4.2 mmol/L (ref 3.5–5.1)
Sodium: 135 mmol/L (ref 135–145)

## 2018-10-04 LAB — D-DIMER, QUANTITATIVE (NOT AT ARMC): D DIMER QUANT: 0.84 ug{FEU}/mL — AB (ref 0.00–0.50)

## 2018-10-04 LAB — I-STAT TROPONIN, ED: Troponin i, poc: 0 ng/mL (ref 0.00–0.08)

## 2018-10-04 LAB — I-STAT BETA HCG BLOOD, ED (MC, WL, AP ONLY)

## 2018-10-04 LAB — BRAIN NATRIURETIC PEPTIDE: B Natriuretic Peptide: 7.4 pg/mL (ref 0.0–100.0)

## 2018-10-04 MED ORDER — ACETAMINOPHEN 325 MG PO TABS
650.0000 mg | ORAL_TABLET | Freq: Once | ORAL | Status: DC
  Filled 2018-10-04: qty 2

## 2018-10-04 MED ORDER — IBUPROFEN 400 MG PO TABS
600.0000 mg | ORAL_TABLET | Freq: Once | ORAL | Status: AC
  Administered 2018-10-04: 600 mg via ORAL
  Filled 2018-10-04: qty 1

## 2018-10-04 MED ORDER — IOPAMIDOL (ISOVUE-370) INJECTION 76%
INTRAVENOUS | Status: AC
  Filled 2018-10-04: qty 100

## 2018-10-04 MED ORDER — IOPAMIDOL (ISOVUE-370) INJECTION 76%
100.0000 mL | Freq: Once | INTRAVENOUS | Status: AC | PRN
  Administered 2018-10-04: 100 mL via INTRAVENOUS

## 2018-10-04 NOTE — ED Triage Notes (Signed)
Pt endorses shob x 3 days with "tingling all over and joint pain in my back" Speaking in complete sentences.

## 2018-10-04 NOTE — ED Provider Notes (Signed)
[facility] EMERGENCY DEPARTMENT Provider Note   CSN: [id number] Arrival date & time: 10/04/18  1538     History   Chief Complaint Chief Complaint  Patient presents with  . Shortness of Breath    HPI Kim Fitzgerald is a 42 y.o. female.  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  Started 2 days ago.  No clear cause.  She denies any cough or chest pain.  No fevers.  Yesterday she started noticing tingling/numbness to her bilateral toes and bilateral fingertips as well as the bottom of her feet.  She is had neuropathy before and is on gabapentin and this feels somewhat similar.  However is also having atraumatic right shoulder/back pain on the posterior aspect.  This is constant.  Shortness of breath seems to be constant no matter what she does and does not seem particularly worse when she gets up to walk.  No leg swelling.  Past Medical History:  Diagnosis Date  . GERD (gastroesophageal reflux disease)   . H. pylori infection     treated twice  . Lupus ([place])   . Ovarian failure   . S/P colonoscopy 2006   [place]-MD w/[place](PAC), 1 polyp-benign  . Undifferentiated connective tissue disease ([place]) 2017    Patient Active Problem List   Diagnosis Date Noted  . Partial small bowel obstruction ([place]) 08/15/2017  . Undifferentiated connective tissue disease ([place]) 08/15/2017  . Sjogren's disease ([place]) 08/15/2017  . GERD (gastroesophageal reflux disease) 06/30/2011  . History of colonic polyps 06/30/2011    Past Surgical History:  Procedure Laterality Date  . APPENDECTOMY    . CHOLECYSTECTOMY     cholelithiasis  . DILATATION & CURRETTAGE/HYSTEROSCOPY WITH RESECTOCOPE N/A 08/15/2014   Procedure: DILATATION & CURETTAGE, HYSTEROSCOPY WITH myosure;  Surgeon: Marvene Staff, MD;  Location: [facility];  Service: Gynecology;  Laterality:  N/A;  . ESOPHAGOGASTRODUODENOSCOPY  07/16/2011   mild gastritis  . OVARIAN CYST REMOVAL  2010   laproscopic     OB History   None      Home Medications    Prior to Admission medications   Medication Sig Start Date End Date Taking? Authorizing Provider  dexlansoprazole (DEXILANT) 60 MG capsule Take 1 capsule (60 mg total) by mouth daily. 10/22/14  Yes Mahala Menghini, PA-C  gabapentin (NEURONTIN) 300 MG capsule Take 300 mg by mouth 3 (three) times daily.  06/24/17  Yes [provider]  hydroxychloroquine (PLAQUENIL) 200 MG tablet Take 200 mg by mouth 2 (two) times daily.   Yes [provider]  predniSONE (DELTASONE) 5 MG tablet Take 10 mg by mouth 3 (three) times daily.    Yes [provider]  meclizine (ANTIVERT) 25 MG tablet Take 1 tablet (25 mg total) by mouth 3 (three) times daily as needed for dizziness. Patient not taking: Reported on 04/29/2018 02/05/18   Orpah Greek, MD  polyethylene glycol [facility]) packet Take 17 g by mouth daily. Patient not taking: Reported on 10/04/2018 04/29/18   Delia Heady, PA-C    Family History Family History  Problem Relation Age of Onset  . Heart failure Mother   . Alcohol abuse Father   . Crohn's disease Brother 44    Social History Social History   Tobacco Use  . Smoking status: Former Smoker    Packs/day: 1.00    Years: 12.00    Pack years: 12.00  Types: E-cigarettes    Last attempt to quit: 07/11/2014    Years since quitting: 4.2  . Smokeless tobacco: Never Used  Substance Use Topics  . Alcohol use: No  . Drug use: No     Allergies   Patient has no known allergies.   Review of Systems Review of Systems  Constitutional: Negative for fever.  HENT: Negative for congestion.   Respiratory: Positive for shortness of breath. Negative for cough.   Cardiovascular: Negative for chest pain and leg swelling.  Gastrointestinal: Negative for abdominal pain and vomiting.  Musculoskeletal:  Positive for back pain.  Neurological: Positive for numbness.  All other systems reviewed and are negative.    Physical Exam Updated Vital Signs BP 139/84   Pulse 99   Temp 98 F (36.7 C) (Oral)   Resp 16   Ht 5\' 6"  (1.676 m)   Wt 81.6 kg   SpO2 99%   BMI 29.05 kg/m   Physical Exam  Constitutional: She appears well-developed and well-nourished.  Non-toxic appearance. She does not appear ill. No distress.  HENT:  Head: Normocephalic and atraumatic.  Right Ear: External ear normal.  Left Ear: External ear normal.  Nose: Nose normal.  Eyes: Right eye exhibits no discharge. Left eye exhibits no discharge.  Cardiovascular: Normal rate, regular rhythm and normal heart sounds.  Pulmonary/Chest: Effort normal and breath sounds normal. No accessory muscle usage. No tachypnea. No respiratory distress.  Abdominal: Soft. There is no tenderness.  Musculoskeletal:       Cervical back: She exhibits tenderness.       Back:       Right lower leg: She exhibits no edema.       Left lower leg: She exhibits no edema.  Neurological: She is alert.  Normal strength and sensation in all 4 extremities.  Skin: Skin is warm and dry.  Psychiatric: Her mood appears not anxious.  Nursing note and vitals reviewed.    ED Treatments / Results  Labs (all labs ordered are listed, but only abnormal results are displayed) Labs Reviewed  BASIC METABOLIC PANEL - Abnormal; Notable for the following components:      Result Value   Glucose, Bld 138 (*)    All other components within normal limits  CBC - Abnormal; Notable for the following components:   RBC 3.60 (*)    MCV 102.8 (*)    MCH 35.6 (*)    RDW 11.4 (*)    All other components within normal limits  D-DIMER, QUANTITATIVE (NOT AT [facility]) - Abnormal; Notable for the following components:   D-Dimer, Quant 0.84 (*)    All other components within normal limits  BRAIN NATRIURETIC PEPTIDE  I-STAT TROPONIN, ED  I-STAT BETA HCG BLOOD, ED ([facility], [facility],  [facility] ONLY)    EKG EKG Interpretation  Date/Time:  Wednesday October 04 2018 15:58:57 EDT Ventricular Rate:  111 PR Interval:  122 QRS Duration: 72 QT Interval:  336 QTC Calculation: 456 R Axis:   65 Text Interpretation:  Sinus tachycardia no acute ST/T changes rate faster compared to Nov 2018 Confirmed by Sherwood Gambler [phone]) on 10/04/2018 4:57:54 PM   Radiology Dg Chest 2 View  Result Date: 10/04/2018 CLINICAL DATA:  Dyspnea and pain radiating to the shoulder and neck x3 days. EXAM: CHEST - 2 VIEW COMPARISON:  04/29/2018 FINDINGS: The heart size and mediastinal contours are within normal limits. Both lungs are clear. The visualized skeletal structures are unremarkable. IMPRESSION: No active cardiopulmonary disease. Electronically Signed  By: Ashley Royalty M.D.   On: 10/04/2018 18:50   Ct Angio Chest Pe W And/or Wo Contrast  Result Date: 10/04/2018 CLINICAL DATA:  Shortness of breath and back pain EXAM: CT ANGIOGRAPHY CHEST WITH CONTRAST TECHNIQUE: Multidetector CT imaging of the chest was performed using the standard protocol during bolus administration of intravenous contrast. Multiplanar CT image reconstructions and MIPs were obtained to evaluate the vascular anatomy. CONTRAST:  80 mL ISOVUE-370 IOPAMIDOL (ISOVUE-370) INJECTION 76% COMPARISON:  Chest x-ray 10/04/2018, CT chest 09/10/2016 FINDINGS: Cardiovascular: Satisfactory opacification of the pulmonary arteries to the segmental level. No evidence of pulmonary embolism. Normal heart size. No pericardial effusion. Nonaneurysmal aorta. No dissection seen. Mediastinum/Nodes: No enlarged mediastinal, hilar, or axillary lymph nodes. Thyroid gland, trachea, and esophagus demonstrate no significant findings. Lungs/Pleura: Lungs are clear. No pleural effusion or pneumothorax. Upper Abdomen: Surgical clips in the gallbladder fossa. No acute abnormality. Musculoskeletal: No chest wall abnormality. No acute or significant osseous findings. Review  of the MIP images confirms the above findings. IMPRESSION: Negative for acute pulmonary embolus or aortic dissection. Clear lung fields. Electronically Signed   By: Donavan Foil M.D.   On: 10/04/2018 20:55    Procedures Procedures (including critical care time)  Medications Ordered in ED Medications  iopamidol (ISOVUE-370) 76 % injection (has no administration in time range)  ibuprofen (ADVIL,MOTRIN) tablet 600 mg (600 mg Oral Given 10/04/18 1809)  iopamidol (ISOVUE-370) 76 % injection 100 mL (100 mLs Intravenous Contrast Given 10/04/18 2013)     Initial Impression / Assessment and Plan / ED Course  I have reviewed the triage vital signs and the nursing notes.  Pertinent labs & imaging results that were available during my care of the patient were reviewed by me and considered in my medical decision making (see chart for details).     No clear cause for the patient's dyspnea.  Of note, she does not appear to have any increased work of breathing or signs of shortness of breath.  Labs are reassuring besides mildly elevated d-dimer.  Given no clear cause CT obtained as she is not low risk given the elevated heart rate.  CT without acute abnormality including no pneumonia, pulmonary edema, or PE.  Given all this, while there is no clear cause of her dyspnea, she does not appear to have an emergent cause of dyspnea and appears stable for discharge home to follow-up with PCP.  I doubt ACS.  ECG and troponin are reassuring.  Final Clinical Impressions(s) / ED Diagnoses   Final diagnoses:  Shortness of breath    ED Discharge Orders    None       Sherwood Gambler, MD 10/04/18 2124

## 2018-10-04 NOTE — ED Notes (Signed)
Patient verbalizes understanding of discharge instructions. Opportunity for questioning and answers were provided. Armband removed by staff, pt discharged from ED.  

## 2018-10-04 NOTE — ED Notes (Signed)
Patient transported to CT 

## 2018-10-04 NOTE — Discharge Instructions (Addendum)
If you develop worsening shortness of breath, chest pain, fever, vomiting, or any other new/concerning symptoms or return to the ER for evaluation.

## 2018-10-04 NOTE — ED Notes (Signed)
ED Provider at bedside. 

## 2018-10-04 NOTE — ED Notes (Signed)
Patient transported to X-ray 

## 2019-01-21 ENCOUNTER — Emergency Department (HOSPITAL_COMMUNITY): Payer: Medicare Other

## 2019-01-21 ENCOUNTER — Emergency Department (HOSPITAL_COMMUNITY)
Admission: EM | Admit: 2019-01-21 | Discharge: 2019-01-22 | Disposition: A | Discharge status: Home / Self Care | Payer: Medicare Other | Attending: Emergency Medicine | Admitting: Emergency Medicine

## 2019-01-21 ENCOUNTER — Encounter (HOSPITAL_COMMUNITY): Payer: Self-pay

## 2019-01-21 ENCOUNTER — Other Ambulatory Visit: Payer: Self-pay

## 2019-01-21 DIAGNOSIS — D72819 Decreased white blood cell count, unspecified: Secondary | ICD-10-CM | POA: Insufficient documentation

## 2019-01-21 DIAGNOSIS — Z79899 Other long term (current) drug therapy: Secondary | ICD-10-CM | POA: Diagnosis not present

## 2019-01-21 DIAGNOSIS — R112 Nausea with vomiting, unspecified: Secondary | ICD-10-CM | POA: Insufficient documentation

## 2019-01-21 DIAGNOSIS — E876 Hypokalemia: Secondary | ICD-10-CM | POA: Diagnosis not present

## 2019-01-21 DIAGNOSIS — R1084 Generalized abdominal pain: Secondary | ICD-10-CM | POA: Diagnosis present

## 2019-01-21 DIAGNOSIS — I10 Essential (primary) hypertension: Secondary | ICD-10-CM | POA: Diagnosis not present

## 2019-01-21 DIAGNOSIS — Z87891 Personal history of nicotine dependence: Secondary | ICD-10-CM | POA: Insufficient documentation

## 2019-01-21 LAB — COMPREHENSIVE METABOLIC PANEL
ALK PHOS: 62 U/L (ref 38–126)
ALT: 19 U/L (ref 0–44)
ANION GAP: 13 (ref 5–15)
AST: 21 U/L (ref 15–41)
Albumin: 3.9 g/dL (ref 3.5–5.0)
BILIRUBIN TOTAL: 1.3 mg/dL — AB (ref 0.3–1.2)
BUN: 7 mg/dL (ref 6–20)
CALCIUM: 9.7 mg/dL (ref 8.9–10.3)
CO2: 27 mmol/L (ref 22–32)
Chloride: 99 mmol/L (ref 98–111)
Creatinine, Ser: 1.02 mg/dL — ABNORMAL HIGH (ref 0.44–1.00)
GFR calc non Af Amer: >60 mL/min (ref >60)
Glucose, Bld: 100 mg/dL — ABNORMAL HIGH (ref 70–99)
POTASSIUM: 3 mmol/L — AB (ref 3.5–5.1)
Sodium: 139 mmol/L (ref 135–145)
Total Protein: 8 g/dL (ref 6.5–8.1)

## 2019-01-21 LAB — URINALYSIS, ROUTINE W REFLEX MICROSCOPIC
BILIRUBIN URINE: NEGATIVE
Glucose, UA: NEGATIVE mg/dL
HGB URINE DIPSTICK: NEGATIVE
KETONES UR: NEGATIVE mg/dL
Leukocytes, UA: NEGATIVE
NITRITE: NEGATIVE
PH: 6 (ref 5.0–8.0)
Protein, ur: NEGATIVE mg/dL
Specific Gravity, Urine: >1.046 — ABNORMAL HIGH (ref 1.005–1.030)

## 2019-01-21 LAB — CBC WITH DIFFERENTIAL/PLATELET
Abs Immature Granulocytes: 0.01 10*3/uL (ref 0.00–0.07)
BASOS ABS: 0 10*3/uL (ref 0.0–0.1)
BASOS PCT: 0 %
EOS ABS: 0 10*3/uL (ref 0.0–0.5)
EOS PCT: 1 %
HEMATOCRIT: 36.4 % (ref 36.0–46.0)
Hemoglobin: 12.4 g/dL (ref 12.0–15.0)
Immature Granulocytes: 0 %
Lymphocytes Relative: 21 %
Lymphs Abs: 0.8 10*3/uL (ref 0.7–4.0)
MCH: 32.5 pg (ref 26.0–34.0)
MCHC: 34.1 g/dL (ref 30.0–36.0)
MCV: 95.5 fL (ref 80.0–100.0)
Monocytes Absolute: 0.3 10*3/uL (ref 0.1–1.0)
Monocytes Relative: 8 %
NEUTROS PCT: 70 %
NRBC: 0 % (ref 0.0–0.2)
Neutro Abs: 2.6 10*3/uL (ref 1.7–7.7)
Platelets: 197 10*3/uL (ref 150–400)
RBC: 3.81 MIL/uL — ABNORMAL LOW (ref 3.87–5.11)
RDW: 11 % — ABNORMAL LOW (ref 11.5–15.5)
WBC: 3.7 10*3/uL — AB (ref 4.0–10.5)

## 2019-01-21 LAB — LIPASE, BLOOD: LIPASE: 34 U/L (ref 11–51)

## 2019-01-21 LAB — I-STAT BETA HCG BLOOD, ED (MC, WL, AP ONLY)

## 2019-01-21 MED ORDER — MORPHINE SULFATE (PF) 4 MG/ML IV SOLN
4.0000 mg | Freq: Once | INTRAVENOUS | Status: AC
  Administered 2019-01-21: 4 mg via INTRAVENOUS
  Filled 2019-01-21: qty 1

## 2019-01-21 MED ORDER — POTASSIUM CHLORIDE CRYS ER 20 MEQ PO TBCR
40.0000 meq | EXTENDED_RELEASE_TABLET | Freq: Once | ORAL | Status: AC
  Administered 2019-01-21: 40 meq via ORAL
  Filled 2019-01-21: qty 2

## 2019-01-21 MED ORDER — PROMETHAZINE HCL 25 MG/ML IJ SOLN
25.0000 mg | Freq: Once | INTRAMUSCULAR | Status: AC
  Administered 2019-01-21: 25 mg via INTRAVENOUS
  Filled 2019-01-21: qty 1

## 2019-01-21 MED ORDER — ONDANSETRON HCL 4 MG/2ML IJ SOLN
4.0000 mg | Freq: Once | INTRAMUSCULAR | Status: AC
  Administered 2019-01-21: 4 mg via INTRAVENOUS
  Filled 2019-01-21: qty 2

## 2019-01-21 MED ORDER — ONDANSETRON 4 MG PO TBDP: 4.0000 mg | ORAL_TABLET | Freq: Three times a day (TID) | ORAL | 0 refills | Status: DC | PRN

## 2019-01-21 MED ORDER — IOHEXOL 300 MG/ML  SOLN
100.0000 mL | Freq: Once | INTRAMUSCULAR | Status: AC | PRN
  Administered 2019-01-21: 100 mL via INTRAVENOUS

## 2019-01-21 MED ORDER — SODIUM CHLORIDE 0.9 % IV BOLUS
1000.0000 mL | Freq: Once | INTRAVENOUS | Status: AC
  Administered 2019-01-21: 1000 mL via INTRAVENOUS

## 2019-01-21 NOTE — ED Provider Notes (Signed)
[facility] EMERGENCY DEPARTMENT Provider Note   CSN: [id number] Arrival date & time: 01/21/19  1758     History   Chief Complaint Chief Complaint  Patient presents with  . Nausea  . Emesis  . Abdominal Pain    HPI Kim Fitzgerald is a 43 y.o. female with past medical history of ovarian failure, undifferentiated connective tissue disease, Sojourn's, lupus, who presents today for evaluation of abdominal pain, nausea, vomiting, and chills for 3 days.  She reports that she has been unable to eat for the past 3 days.  She vomited once today.She denies any diarrhea.    She reports that her abdominal pain is in the middle of her abdomen, has not changed or moved.  She reports increased urinary frequency, however denies urgency, dysuria or hematuria.     HPI  Past Medical History:  Diagnosis Date  . GERD (gastroesophageal reflux disease)   . H. pylori infection     treated twice  . Lupus ([place])   . Ovarian failure   . S/P colonoscopy 2006   [place]-MD w/[place](PAC), 1 polyp-benign  . Undifferentiated connective tissue disease ([place]) 2017    Patient Active Problem List   Diagnosis Date Noted  . Partial small bowel obstruction ([place]) 08/15/2017  . Undifferentiated connective tissue disease ([place]) 08/15/2017  . Sjogren's disease ([place]) 08/15/2017  . GERD (gastroesophageal reflux disease) 06/30/2011  . History of colonic polyps 06/30/2011    Past Surgical History:  Procedure Laterality Date  . APPENDECTOMY    . CHOLECYSTECTOMY     cholelithiasis  . DILATATION & CURRETTAGE/HYSTEROSCOPY WITH RESECTOCOPE N/A 08/15/2014   Procedure: DILATATION & CURETTAGE, HYSTEROSCOPY WITH myosure;  Surgeon: Marvene Staff, MD;  Location: [facility];  Service: Gynecology;  Laterality: N/A;  . ESOPHAGOGASTRODUODENOSCOPY  07/16/2011   mild gastritis  . OVARIAN CYST REMOVAL  2010   laproscopic     OB History   No obstetric history on file.      Home Medications     Prior to Admission medications   Medication Sig Start Date End Date Taking? Authorizing Provider  dexlansoprazole (DEXILANT) 60 MG capsule Take 1 capsule (60 mg total) by mouth daily. 10/22/14  Yes Mahala Menghini, PA-C  gabapentin (NEURONTIN) 300 MG capsule Take 300 mg by mouth 3 (three) times daily.  06/24/17  Yes [provider]  hydroxychloroquine (PLAQUENIL) 200 MG tablet Take 200 mg by mouth 2 (two) times daily.   Yes [provider]  ibuprofen (ADVIL,MOTRIN) 200 MG tablet Take 400 mg by mouth every 6 (six) hours as needed for moderate pain.   Yes [provider]  predniSONE (DELTASONE) 5 MG tablet Take 10 mg by mouth 3 (three) times daily.    Yes [provider]  meclizine (ANTIVERT) 25 MG tablet Take 1 tablet (25 mg total) by mouth 3 (three) times daily as needed for dizziness. Patient not taking: Reported on 04/29/2018 02/05/18   Orpah Greek, MD  ondansetron (ZOFRAN ODT) 4 MG disintegrating tablet Take 1 tablet (4 mg total) by mouth every 8 (eight) hours as needed for nausea or vomiting. 01/21/19   Lorin Glass, PA-C  polyethylene glycol [facility]) packet Take 17 g by mouth daily. Patient not taking: Reported on 10/04/2018 04/29/18   Delia Heady, PA-C    Family History Family History  Problem Relation Age of Onset  . Heart failure Mother   . Alcohol abuse Father   . Crohn's disease Brother 80  Social History Social History   Tobacco Use  . Smoking status: Former Smoker    Packs/day: 1.00    Years: 12.00    Pack years: 12.00    Types: E-cigarettes    Last attempt to quit: 07/11/2014    Years since quitting: 4.5  . Smokeless tobacco: Never Used  Substance Use Topics  . Alcohol use: No  . Drug use: No     Allergies   Patient has no known allergies.   Review of Systems Review of Systems  Constitutional: Positive for chills. Negative for fever.  HENT: Negative for congestion.   Respiratory: Negative for cough and  shortness of breath.   Cardiovascular: Negative for chest pain.  Gastrointestinal: Positive for abdominal pain, nausea and vomiting. Negative for constipation and diarrhea.  Musculoskeletal: Negative for back pain and neck pain.  Neurological: Negative for weakness, light-headedness and headaches.     Physical Exam Updated Vital Signs BP 113/77 (BP Location: Right Arm)   Pulse 83   Temp 98.5 F (36.9 C)   Resp 18   Ht 5\' 6"  (1.676 m)   Wt 77.1 kg   SpO2 100%   BMI 27.44 kg/m   Physical Exam Vitals signs and nursing note reviewed.  Constitutional:      General: She is not in acute distress.    Appearance: She is well-developed.  HENT:     Head: Normocephalic and atraumatic.     Mouth/Throat:     Mouth: Mucous membranes are moist.  Eyes:     Conjunctiva/sclera: Conjunctivae normal.  Neck:     Musculoskeletal: Neck supple.  Cardiovascular:     Rate and Rhythm: Normal rate and regular rhythm.     Heart sounds: Normal heart sounds. No murmur.  Pulmonary:     Effort: Pulmonary effort is normal. No respiratory distress.     Breath sounds: Normal breath sounds.  Abdominal:     General: A surgical scar is present. Bowel sounds are normal. There is no distension.     Palpations: Abdomen is soft.     Tenderness: There is abdominal tenderness in the epigastric area and periumbilical area. There is no right CVA tenderness or left CVA tenderness.     Hernia: No hernia is present.  Skin:    General: Skin is warm and dry.  Neurological:     General: No focal deficit present.     Mental Status: She is alert.     Motor: No weakness.  Psychiatric:        Mood and Affect: Mood normal.        Behavior: Behavior normal.      ED Treatments / Results  Labs (all labs ordered are listed, but only abnormal results are displayed) Labs Reviewed  COMPREHENSIVE METABOLIC PANEL - Abnormal; Notable for the following components:      Result Value   Potassium 3.0 (*)    Glucose, Bld 100  (*)    Creatinine, Ser 1.02 (*)    Total Bilirubin 1.3 (*)    All other components within normal limits  CBC WITH DIFFERENTIAL/PLATELET - Abnormal; Notable for the following components:   WBC 3.7 (*)    RBC 3.81 (*)    RDW 11.0 (*)    All other components within normal limits  URINALYSIS, ROUTINE W REFLEX MICROSCOPIC - Abnormal; Notable for the following components:   Specific Gravity, Urine >1.046 (*)    All other components within normal limits  LIPASE, BLOOD  I-STAT BETA HCG  BLOOD, ED ([facility], [facility], [facility] ONLY)    EKG EKG Interpretation  Date/Time:  Sunday January 21 2019 18:09:05 EST Ventricular Rate:  97 PR Interval:    QRS Duration: 90 QT Interval:  377 QTC Calculation: 479 R Axis:   69 Text Interpretation:  Sinus rhythm Borderline T wave abnormalities Borderline ST elevation, lateral leads When compared to prior, no significant changes seen. similar delta wave like appearance to prior.  No STEMI Confirmed by Antony Blackbird [phone]) on 01/21/2019 6:11:55 PM   Radiology Ct Abdomen Pelvis W Contrast  Result Date: 01/21/2019 CLINICAL DATA:  Abdominal pain, nausea, vomiting, and chills for 3 days. Tenderness on palpation. Unable to eat for 3 days. EXAM: CT ABDOMEN AND PELVIS WITH CONTRAST TECHNIQUE: Multidetector CT imaging of the abdomen and pelvis was performed using the standard protocol following bolus administration of intravenous contrast. CONTRAST:  160mL OMNIPAQUE IOHEXOL 300 MG/ML  SOLN COMPARISON:  08/15/2017 FINDINGS: Lower chest: Lung bases are clear. Hepatobiliary: No focal liver lesions. Surgical absence of the gallbladder. Bile duct dilatation is likely physiologic post cholecystectomy. No change. Pancreas: Unremarkable. No pancreatic ductal dilatation or surrounding inflammatory changes. Spleen: Normal in size without focal abnormality. Adrenals/Urinary Tract: Adrenal glands are unremarkable. Kidneys are normal, without renal calculi, focal lesion, or hydronephrosis. Bladder is  unremarkable. Stomach/Bowel: Stomach, small bowel, and colon are not abnormally distended. No wall thickening is appreciated. Scattered stool in the colon. Appendix is not identified. Vascular/Lymphatic: No significant vascular findings are present. Minimal scattered aortic calcifications. No enlarged abdominal or pelvic lymph nodes. Reproductive: Uterus and bilateral adnexa are unremarkable. Other: No abdominal wall hernia or abnormality. No abdominopelvic ascites. Musculoskeletal: No acute or significant osseous findings. IMPRESSION: No acute process demonstrated in the abdomen or pelvis. No evidence of bowel obstruction or inflammation. Electronically Signed   By: Lucienne Capers M.D.   On: 01/21/2019 20:05    Procedures Procedures (including critical care time)  Medications Ordered in ED Medications  sodium chloride 0.9 % bolus 1,000 mL (0 mLs Intravenous Stopped 01/21/19 2110)  morphine 4 MG/ML injection 4 mg (4 mg Intravenous Given 01/21/19 1843)  ondansetron (ZOFRAN) injection 4 mg (4 mg Intravenous Given 01/21/19 1842)  iohexol (OMNIPAQUE) 300 MG/ML solution 100 mL (100 mLs Intravenous Contrast Given 01/21/19 1954)  potassium chloride SA (K-DUR,KLOR-CON) CR tablet 40 mEq (40 mEq Oral Given 01/21/19 2303)  promethazine (PHENERGAN) injection 25 mg (25 mg Intravenous Given 01/21/19 2306)     Initial Impression / Assessment and Plan / ED Course  I have reviewed the triage vital signs and the nursing notes.  Pertinent labs & imaging results that were available during my care of the patient were reviewed by me and considered in my medical decision making (see chart for details).    Patient is a 43 year old woman who presents today for evaluation of generalized abdominal pain for the past 3 days.  With this she has had decreased appetite and vomited once today.  Labs were obtained.  Her potassium is slightly low at 3.0, suspect that this is related to poor p.o. intake combined with GI loss, she was  given K-Dur while in the emergency room and given instructions on eating high potassium foods.  She had mild leukopenia with a white count of 3.7.  Chart review shows that that is not abnormal for her.  Her urine was concentrated, however did not have evidence of infection.  She did not have any other significant electrolyte or hematologic derangements.  Her CT scan showed no  evidence of acute process or cause for her pain found.  Her dehydration was treated in the emergency room with 1 L of IV saline and p.o. fluids.  Her pain was treated with morphine.  Her nausea and vomiting were treated with Zofran and Phenergan.  After this she was able to p.o. challenge successfully and stated she was ready for discharge home.  She is given prescription for Zofran at home as needed.  Suspect gastritis, most likely viral.  Return precautions were discussed with patient who states their understanding.  At the time of discharge patient denied any unaddressed complaints or concerns.  Patient is agreeable for discharge home.   Final Clinical Impressions(s) / ED Diagnoses   Final diagnoses:  Generalized abdominal pain  Non-intractable vomiting with nausea, unspecified vomiting type  Hypokalemia  Hypertension, unspecified type  Leukopenia, unspecified type    ED Discharge Orders         Ordered    ondansetron (ZOFRAN ODT) 4 MG disintegrating tablet  Every 8 hours PRN     01/21/19 2345           Lorin Glass, [place] 01/22/19 0024    Tegeler, Gwenyth Allegra, MD 01/22/19 [phone]

## 2019-01-21 NOTE — ED Triage Notes (Signed)
Pt presents with c/o abdominal pain, NV, and chills x3 days. Pt endorses tenderness on palpation. Pt states she has not been able to eat for three days as well. Pt denies diarrhea, skin warm and dry. A+Ox4, in NAD on arrival.

## 2019-01-21 NOTE — Discharge Instructions (Addendum)
Please make sure that you are drinking plenty of fluids.  Today your CT scan was reassuring.  Your potassium was slightly low, I suspect that this is due to your vomiting.  Please make sure that you are eating high potassium foods.  Bananas are a good food that normally do not cause too much stomach upset.  Your white blood cell count was low today.  Please follow up with your primary care doctor.

## 2019-10-07 ENCOUNTER — Other Ambulatory Visit: Payer: Self-pay

## 2019-10-07 ENCOUNTER — Encounter (HOSPITAL_COMMUNITY): Payer: Self-pay | Admitting: Emergency Medicine

## 2019-10-07 ENCOUNTER — Emergency Department (HOSPITAL_COMMUNITY)
Admission: EM | Admit: 2019-10-07 | Discharge: 2019-10-07 | Disposition: A | Discharge status: Home / Self Care | Payer: Medicare Other | Attending: Emergency Medicine | Admitting: Emergency Medicine

## 2019-10-07 ENCOUNTER — Emergency Department (HOSPITAL_COMMUNITY): Payer: Medicare Other

## 2019-10-07 DIAGNOSIS — Z79899 Other long term (current) drug therapy: Secondary | ICD-10-CM | POA: Diagnosis not present

## 2019-10-07 DIAGNOSIS — R519 Headache, unspecified: Secondary | ICD-10-CM | POA: Insufficient documentation

## 2019-10-07 DIAGNOSIS — Z87891 Personal history of nicotine dependence: Secondary | ICD-10-CM | POA: Insufficient documentation

## 2019-10-07 LAB — COMPREHENSIVE METABOLIC PANEL
ALT: 28 U/L (ref 0–44)
AST: 21 U/L (ref 15–41)
Albumin: 3.9 g/dL (ref 3.5–5.0)
Alkaline Phosphatase: 84 U/L (ref 38–126)
Anion gap: 11 (ref 5–15)
BUN: 11 mg/dL (ref 6–20)
CO2: 26 mmol/L (ref 22–32)
Calcium: 9.5 mg/dL (ref 8.9–10.3)
Chloride: 101 mmol/L (ref 98–111)
Creatinine, Ser: 0.96 mg/dL (ref 0.44–1.00)
GFR calc Af Amer: >60 mL/min (ref >60)
GFR calc non Af Amer: >60 mL/min (ref >60)
Glucose, Bld: 101 mg/dL — ABNORMAL HIGH (ref 70–99)
Potassium: 3.5 mmol/L (ref 3.5–5.1)
Sodium: 138 mmol/L (ref 135–145)
Total Bilirubin: 1.5 mg/dL — ABNORMAL HIGH (ref 0.3–1.2)
Total Protein: 7.7 g/dL (ref 6.5–8.1)

## 2019-10-07 LAB — CBC WITH DIFFERENTIAL/PLATELET
Abs Immature Granulocytes: 0.01 10*3/uL (ref 0.00–0.07)
Basophils Absolute: 0 10*3/uL (ref 0.0–0.1)
Basophils Relative: 0 %
Eosinophils Absolute: 0 10*3/uL (ref 0.0–0.5)
Eosinophils Relative: 1 %
HCT: 35.8 % — ABNORMAL LOW (ref 36.0–46.0)
Hemoglobin: 12.5 g/dL (ref 12.0–15.0)
Immature Granulocytes: 0 %
Lymphocytes Relative: 16 %
Lymphs Abs: 0.7 10*3/uL (ref 0.7–4.0)
MCH: 34.9 pg — ABNORMAL HIGH (ref 26.0–34.0)
MCHC: 34.9 g/dL (ref 30.0–36.0)
MCV: 100 fL (ref 80.0–100.0)
Monocytes Absolute: 0.3 10*3/uL (ref 0.1–1.0)
Monocytes Relative: 7 %
Neutro Abs: 3.2 10*3/uL (ref 1.7–7.7)
Neutrophils Relative %: 76 %
Platelets: 184 10*3/uL (ref 150–400)
RBC: 3.58 MIL/uL — ABNORMAL LOW (ref 3.87–5.11)
RDW: 12.1 % (ref 11.5–15.5)
WBC: 4.2 10*3/uL (ref 4.0–10.5)
nRBC: 0 % (ref 0.0–0.2)

## 2019-10-07 LAB — I-STAT BETA HCG BLOOD, ED (MC, WL, AP ONLY): I-stat hCG, quantitative: <5 m[IU]/mL (ref <5)

## 2019-10-07 MED ORDER — SODIUM CHLORIDE 0.9 % IV BOLUS
500.0000 mL | Freq: Once | INTRAVENOUS | Status: AC
  Administered 2019-10-07: 13:00:00 500 mL via INTRAVENOUS

## 2019-10-07 MED ORDER — METOCLOPRAMIDE HCL 5 MG/ML IJ SOLN
10.0000 mg | Freq: Once | INTRAMUSCULAR | Status: AC
  Administered 2019-10-07: 13:00:00 10 mg via INTRAVENOUS
  Filled 2019-10-07: qty 2

## 2019-10-07 MED ORDER — DIPHENHYDRAMINE HCL 50 MG/ML IJ SOLN
12.5000 mg | Freq: Once | INTRAMUSCULAR | Status: AC
  Administered 2019-10-07: 12.5 mg via INTRAVENOUS
  Filled 2019-10-07: qty 1

## 2019-10-07 NOTE — Discharge Instructions (Addendum)
You have been diagnosed today with Headache.  At this time there does not appear to be the presence of an emergent medical condition, however there is always the potential for conditions to change. Please read and follow the below instructions.  Please return to the Emergency Department immediately for any new or worsening symptoms. Please be sure to follow up with your Primary Care Provider within one week regarding your visit today; please call their office to schedule an appointment even if you are feeling better for a follow-up visit. Please be sure to drink plenty of water and get plenty of rest.  Please take all of your home medications as prescribed by your specialists and primary care provider.  Call their offices tomorrow morning to schedule follow-up appointments.  Get help right away if: Your headache gets very bad quickly. Your headache gets worse after a lot of physical activity. You keep throwing up. You have a stiff neck. You have trouble seeing. You have trouble speaking. You have pain in the eye or ear. Your muscles are weak or you lose muscle control. You lose your balance or have trouble walking. You feel like you will pass out (faint) or you pass out. You are mixed up (confused). You have a seizure. You have any new/concerning or worsening of symptoms  Please read the additional information packets attached to your discharge summary.  Do not take your medicine if  develop an itchy rash, swelling in your mouth or lips, or difficulty breathing; call 911 and seek immediate emergency medical attention if this occurs.  Note: Portions of this text may have been transcribed using voice recognition software. Every effort was made to ensure accuracy; however, inadvertent computerized transcription errors may still be present.

## 2019-10-07 NOTE — ED Notes (Signed)
Patient transported to CT 

## 2019-10-07 NOTE — ED Triage Notes (Signed)
Pt endorses frontal head pain when waking today. States the pain has mostly gone away

## 2019-10-07 NOTE — ED Provider Notes (Signed)
[facility] EMERGENCY DEPARTMENT Provider Note   CSN: [id number] Arrival date & time: 10/07/19  1122     History   Chief Complaint Chief Complaint  Patient presents with  . Headache    HPI Kim Fitzgerald is a 43 y.o. female with history of lupus, undifferentiated cognitive tissue disease, Sjogren's, GERD presents today for headache.  Patient reports that she was sitting at home and not exerting herself today when she developed a frontal headache, bilateral sharp pounding sensation constant severe in intensity without aggravating or alleviating factors.  Pain began around 8 AM and began to resolve shortly after ED arrival around 11:15 AM.  She reports pain is now mild.  Patient took 1 of her daily prednisone on onset of symptoms without relief.  Patient reports history of similar headache in the past but reports not this severe.  Denies fever/chills, vision changes, neck pain/stiffness, chest pain/shortness of breath, nausea/vomiting, abdominal pain, fall/injury, trismus or any additional concerns.    HPI  Past Medical History:  Diagnosis Date  . GERD (gastroesophageal reflux disease)   . H. pylori infection     treated twice  . Lupus ([place])   . Ovarian failure   . S/P colonoscopy 2006   [place] w/Luk[place](PAC), 1 polyp-benign  . Undifferentiated connective tissue disease ([place]) 2017    Patient Active Problem List   Diagnosis Date Noted  . Partial small bowel obstruction ([place]) 08/15/2017  . Undifferentiated connective tissue disease ([place]) 08/15/2017  . Sjogren's disease ([place]) 08/15/2017  . GERD (gastroesophageal reflux disease) 06/30/2011  . History of colonic polyps 06/30/2011    Past Surgical History:  Procedure Laterality Date  . APPENDECTOMY    . CHOLECYSTECTOMY     cholelithiasis  . DILATATION & CURRETTAGE/HYSTEROSCOPY WITH RESECTOCOPE N/A 08/15/2014   Procedure: DILATATION & CURETTAGE, HYSTEROSCOPY WITH myosure;  Surgeon: Marvene Staff, MD;  Location: [facility];  Service: Gynecology;  Laterality: N/A;  . ESOPHAGOGASTRODUODENOSCOPY  07/16/2011   mild gastritis  . OVARIAN CYST REMOVAL  2010   laproscopic     OB History   No obstetric history on file.      Home Medications    Prior to Admission medications   Medication Sig Start Date End Date Taking? Authorizing Provider  dexlansoprazole (DEXILANT) 60 MG capsule Take 1 capsule (60 mg total) by mouth daily. 10/22/14   Mahala Menghini, PA-C  gabapentin (NEURONTIN) 300 MG capsule Take 300 mg by mouth 3 (three) times daily.  06/24/17   [provider]  hydroxychloroquine (PLAQUENIL) 200 MG tablet Take 200 mg by mouth 2 (two) times daily.    [provider]  ibuprofen (ADVIL,MOTRIN) 200 MG tablet Take 400 mg by mouth every 6 (six) hours as needed for moderate pain.    [provider]  meclizine (ANTIVERT) 25 MG tablet Take 1 tablet (25 mg total) by mouth 3 (three) times daily as needed for dizziness. Patient not taking: Reported on 04/29/2018 02/05/18   Orpah Greek, MD  ondansetron (ZOFRAN ODT) 4 MG disintegrating tablet Take 1 tablet (4 mg total) by mouth every 8 (eight) hours as needed for nausea or vomiting. 01/21/19   Lorin Glass, PA-C  polyethylene glycol [facility]) packet Take 17 g by mouth daily. Patient not taking: Reported on 10/04/2018 04/29/18   Delia Heady, PA-C  predniSONE (DELTASONE) 5 MG tablet Take 10 mg by mouth 3 (three) times daily.     [provider]    Family History Family  History  Problem Relation Age of Onset  . Heart failure Mother   . Alcohol abuse Father   . Crohn's disease Brother 59    Social History Social History   Tobacco Use  . Smoking status: Former Smoker    Packs/day: 1.00    Years: 12.00    Pack years: 12.00    Types: E-cigarettes    Quit date: 07/11/2014    Years since quitting: 5.2  . Smokeless tobacco: Never Used  Substance Use Topics  . Alcohol use: No  . Drug  use: No     Allergies   Patient has no known allergies.   Review of Systems Review of Systems Ten systems are reviewed and are negative for acute change except as noted in the HPI   Physical Exam Updated Vital Signs BP (!) 124/95   Pulse (!) 106   Temp 98.7 F (37.1 C) (Oral)   Resp 12   Ht 5\' 6"  (1.676 m)   Wt 77.1 kg   SpO2 100%   BMI 27.44 kg/m   Physical Exam Constitutional:      General: She is not in acute distress.    Appearance: Normal appearance. She is well-developed. She is not ill-appearing or diaphoretic.     Comments: Patient ambulating around room attempting to fix privacy curtain.  Well-appearing.  HENT:     Head: Normocephalic and atraumatic.     Right Ear: External ear normal.     Left Ear: External ear normal.     Nose: Nose normal.  Eyes:     General: Vision grossly intact. Gaze aligned appropriately.     Pupils: Pupils are equal, round, and reactive to light.  Neck:     Musculoskeletal: Normal range of motion.     Trachea: Trachea and phonation normal. No tracheal deviation.  Pulmonary:     Effort: Pulmonary effort is normal. No respiratory distress.  Abdominal:     General: There is no distension.     Palpations: Abdomen is soft.     Tenderness: There is no abdominal tenderness. There is no guarding or rebound.  Musculoskeletal: Normal range of motion.  Skin:    General: Skin is warm and dry.  Neurological:     Mental Status: She is alert.     GCS: GCS eye subscore is 4. GCS verbal subscore is 5. GCS motor subscore is 6.     Comments: Speech is clear and goal oriented, follows commands Major Cranial nerves without deficit, no facial droop Moves extremities without ataxia, coordination intact  Psychiatric:        Behavior: Behavior normal.      ED Treatments / Results  Labs (all labs ordered are listed, but only abnormal results are displayed) Labs Reviewed  CBC WITH DIFFERENTIAL/PLATELET - Abnormal; Notable for the following  components:      Result Value   RBC 3.58 (*)    HCT 35.8 (*)    MCH 34.9 (*)    All other components within normal limits  COMPREHENSIVE METABOLIC PANEL - Abnormal; Notable for the following components:   Glucose, Bld 101 (*)    Total Bilirubin 1.5 (*)    All other components within normal limits  I-STAT BETA HCG BLOOD, ED ([facility], [facility], [facility] ONLY)    EKG None  Radiology Ct Head Wo Contrast  Result Date: 10/07/2019 CLINICAL DATA:  Frontal headache. EXAM: CT HEAD WITHOUT CONTRAST TECHNIQUE: Contiguous axial images were obtained from the base of the skull through the  vertex without intravenous contrast. COMPARISON:  February 05, 2018 FINDINGS: Brain: No evidence of acute infarction, hemorrhage, hydrocephalus, extra-axial collection or mass lesion/mass effect. Vascular: No hyperdense vessel or unexpected calcification. Skull: Normal. Negative for fracture or focal lesion. Sinuses/Orbits: No acute finding. Other: None. IMPRESSION: No cause for headache or pain identified.  No acute abnormalities. Electronically Signed   By: Dorise Bullion III M.D   On: 10/07/2019 14:11    Procedures Procedures (including critical care time)  Medications Ordered in ED Medications  metoCLOPramide (REGLAN) injection 10 mg (10 mg Intravenous Given 10/07/19 1329)  diphenhydrAMINE (BENADRYL) injection 12.5 mg (12.5 mg Intravenous Given 10/07/19 1332)  sodium chloride 0.9 % bolus 500 mL (500 mLs Intravenous New Bag/Given 10/07/19 1313)     Initial Impression / Assessment and Plan / ED Course  I have reviewed the triage vital signs and the nursing notes.  Pertinent labs & imaging results that were available during my care of the patient were reviewed by me and considered in my medical decision making (see chart for details).    On initial evaluation patient well-appearing no acute distress ambulating around room.  Headache is improving prior to my evaluation.  No neuro deficits on examination.  Similar to  previous headaches but reports is more severe today.  No history of recent illness, fevers or neck pain/vision changes.  No pain to the temporal or carotid arteries.  No loss of consciousness.  Patient is followed by oncology and rheumatology at [facility], history of connective tissue disease and smoldering myeloma, she reports compliance with all medications.  Review of office visit from 06/20/2019 shows the patient had nonspecific brain lesions seen by neurology.  Not felt to be MS.  Will obtain basic blood work and CT head based on her history. - Beta-hCG negative CMP nonacute CBC nonacute CT Head:  IMPRESSION:  No cause for headache or pain identified. No acute abnormalities.  - Patient given fluid bolus, Reglan and Benadryl.  Reassessed reports complete resolution of her headache today and is requesting discharge.  On re-evaluation, patient well-appearing and in no acute distress. The patient denies any neurologic symptoms such as visual changes, focal numbness/weakness, balance problems, confusion, or speech difficulty to suggest a life-threatening intracranial process such as intracranial hemorrhage or mass. The patient has no clotting risk factors thus venous sinus thrombosis is unlikely. No fevers, neck pain or nuchal rigidity to suggest meningitis. Patient is afebrile, non-toxic and well appearing.  Reports similar to prior, not sudden onset today doubt SAH.  Reassuring neuro exam, normal gait around room and back. No cranial deficits, no speech deficits, negative pronator drift, normal/equal strength to all extremities.   PCP follow up strongly encouraged. I have reviewed return precautions including development of fever, nausea/vomiting or neurologic symptoms, vision changes, confusion, lethargy, difficulty speaking/walking, or other new/worsening/concerning symptoms. Patient states understanding of return precautions. Patient is agreeable to discharge.  At this time there does not appear to be  any evidence of an acute emergency medical condition and the patient appears stable for discharge with appropriate outpatient follow up. Diagnosis was discussed with patient who verbalizes understanding of care plan and is agreeable to discharge. I have discussed return precautions with patient who verbalizes understanding of return precautions. Patient encouraged to follow-up with their PCP. All questions answered.  Patient's case discussed with Dr. Wilson Singer who agrees with plan to discharge with follow-up.   Note: Portions of this report may have been transcribed using voice recognition software. Every effort was made  to ensure accuracy; however, inadvertent computerized transcription errors may still be present. Final Clinical Impressions(s) / ED Diagnoses   Final diagnoses:  Nonintractable headache, unspecified chronicity pattern, unspecified headache type    ED Discharge Orders    None       Gari Crown 10/07/19 1458    Virgel Manifold, MD 10/08/19 [phone]

## 2020-02-23 ENCOUNTER — Other Ambulatory Visit: Payer: Self-pay

## 2020-02-23 ENCOUNTER — Encounter (HOSPITAL_COMMUNITY): Payer: Self-pay

## 2020-02-23 ENCOUNTER — Emergency Department (HOSPITAL_COMMUNITY)
Admission: EM | Admit: 2020-02-23 | Discharge: 2020-02-23 | Disposition: A | Discharge status: Home / Self Care | Payer: Medicare Other | Attending: Emergency Medicine | Admitting: Emergency Medicine

## 2020-02-23 DIAGNOSIS — Z87891 Personal history of nicotine dependence: Secondary | ICD-10-CM | POA: Diagnosis not present

## 2020-02-23 DIAGNOSIS — R945 Abnormal results of liver function studies: Secondary | ICD-10-CM | POA: Insufficient documentation

## 2020-02-23 DIAGNOSIS — Z79899 Other long term (current) drug therapy: Secondary | ICD-10-CM | POA: Diagnosis not present

## 2020-02-23 DIAGNOSIS — R220 Localized swelling, mass and lump, head: Secondary | ICD-10-CM | POA: Diagnosis present

## 2020-02-23 DIAGNOSIS — R7989 Other specified abnormal findings of blood chemistry: Secondary | ICD-10-CM

## 2020-02-23 DIAGNOSIS — K047 Periapical abscess without sinus: Secondary | ICD-10-CM

## 2020-02-23 LAB — CBC WITH DIFFERENTIAL/PLATELET
Abs Immature Granulocytes: 0 10*3/uL (ref 0.00–0.07)
Basophils Absolute: 0 10*3/uL (ref 0.0–0.1)
Basophils Relative: 0 %
Eosinophils Absolute: 0 10*3/uL (ref 0.0–0.5)
Eosinophils Relative: 2 %
HCT: 34.2 % — ABNORMAL LOW (ref 36.0–46.0)
Hemoglobin: 12.3 g/dL (ref 12.0–15.0)
Immature Granulocytes: 0 %
Lymphocytes Relative: 27 %
Lymphs Abs: 0.7 10*3/uL (ref 0.7–4.0)
MCH: 34.7 pg — ABNORMAL HIGH (ref 26.0–34.0)
MCHC: 36 g/dL (ref 30.0–36.0)
MCV: 96.6 fL (ref 80.0–100.0)
Monocytes Absolute: 0.2 10*3/uL (ref 0.1–1.0)
Monocytes Relative: 7 %
Neutro Abs: 1.7 10*3/uL (ref 1.7–7.7)
Neutrophils Relative %: 64 %
Platelets: 156 10*3/uL (ref 150–400)
RBC: 3.54 MIL/uL — ABNORMAL LOW (ref 3.87–5.11)
RDW: 12.2 % (ref 11.5–15.5)
WBC: 2.6 10*3/uL — ABNORMAL LOW (ref 4.0–10.5)
nRBC: 0 % (ref 0.0–0.2)

## 2020-02-23 LAB — LIPASE, BLOOD: Lipase: 33 U/L (ref 11–51)

## 2020-02-23 LAB — URINALYSIS, ROUTINE W REFLEX MICROSCOPIC
Bacteria, UA: NONE SEEN
Bilirubin Urine: NEGATIVE
Glucose, UA: NEGATIVE mg/dL
Hgb urine dipstick: NEGATIVE
Ketones, ur: NEGATIVE mg/dL
Nitrite: NEGATIVE
Protein, ur: NEGATIVE mg/dL
Specific Gravity, Urine: 1.028 (ref 1.005–1.030)
pH: 5 (ref 5.0–8.0)

## 2020-02-23 LAB — COMPREHENSIVE METABOLIC PANEL
ALT: 258 U/L — ABNORMAL HIGH (ref 0–44)
AST: 196 U/L — ABNORMAL HIGH (ref 15–41)
Albumin: 3.9 g/dL (ref 3.5–5.0)
Alkaline Phosphatase: 75 U/L (ref 38–126)
Anion gap: 8 (ref 5–15)
BUN: 9 mg/dL (ref 6–20)
CO2: 27 mmol/L (ref 22–32)
Calcium: 9.3 mg/dL (ref 8.9–10.3)
Chloride: 103 mmol/L (ref 98–111)
Creatinine, Ser: 0.77 mg/dL (ref 0.44–1.00)
GFR calc Af Amer: >60 mL/min (ref >60)
GFR calc non Af Amer: >60 mL/min (ref >60)
Glucose, Bld: 98 mg/dL (ref 70–99)
Potassium: 3.7 mmol/L (ref 3.5–5.1)
Sodium: 138 mmol/L (ref 135–145)
Total Bilirubin: 1.5 mg/dL — ABNORMAL HIGH (ref 0.3–1.2)
Total Protein: 7.6 g/dL (ref 6.5–8.1)

## 2020-02-23 LAB — I-STAT BETA HCG BLOOD, ED (MC, WL, AP ONLY): I-stat hCG, quantitative: <5 m[IU]/mL (ref <5)

## 2020-02-23 MED ORDER — DIPHENHYDRAMINE HCL 25 MG PO CAPS
50.0000 mg | ORAL_CAPSULE | Freq: Once | ORAL | Status: AC
  Administered 2020-02-23: 50 mg via ORAL
  Filled 2020-02-23: qty 2

## 2020-02-23 MED ORDER — SODIUM CHLORIDE 0.9 % IV BOLUS
1000.0000 mL | Freq: Once | INTRAVENOUS | Status: AC
  Administered 2020-02-23: 1000 mL via INTRAVENOUS

## 2020-02-23 MED ORDER — MORPHINE SULFATE (PF) 4 MG/ML IV SOLN
4.0000 mg | Freq: Once | INTRAVENOUS | Status: AC
  Administered 2020-02-23: 4 mg via INTRAVENOUS
  Filled 2020-02-23: qty 1

## 2020-02-23 MED ORDER — ONDANSETRON HCL 4 MG/2ML IJ SOLN
4.0000 mg | Freq: Once | INTRAMUSCULAR | Status: AC
  Administered 2020-02-23: 4 mg via INTRAVENOUS
  Filled 2020-02-23: qty 2

## 2020-02-23 MED ORDER — KETOROLAC TROMETHAMINE 15 MG/ML IJ SOLN
15.0000 mg | Freq: Once | INTRAMUSCULAR | Status: AC
  Administered 2020-02-23: 15 mg via INTRAVENOUS
  Filled 2020-02-23: qty 1

## 2020-02-23 MED ORDER — PENICILLIN V POTASSIUM 500 MG PO TABS: 500.0000 mg | ORAL_TABLET | Freq: Four times a day (QID) | ORAL | 0 refills | Status: AC

## 2020-02-23 MED ORDER — METOCLOPRAMIDE HCL 5 MG/ML IJ SOLN
10.0000 mg | Freq: Once | INTRAMUSCULAR | Status: AC
  Administered 2020-02-23: 10 mg via INTRAVENOUS
  Filled 2020-02-23: qty 2

## 2020-02-23 NOTE — ED Triage Notes (Signed)
Pt presents with multiple complaints, Right lower jaw swelling x3 days, chronic fatigue, nausea, sore throat and dehydration x1 week.

## 2020-02-23 NOTE — Discharge Instructions (Addendum)
Take antibiotics as prescribed.  Take the entire course, even if your symptoms improve. Continue taking home medications as prescribed. Follow-up with a dentist for further evaluation and management of your teeth. Follow-up with your primary care doctor for recheck of your liver enzymes. Make sure you are staying well-hydrated water. Return to the emergency room with any new, worsening, concerning symptoms.

## 2020-02-23 NOTE — ED Provider Notes (Signed)
[facility] EMERGENCY DEPARTMENT Provider Note   CSN: [id number] Arrival date & time: 02/23/20  1138     History Chief Complaint  Patient presents with  . Nausea  . Fatigue    Kim Fitzgerald is a 44 y.o. female presenting for evaluation of nausea, right-sided facial swelling, sore throat.  Patient states that the past week, she has not been feeling well.  She reports nausea, which is constant.  No vomiting.  She has had gradually worsening right-sided facial swelling and pain, and a sore throat.  She states she has felt warm, but has not checked her temperature to know she has a fever.  She denies cough, chest pain, shortness of breath.  She denies abdominal pain or normal bowel movements.  She reports urinary frequency, no dysuria or hematuria.  She has not taken anything for her symptoms.  She is taking her home medications as prescribed, including her Plaquenil, methotrexate, Salagen, Dexilant.  She is no longer on prednisone.  HPI     Past Medical History:  Diagnosis Date  . GERD (gastroesophageal reflux disease)   . H. pylori infection     treated twice  . Lupus ([place])   . Ovarian failure   . S/P colonoscopy 2006   [place]-MD w/[place](PAC), 1 polyp-benign  . 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 Partial small bowel obstruction ([place]) 08/15/2017  . Undifferentiated connective tissue disease ([place]) 08/15/2017  . Sjogren's disease ([place]) 08/15/2017  . GERD (gastroesophageal reflux disease) 06/30/2011  . History of colonic polyps 06/30/2011    Past Surgical History:  Procedure Laterality Date  . APPENDECTOMY    . CHOLECYSTECTOMY     cholelithiasis  . DILATATION & CURRETTAGE/HYSTEROSCOPY WITH RESECTOCOPE N/A 08/15/2014   Procedure: DILATATION & CURETTAGE, HYSTEROSCOPY WITH myosure;  Surgeon: Marvene Staff, MD;  Location: [facility];  Service: Gynecology;  Laterality: N/A;  .  ESOPHAGOGASTRODUODENOSCOPY  07/16/2011   mild gastritis  . OVARIAN CYST REMOVAL  2010   laproscopic     OB History   No obstetric history on file.     Family History  Problem Relation Age of Onset  . Heart failure Mother   . Alcohol abuse Father   . Crohn's disease Brother 39    Social History   Tobacco Use  . Smoking status: Former Smoker    Packs/day: 1.00    Years: 12.00    Pack years: 12.00    Types: E-cigarettes    Quit date: 07/11/2014    Years since quitting: 5.6  . Smokeless tobacco: Never Used  Substance Use Topics  . Alcohol use: No  . Drug use: No    Home Medications Prior to Admission medications   Medication Sig Start Date End Date Taking? Authorizing Provider  buPROPion (WELLBUTRIN XL) 150 MG 24 hr tablet Take 150 mg by mouth daily. 07/17/18  Yes [provider]  citalopram (CELEXA) 10 MG tablet Take 10 mg by mouth daily. 11/08/19  Yes [provider]  dexlansoprazole (DEXILANT) 60 MG capsule Take 1 capsule (60 mg total) by mouth daily. 10/22/14  Yes Mahala Menghini, PA-C  gabapentin (NEURONTIN) 300 MG capsule Take 300 mg by mouth 3 (three) times daily.  06/24/17  Yes [provider]  hydroxychloroquine (PLAQUENIL) 200 MG tablet Take 200 mg by mouth 2 (two) times daily.   Yes [provider]  ibuprofen (ADVIL,MOTRIN) 200 MG tablet Take 400  mg by mouth every 6 (six) hours as needed for moderate pain.   Yes [provider]  methotrexate (RHEUMATREX) 2.5 MG tablet Take 20 mg by mouth once a week. 01/18/20  Yes [provider]  pilocarpine (SALAGEN) 5 MG tablet Take 5 mg by mouth in the morning, at noon, and at bedtime. 02/28/19 02/28/20 Yes [provider]  meclizine (ANTIVERT) 25 MG tablet Take 1 tablet (25 mg total) by mouth 3 (three) times daily as needed for dizziness. Patient not taking: Reported on 04/29/2018 02/05/18   Orpah Greek, MD  ondansetron (ZOFRAN ODT) 4 MG disintegrating tablet  Take 1 tablet (4 mg total) by mouth every 8 (eight) hours as needed for nausea or vomiting. 01/21/19   Lorin Glass, PA-C  penicillin v potassium (VEETID) 500 MG tablet Take 1 tablet (500 mg total) by mouth 4 (four) times daily for 7 days. 02/23/20 03/01/20  Lilliemae Fruge, PA-C  polyethylene glycol (MIRALAX) packet Take 17 g by mouth daily. Patient not taking: Reported on 10/04/2018 04/29/18   Delia Heady, PA-C    Allergies    Patient has no known allergies.  Review of Systems   Review of Systems  Constitutional: Positive for fatigue (chronic, baseline) and fever (subejctive).  HENT: Positive for facial swelling.   Gastrointestinal: Positive for nausea.  All other systems reviewed and are negative.   Physical Exam Updated Vital Signs BP (!) 124/98 Comment: room air  Pulse 99 Comment: room air  Temp 98.1 F (36.7 C) (Oral)   Resp 12 Comment: room air  SpO2 98% Comment: room air  Physical Exam Vitals and nursing note reviewed.  Constitutional:      General: She is not in acute distress.    Appearance: She is well-developed.     Comments: Resting comfortably in the bed in no acute distress  HENT:     Head: Normocephalic and atraumatic.     Mouth/Throat:      Comments: Overall poor dentition with multiple dental caries.  Tooth is missing.  Tenderness of the right lower tooth.  Mild right-sided facial swelling, does not extend under the jawline.  No pain under the tongue.  No trismus or difficulty handling secretions. Eyes:     Extraocular Movements: Extraocular movements intact.     Conjunctiva/sclera: Conjunctivae normal.     Pupils: Pupils are equal, round, and reactive to light.  Cardiovascular:     Rate and Rhythm: Normal rate and regular rhythm.     Pulses: Normal pulses.  Pulmonary:     Effort: Pulmonary effort is normal. No respiratory distress.     Breath sounds: Normal breath sounds. No wheezing.     Comments: Tenderness to palpation of the anterior chest  wall.  Speaking in full sentences.  Clear lung sounds in all fields. Chest:     Chest wall: Tenderness present.  Abdominal:     General: There is no distension.     Palpations: Abdomen is soft. There is no mass.     Tenderness: There is abdominal tenderness. There is no guarding or rebound.     Comments: Mild discomfort with palpation of upper abdomen.  No rigidity, guarding or distention.  Negative rebound.  No peritonitis.  Musculoskeletal:        General: Normal range of motion.     Cervical back: Normal range of motion and neck supple.     Right lower leg: No edema.     Left lower leg: No edema.  Skin:  General: Skin is warm and dry.     Capillary Refill: Capillary refill takes less than 2 seconds.  Neurological:     Mental Status: She is alert and oriented to person, place, and time.     ED Results / Procedures / Treatments   Labs (all labs ordered are listed, but only abnormal results are displayed) Labs Reviewed  CBC WITH DIFFERENTIAL/PLATELET - Abnormal; Notable for the following components:      Result Value   WBC 2.6 (*)    RBC 3.54 (*)    HCT 34.2 (*)    MCH 34.7 (*)    All other components within normal limits  COMPREHENSIVE METABOLIC PANEL - Abnormal; Notable for the following components:   AST 196 (*)    ALT 258 (*)    Total Bilirubin 1.5 (*)    All other components within normal limits  URINALYSIS, ROUTINE W REFLEX MICROSCOPIC - Abnormal; Notable for the following components:   Color, Urine AMBER (*)    Leukocytes,Ua SMALL (*)    All other components within normal limits  URINE CULTURE  LIPASE, BLOOD  I-STAT BETA HCG BLOOD, ED ([facility], [facility], [facility] ONLY)    EKG None  Radiology No results found.  Procedures Procedures (including critical care time)  Medications Ordered in ED Medications  sodium chloride 0.9 % bolus 1,000 mL (1,000 mLs Intravenous New Bag/Given 02/23/20 1243)  ondansetron (ZOFRAN) injection 4 mg (4 mg Intravenous Given 02/23/20 1243)    ketorolac (TORADOL) 15 MG/ML injection 15 mg (15 mg Intravenous Given 02/23/20 1243)  morphine 4 MG/ML injection 4 mg (4 mg Intravenous Given 02/23/20 1356)  metoCLOPramide (REGLAN) injection 10 mg (10 mg Intravenous Given 02/23/20 1354)  diphenhydrAMINE (BENADRYL) capsule 50 mg (50 mg Oral Given 02/23/20 1353)    ED Course  I have reviewed the triage vital signs and the nursing notes.  Pertinent labs & imaging results that were available during my care of the patient were reviewed by me and considered in my medical decision making (see chart for details).    MDM Rules/Calculators/A&P                      Patient presenting for evaluation of facial swelling, tooth pain, nausea, chronic fatigue.  On exam, patient appears nontoxic.  She does have right lower tooth pain and facial swelling, consistent with dental infection.  Considering her complex medical history, will obtain labs.  She reports urinary frequency, will check UA.  Will treat symptomatically with fluids, Toradol, and Zofran.  On reassessment, patient reports no improvement with medications and fluids.  Will give further pain and nausea control reassess.  Labs interpreted by me, overall reassuring.  Patient does have some mild leukopenia and elevated LFTs.  This could be due to pa possible viral illness.  Also consider lab abnormalities due to medications.  Patient does not appear septic, exam not consistent with acute gallbladder etiology.  Patient denies alcohol or Tylenol use.  Case discussed with attending, Dr. Eulis Foster agrees to plan. Will have patient follow-up for recheck of LFTs, as I expect they will get better with time.  Urine without infection.  Labs otherwise reassuring.  On reassessment, patient reports her symptoms are improved.  She feels much better.  Discussed importance of taking antibiotics for her dental infection.  Discussed importance of follow-up with dentistry and primary care.  At this time, patient appears safe for  discharge.  Return precautions given.  Patient states she understands and  agrees to plan.  Final Clinical Impression(s) / ED Diagnoses Final diagnoses:  Dental infection  Elevated LFTs    Rx / DC Orders ED Discharge Orders         Ordered    penicillin v potassium (VEETID) 500 MG tablet  4 times daily     02/23/20 1416           Herve Haug, PA-C 02/23/20 1427    Daleen Bo, MD 02/23/20 1830

## 2020-02-23 NOTE — ED Notes (Signed)
Pt's mother, Robbi Garter called. Requests return phone call, [phone]

## 2020-02-24 LAB — URINE CULTURE: Culture: <10000 — AB

## 2020-04-10 IMAGING — CT CT HEAD W/O CM
4 series · 16 of 47 positions shown, 18 images · non-contrast
Comparison: February 05, 2018

CLINICAL DATA: Frontal headache.

EXAM:
CT HEAD WITHOUT CONTRAST
TECHNIQUE: Contiguous axial images were obtained from the base of the skull
through the vertex without intravenous contrast.

[Series 3: head without · axial · non-contrast · 0.43mm/px · z∈[-127,-22]mm · 7 of 29 slices shown, 9 images]
[im 4/29  brain]
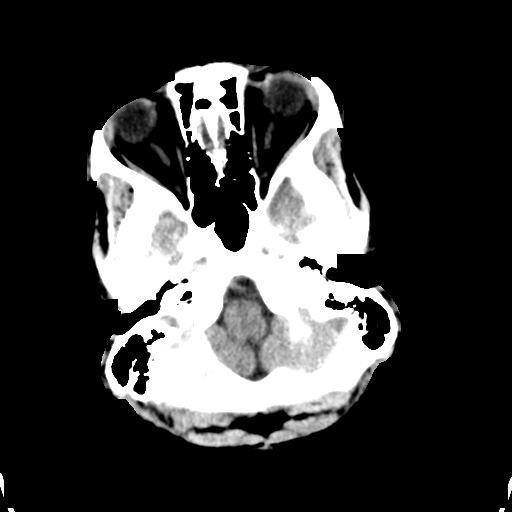
[im 4/29  bone]
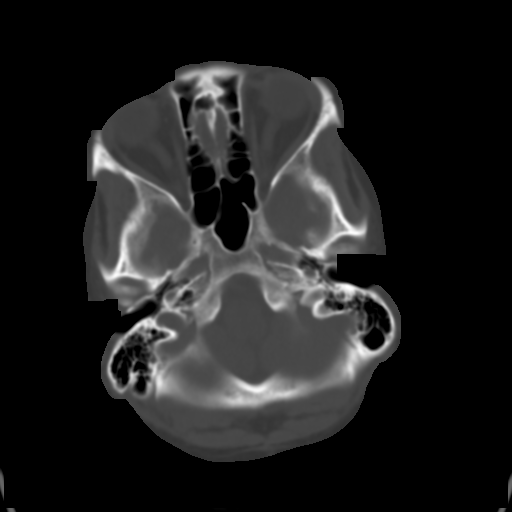
[im 8/29  brain]
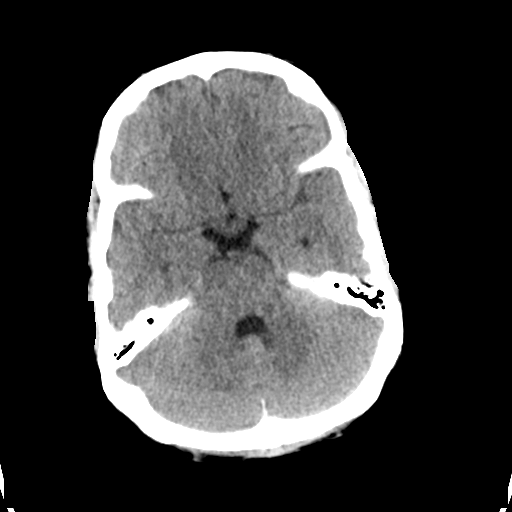
[im 11/29  brain]
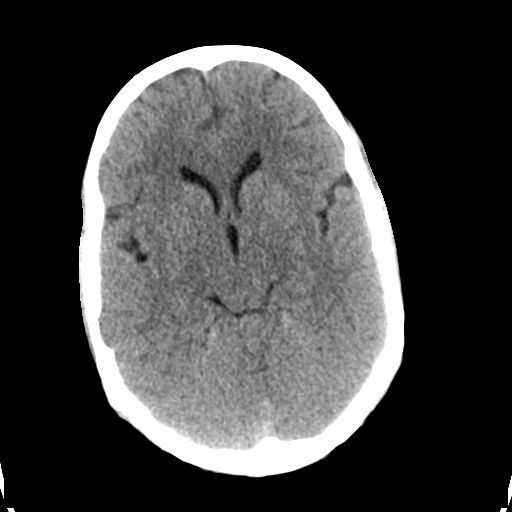
[im 15/29  brain]
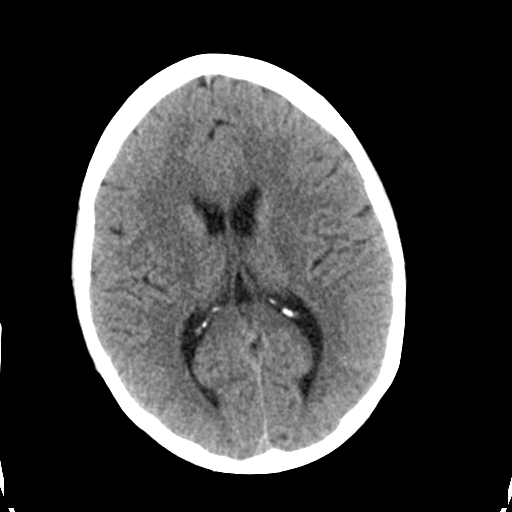
[im 18/29  brain]
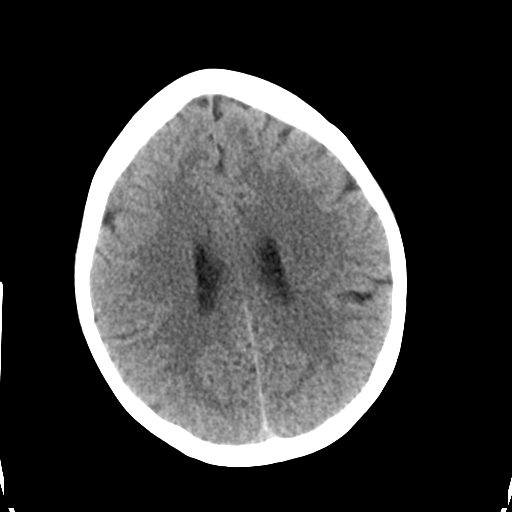
[im 18/29  bone]
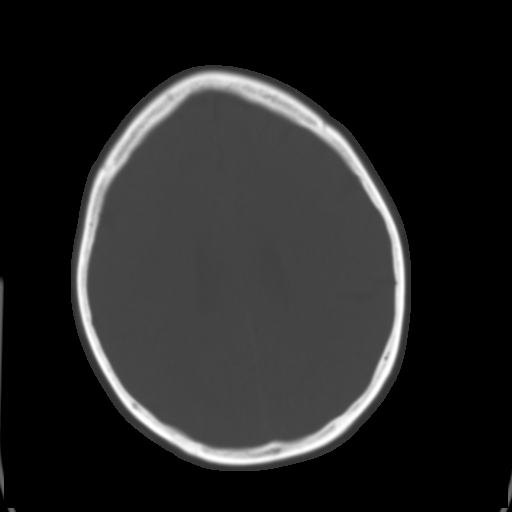
[im 22/29  brain]
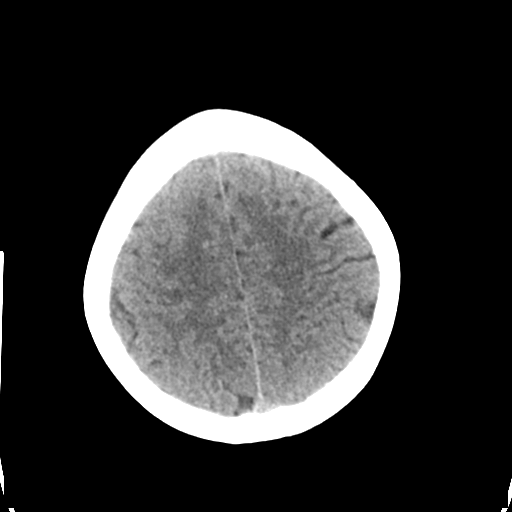
[im 25/29  brain]
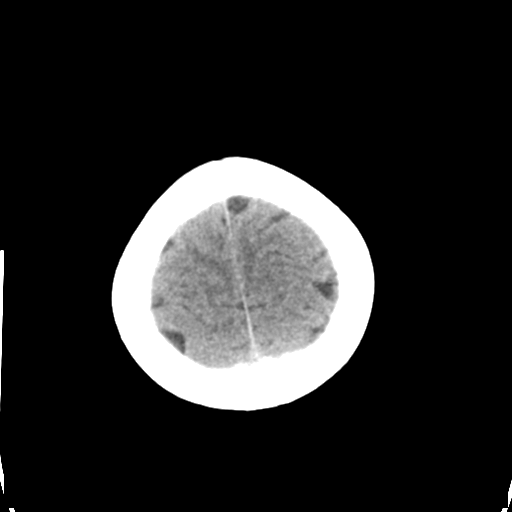

[Series 4: head bone · axial · 0.43mm/px · z∈[-128,-100]mm · 3 of 73 slices shown]
[im 8/73  bone]
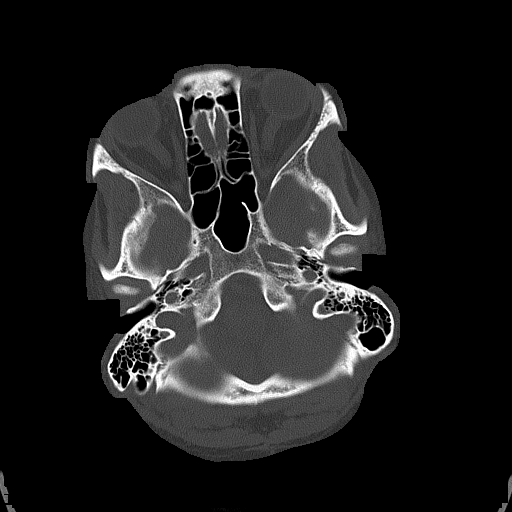
[im 15/73  bone]
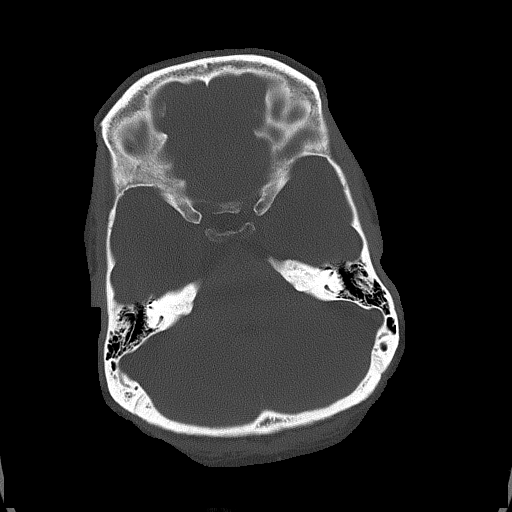
[im 22/73  bone]
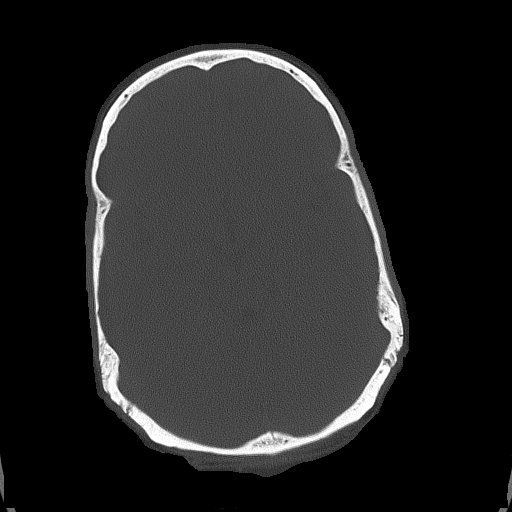

[Series 5: head without cor · coronal · non-contrast · 0.32mm/px · 3 of 65 slices shown]
[im 22/65  brain]
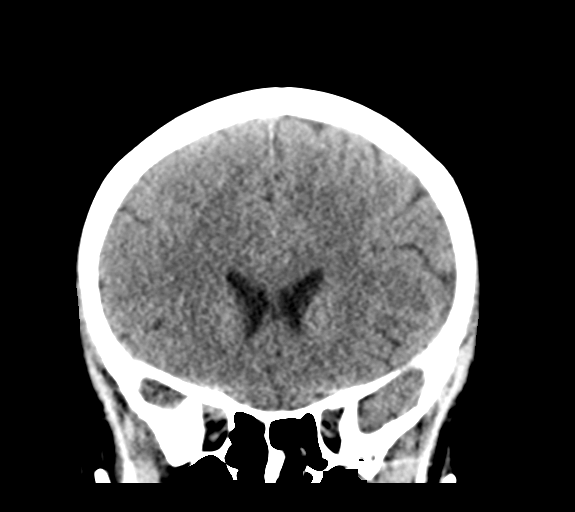
[im 29/65  brain]
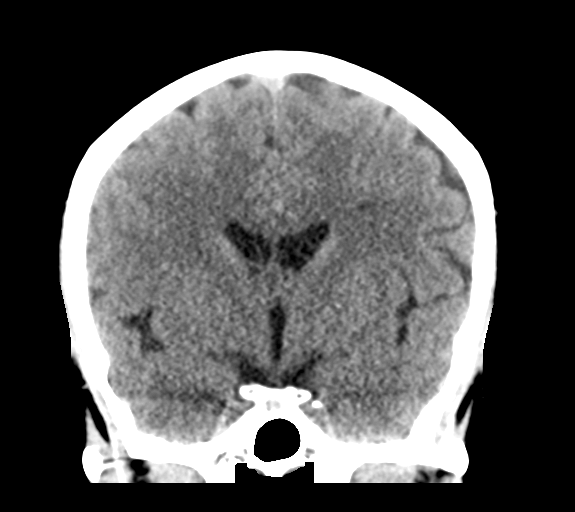
[im 36/65  brain]
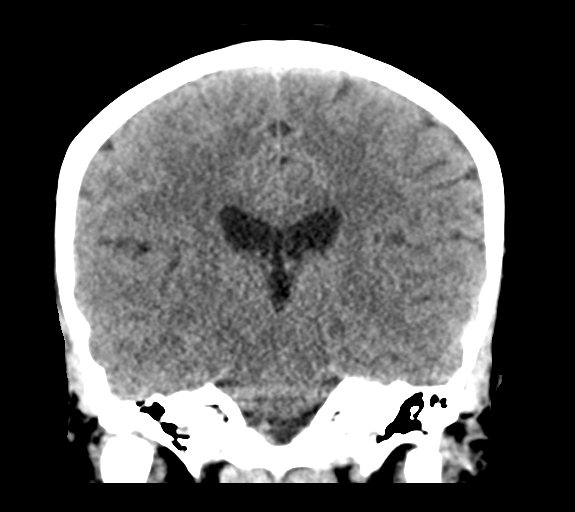

[Series 6: head without sag · sagittal · non-contrast · 0.30mm/px · 3 of 52 slices shown]
[im 18/52  brain]
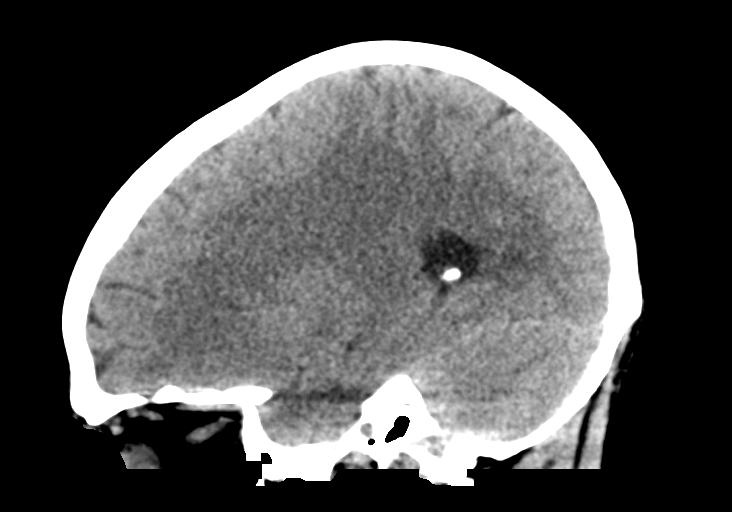
[im 26/52  brain]
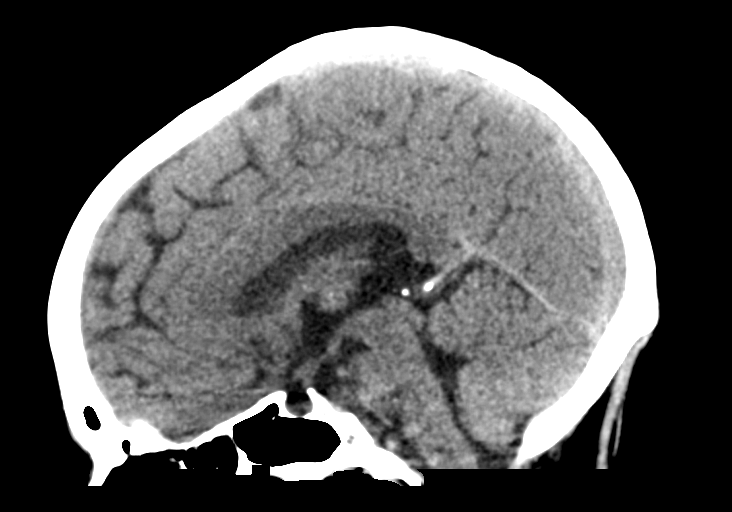
[im 35/52  brain]
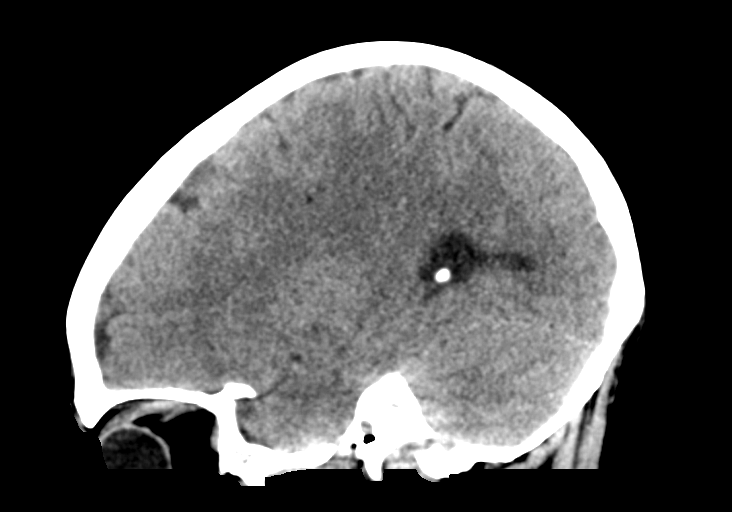

[16 of 47 positions shown; findings below may reference images not displayed]

FINDINGS: Brain: No evidence of acute infarction, hemorrhage, hydrocephalus,
extra-axial collection or mass lesion/mass effect.

Vascular: No hyperdense vessel or unexpected calcification.

Skull: Normal. Negative for fracture or focal lesion.

Sinuses/Orbits: No acute finding.

Other: None.
IMPRESSION: No cause for headache or pain identified.  No acute abnormalities.

## 2020-10-03 ENCOUNTER — Emergency Department (HOSPITAL_COMMUNITY)
Admission: EM | Admit: 2020-10-03 | Discharge: 2020-10-04 | Disposition: A | Discharge status: Home / Self Care | Payer: Medicare Other | Attending: Emergency Medicine | Admitting: Emergency Medicine

## 2020-10-03 ENCOUNTER — Other Ambulatory Visit: Payer: Self-pay

## 2020-10-03 ENCOUNTER — Encounter (HOSPITAL_COMMUNITY): Payer: Self-pay | Admitting: Emergency Medicine

## 2020-10-03 DIAGNOSIS — Z5321 Procedure and treatment not carried out due to patient leaving prior to being seen by health care provider: Secondary | ICD-10-CM | POA: Diagnosis not present

## 2020-10-03 DIAGNOSIS — R5383 Other fatigue: Secondary | ICD-10-CM | POA: Diagnosis not present

## 2020-10-03 DIAGNOSIS — R531 Weakness: Secondary | ICD-10-CM | POA: Insufficient documentation

## 2020-10-03 LAB — CBC WITH DIFFERENTIAL/PLATELET
Abs Immature Granulocytes: 0.01 10*3/uL (ref 0.00–0.07)
Basophils Absolute: 0 10*3/uL (ref 0.0–0.1)
Basophils Relative: 0 %
Eosinophils Absolute: 0 10*3/uL (ref 0.0–0.5)
Eosinophils Relative: 1 %
HCT: 34.3 % — ABNORMAL LOW (ref 36.0–46.0)
Hemoglobin: 11.5 g/dL — ABNORMAL LOW (ref 12.0–15.0)
Immature Granulocytes: 0 %
Lymphocytes Relative: 51 %
Lymphs Abs: 1.9 10*3/uL (ref 0.7–4.0)
MCH: 33.9 pg (ref 26.0–34.0)
MCHC: 33.5 g/dL (ref 30.0–36.0)
MCV: 101.2 fL — ABNORMAL HIGH (ref 80.0–100.0)
Monocytes Absolute: 0.2 10*3/uL (ref 0.1–1.0)
Monocytes Relative: 6 %
Neutro Abs: 1.6 10*3/uL — ABNORMAL LOW (ref 1.7–7.7)
Neutrophils Relative %: 42 %
Platelets: 188 10*3/uL (ref 150–400)
RBC: 3.39 MIL/uL — ABNORMAL LOW (ref 3.87–5.11)
RDW: 10.7 % — ABNORMAL LOW (ref 11.5–15.5)
WBC: 3.7 10*3/uL — ABNORMAL LOW (ref 4.0–10.5)
nRBC: 0 % (ref 0.0–0.2)

## 2020-10-03 LAB — URINALYSIS, ROUTINE W REFLEX MICROSCOPIC
Bacteria, UA: NONE SEEN
Bilirubin Urine: NEGATIVE
Glucose, UA: NEGATIVE mg/dL
Hgb urine dipstick: NEGATIVE
Ketones, ur: NEGATIVE mg/dL
Leukocytes,Ua: NEGATIVE
Nitrite: NEGATIVE
Protein, ur: 100 mg/dL — AB
Specific Gravity, Urine: 1.03 (ref 1.005–1.030)
pH: 5 (ref 5.0–8.0)

## 2020-10-03 LAB — COMPREHENSIVE METABOLIC PANEL
ALT: 14 U/L (ref 0–44)
AST: 19 U/L (ref 15–41)
Albumin: 4 g/dL (ref 3.5–5.0)
Alkaline Phosphatase: 58 U/L (ref 38–126)
Anion gap: 8 (ref 5–15)
BUN: 7 mg/dL (ref 6–20)
CO2: 28 mmol/L (ref 22–32)
Calcium: 9.8 mg/dL (ref 8.9–10.3)
Chloride: 102 mmol/L (ref 98–111)
Creatinine, Ser: 0.91 mg/dL (ref 0.44–1.00)
GFR, Estimated: >60 mL/min (ref >60)
Glucose, Bld: 92 mg/dL (ref 70–99)
Potassium: 3.5 mmol/L (ref 3.5–5.1)
Sodium: 138 mmol/L (ref 135–145)
Total Bilirubin: 0.9 mg/dL (ref 0.3–1.2)
Total Protein: 7.6 g/dL (ref 6.5–8.1)

## 2020-10-03 LAB — PREGNANCY, URINE: Preg Test, Ur: NEGATIVE

## 2020-10-03 NOTE — ED Notes (Signed)
No reply x3 for vitals 

## 2020-10-03 NOTE — ED Triage Notes (Signed)
Patient reports generalized weakness/fatigue for several days with nausea , no emesis or diarrhea , denies fever or chills .

## 2020-10-04 NOTE — ED Notes (Signed)
Pt called x 3  No answer. 

## 2021-07-28 ENCOUNTER — Other Ambulatory Visit: Payer: Self-pay | Admitting: Adult Health Nurse Practitioner

## 2021-09-29 ENCOUNTER — Other Ambulatory Visit: Payer: Self-pay | Admitting: Adult Health Nurse Practitioner

## 2022-03-16 ENCOUNTER — Other Ambulatory Visit: Payer: Self-pay | Admitting: Adult Health Nurse Practitioner

## 2022-07-19 ENCOUNTER — Encounter (HOSPITAL_COMMUNITY): Payer: Self-pay | Admitting: Emergency Medicine

## 2022-07-19 ENCOUNTER — Emergency Department (HOSPITAL_COMMUNITY): Payer: Medicare Other

## 2022-07-19 ENCOUNTER — Emergency Department (HOSPITAL_COMMUNITY)
Admission: EM | Admit: 2022-07-19 | Discharge: 2022-07-20 | Discharge status: Against Medical Advice | Payer: Medicare Other | Attending: Emergency Medicine | Admitting: Emergency Medicine

## 2022-07-19 ENCOUNTER — Other Ambulatory Visit: Payer: Self-pay

## 2022-07-19 DIAGNOSIS — Z5321 Procedure and treatment not carried out due to patient leaving prior to being seen by health care provider: Secondary | ICD-10-CM | POA: Diagnosis not present

## 2022-07-19 DIAGNOSIS — R110 Nausea: Secondary | ICD-10-CM | POA: Diagnosis not present

## 2022-07-19 DIAGNOSIS — R5381 Other malaise: Secondary | ICD-10-CM | POA: Insufficient documentation

## 2022-07-19 DIAGNOSIS — R531 Weakness: Secondary | ICD-10-CM | POA: Insufficient documentation

## 2022-07-19 DIAGNOSIS — R0602 Shortness of breath: Secondary | ICD-10-CM | POA: Insufficient documentation

## 2022-07-19 DIAGNOSIS — M549 Dorsalgia, unspecified: Secondary | ICD-10-CM | POA: Insufficient documentation

## 2022-07-19 LAB — BASIC METABOLIC PANEL
Anion gap: 4 — ABNORMAL LOW (ref 5–15)
BUN: 9 mg/dL (ref 6–20)
CO2: 30 mmol/L (ref 22–32)
Calcium: 9.3 mg/dL (ref 8.9–10.3)
Chloride: 105 mmol/L (ref 98–111)
Creatinine, Ser: 0.81 mg/dL (ref 0.44–1.00)
GFR, Estimated: >60 mL/min (ref >60)
Glucose, Bld: 93 mg/dL (ref 70–99)
Potassium: 3.8 mmol/L (ref 3.5–5.1)
Sodium: 139 mmol/L (ref 135–145)

## 2022-07-19 LAB — CBC
HCT: 32.8 % — ABNORMAL LOW (ref 36.0–46.0)
Hemoglobin: 11.3 g/dL — ABNORMAL LOW (ref 12.0–15.0)
MCH: 32.5 pg (ref 26.0–34.0)
MCHC: 34.5 g/dL (ref 30.0–36.0)
MCV: 94.3 fL (ref 80.0–100.0)
Platelets: 163 10*3/uL (ref 150–400)
RBC: 3.48 MIL/uL — ABNORMAL LOW (ref 3.87–5.11)
RDW: 10.7 % — ABNORMAL LOW (ref 11.5–15.5)
WBC: 3.5 10*3/uL — ABNORMAL LOW (ref 4.0–10.5)
nRBC: 0 % (ref 0.0–0.2)

## 2022-07-19 NOTE — ED Triage Notes (Signed)
Pt reported to ED with multiple complaints, including weakness x past three days and intermittent nausea. Pt states that she also had episode of severe back pain yesterday. Pt states she also feels that has been breathing abnormally but denies shortness of breath. Pt states she also has connective tissue disease. States that she needs her sedimentation rate checked.

## 2022-07-19 NOTE — ED Provider Triage Note (Signed)
Emergency Medicine Provider Triage Evaluation Note  Kim Fitzgerald , a 46 y.o. female  was evaluated in triage.  Pt complains of malaise, nausea, shortness of breath ongoing for a few days.  States she had transient back pain yesterday.  Currently without back pain.  Denies vomiting, fever, cough.  Review of Systems  Positive: As above Negative: As above  Physical Exam  BP 130/79 (BP Location: Right Arm)   Pulse 79   Temp 98.5 F (36.9 C) (Oral)   Resp 16   SpO2 100%  Gen:   Awake, no distress   Resp:  Normal effort  MSK:   Moves extremities without difficulty  Other:  Cervical, thoracic, lumbar spine without tenderness to palpation.  Abdomen soft and nondistended.  Medical Decision Making  Medically screening exam initiated at 8:34 PM.  Appropriate orders placed.  Kim Fitzgerald was informed that the remainder of the evaluation will be completed by another provider, this initial triage assessment does not replace that evaluation, and the importance of remaining in the ED until their evaluation is complete.     Kim Courier, PA-C 07/19/22 2035

## 2022-07-20 NOTE — ED Notes (Signed)
NAx4 for vitals

## 2022-07-24 ENCOUNTER — Emergency Department (HOSPITAL_COMMUNITY): Payer: Medicare Other

## 2022-07-24 ENCOUNTER — Emergency Department (HOSPITAL_COMMUNITY)
Admission: EM | Admit: 2022-07-24 | Discharge: 2022-07-24 | Disposition: A | Discharge status: Home / Self Care | Payer: Medicare Other | Attending: Emergency Medicine | Admitting: Emergency Medicine

## 2022-07-24 ENCOUNTER — Other Ambulatory Visit: Payer: Self-pay

## 2022-07-24 ENCOUNTER — Encounter (HOSPITAL_COMMUNITY): Payer: Self-pay | Admitting: Emergency Medicine

## 2022-07-24 DIAGNOSIS — M549 Dorsalgia, unspecified: Secondary | ICD-10-CM | POA: Insufficient documentation

## 2022-07-24 DIAGNOSIS — R791 Abnormal coagulation profile: Secondary | ICD-10-CM | POA: Insufficient documentation

## 2022-07-24 DIAGNOSIS — R5383 Other fatigue: Secondary | ICD-10-CM | POA: Insufficient documentation

## 2022-07-24 DIAGNOSIS — R0602 Shortness of breath: Secondary | ICD-10-CM | POA: Insufficient documentation

## 2022-07-24 DIAGNOSIS — R110 Nausea: Secondary | ICD-10-CM | POA: Diagnosis not present

## 2022-07-24 DIAGNOSIS — D72819 Decreased white blood cell count, unspecified: Secondary | ICD-10-CM | POA: Diagnosis not present

## 2022-07-24 DIAGNOSIS — R531 Weakness: Secondary | ICD-10-CM | POA: Insufficient documentation

## 2022-07-24 LAB — BASIC METABOLIC PANEL
Anion gap: 6 (ref 5–15)
BUN: 9 mg/dL (ref 6–20)
CO2: 29 mmol/L (ref 22–32)
Calcium: 9.7 mg/dL (ref 8.9–10.3)
Chloride: 102 mmol/L (ref 98–111)
Creatinine, Ser: 0.78 mg/dL (ref 0.44–1.00)
GFR, Estimated: >60 mL/min (ref >60)
Glucose, Bld: 100 mg/dL — ABNORMAL HIGH (ref 70–99)
Potassium: 4.1 mmol/L (ref 3.5–5.1)
Sodium: 137 mmol/L (ref 135–145)

## 2022-07-24 LAB — CBC WITH DIFFERENTIAL/PLATELET
Abs Immature Granulocytes: 0.01 10*3/uL (ref 0.00–0.07)
Basophils Absolute: 0 10*3/uL (ref 0.0–0.1)
Basophils Relative: 1 %
Eosinophils Absolute: 0.1 10*3/uL (ref 0.0–0.5)
Eosinophils Relative: 3 %
HCT: 35.1 % — ABNORMAL LOW (ref 36.0–46.0)
Hemoglobin: 12.3 g/dL (ref 12.0–15.0)
Immature Granulocytes: 0 %
Lymphocytes Relative: 47 %
Lymphs Abs: 1.5 10*3/uL (ref 0.7–4.0)
MCH: 32.9 pg (ref 26.0–34.0)
MCHC: 35 g/dL (ref 30.0–36.0)
MCV: 93.9 fL (ref 80.0–100.0)
Monocytes Absolute: 0.2 10*3/uL (ref 0.1–1.0)
Monocytes Relative: 6 %
Neutro Abs: 1.3 10*3/uL — ABNORMAL LOW (ref 1.7–7.7)
Neutrophils Relative %: 43 %
Platelets: 171 10*3/uL (ref 150–400)
RBC: 3.74 MIL/uL — ABNORMAL LOW (ref 3.87–5.11)
RDW: 11.1 % — ABNORMAL LOW (ref 11.5–15.5)
WBC: 3.1 10*3/uL — ABNORMAL LOW (ref 4.0–10.5)
nRBC: 0 % (ref 0.0–0.2)

## 2022-07-24 LAB — URINALYSIS, ROUTINE W REFLEX MICROSCOPIC
Bilirubin Urine: NEGATIVE
Glucose, UA: NEGATIVE mg/dL
Hgb urine dipstick: NEGATIVE
Ketones, ur: NEGATIVE mg/dL
Leukocytes,Ua: NEGATIVE
Nitrite: NEGATIVE
Protein, ur: NEGATIVE mg/dL
Specific Gravity, Urine: 1.01 (ref 1.005–1.030)
pH: 5 (ref 5.0–8.0)

## 2022-07-24 LAB — TROPONIN I (HIGH SENSITIVITY)
Troponin I (High Sensitivity): <2 ng/L (ref <18)
Troponin I (High Sensitivity): 2 ng/L (ref <18)

## 2022-07-24 LAB — CBG MONITORING, ED: Glucose-Capillary: 112 mg/dL — ABNORMAL HIGH (ref 70–99)

## 2022-07-24 LAB — D-DIMER, QUANTITATIVE: D-Dimer, Quant: 0.6 ug/mL-FEU — ABNORMAL HIGH (ref 0.00–0.50)

## 2022-07-24 MED ORDER — ONDANSETRON HCL 4 MG PO TABS: 4.0000 mg | ORAL_TABLET | Freq: Four times a day (QID) | ORAL | 0 refills | Status: DC

## 2022-07-24 MED ORDER — SODIUM CHLORIDE 0.9 % IV BOLUS
1000.0000 mL | Freq: Once | INTRAVENOUS | Status: AC
  Administered 2022-07-24: 1000 mL via INTRAVENOUS

## 2022-07-24 MED ORDER — ONDANSETRON 4 MG PO TBDP
4.0000 mg | ORAL_TABLET | Freq: Once | ORAL | Status: AC
  Administered 2022-07-24: 4 mg via ORAL
  Filled 2022-07-24: qty 1

## 2022-07-24 MED ORDER — IOHEXOL 350 MG/ML SOLN
100.0000 mL | Freq: Once | INTRAVENOUS | Status: AC | PRN
  Administered 2022-07-24: 80 mL via INTRAVENOUS

## 2022-07-24 NOTE — ED Provider Notes (Signed)
[name] Provider Note   CSN: [id number] Arrival date & time: 07/24/22  1348     History  Chief Complaint  Patient presents with   Weakness    Kim Fitzgerald is a 46 y.o. female with past medical history significant for acid reflux, connective tissue disorder, Sjogren's, recent evaluation for possible MGUS who presents with complaint of weakness, fatigue, nausea, back pain for around 1 week.  She denies fever, vomiting, diarrhea, abdominal pain.  Patient reports that she had a fall around 1 month ago.  Patient endorses some shortness of breath with speaking.  She also endorses shortness of breath with exertion.  She denies chest pain associated with the shortness of breath.  Patient reports that she is taking Plaquenil, and other medications for her Sjogren's and connective tissue disorder.  She expressed some concern due to low blood counts at last lab work performed by her rheumatologist at [facility].  Previous history of DVT, PE, recent travel or leg pain or swelling.   Weakness Associated symptoms: nausea        Home Medications Prior to Admission medications   Medication Sig Start Date End Date Taking? Authorizing Provider  buPROPion (WELLBUTRIN XL) 150 MG 24 hr tablet Take 150 mg by mouth daily. 07/17/18  Yes [provider]  citalopram (CELEXA) 10 MG tablet Take 10 mg by mouth daily. 11/08/19  Yes [provider]  dexlansoprazole (DEXILANT) 60 MG capsule Take 1 capsule (60 mg total) by mouth daily. 10/22/14  Yes Mahala Menghini, PA-C  gabapentin (NEURONTIN) 300 MG capsule Take 300 mg by mouth 3 (three) times daily.  06/24/17  Yes [provider]  HYDROcodone-acetaminophen (NORCO/VICODIN) 5-325 MG tablet Take 1 tablet by mouth 2 (two) times daily as needed for pain. 07/24/22  Yes [provider]  hydroxychloroquine (PLAQUENIL) 200 MG tablet Take 200 mg by mouth 2 (two) times daily.   Yes [provider]   ibuprofen (ADVIL,MOTRIN) 200 MG tablet Take 400 mg by mouth every 6 (six) hours as needed for moderate pain.   Yes [provider]  methotrexate (RHEUMATREX) 2.5 MG tablet Take 20 mg by mouth once a week. Pt states this medication is currently on hold 01/18/20  Yes [provider]  ondansetron (ZOFRAN ODT) 4 MG disintegrating tablet Take 1 tablet (4 mg total) by mouth every 8 (eight) hours as needed for nausea or vomiting. 01/21/19  Yes Lorin Glass, PA-C  ondansetron (ZOFRAN) 4 MG tablet Take 1 tablet (4 mg total) by mouth every 6 (six) hours. 07/24/22  Yes Kylan Veach H, PA-C  zolpidem (AMBIEN) 10 MG tablet Take 10 mg by mouth at bedtime. 05/27/21  Yes [provider]  meclizine (ANTIVERT) 25 MG tablet Take 1 tablet (25 mg total) by mouth 3 (three) times daily as needed for dizziness. Patient not taking: Reported on 07/24/2022 02/05/18   Orpah Greek, MD  polyethylene glycol [facility]) packet Take 17 g by mouth daily. Patient not taking: Reported on 10/04/2018 04/29/18   Delia Heady, PA-C      Allergies    Patient has no known allergies.    Review of Systems   Review of Systems  Gastrointestinal:  Positive for nausea.  Neurological:  Positive for weakness.  All other systems reviewed and are negative.   Physical Exam Updated Vital Signs BP 115/67   Pulse 80   Temp 98.6 F (37 C)   Resp 17   Ht '5\' 6"'$  (1.676 m)  Wt 72.1 kg   SpO2 100%   BMI 25.66 kg/m  Physical Exam Vitals and nursing note reviewed.  Constitutional:      General: She is not in acute distress.    Appearance: Normal appearance.     Comments: Chronically ill-appearing  HENT:     Head: Normocephalic and atraumatic.  Eyes:     General:        Right eye: No discharge.        Left eye: No discharge.  Cardiovascular:     Rate and Rhythm: Normal rate and regular rhythm.     Heart sounds: No murmur heard.    No friction rub. No gallop.  Pulmonary:     Effort:  Pulmonary effort is normal.     Breath sounds: Normal breath sounds.  Abdominal:     General: Bowel sounds are normal.     Palpations: Abdomen is soft.  Skin:    General: Skin is warm and dry.     Capillary Refill: Capillary refill takes less than 2 seconds.  Neurological:     Mental Status: She is alert and oriented to person, place, and time.  Psychiatric:        Mood and Affect: Mood normal.        Behavior: Behavior normal.     ED Results / Procedures / Treatments   Labs (all labs ordered are listed, but only abnormal results are displayed) Labs Reviewed  BASIC METABOLIC PANEL - Abnormal; Notable for the following components:      Result Value   Glucose, Bld 100 (*)    All other components within normal limits  CBC WITH DIFFERENTIAL/PLATELET - Abnormal; Notable for the following components:   WBC 3.1 (*)    RBC 3.74 (*)    HCT 35.1 (*)    RDW 11.1 (*)    Neutro Abs 1.3 (*)    All other components within normal limits  D-DIMER, QUANTITATIVE - Abnormal; Notable for the following components:   D-Dimer, Quant 0.60 (*)    All other components within normal limits  CBG MONITORING, ED - Abnormal; Notable for the following components:   Glucose-Capillary 112 (*)    All other components within normal limits  URINALYSIS, ROUTINE W REFLEX MICROSCOPIC  TROPONIN I (HIGH SENSITIVITY)  TROPONIN I (HIGH SENSITIVITY)    EKG None  Radiology CT Angio Chest PE W and/or Wo Contrast  Result Date: 07/24/2022 CLINICAL DATA:  Pulmonary embolism (PE) suspected, positive D-dimer EXAM: CT ANGIOGRAPHY CHEST WITH CONTRAST TECHNIQUE: Multidetector CT imaging of the chest was performed using the standard protocol during bolus administration of intravenous contrast. Multiplanar CT image reconstructions and MIPs were obtained to evaluate the vascular anatomy. RADIATION DOSE REDUCTION: This exam was performed according to the departmental dose-optimization program which includes automated exposure  control, adjustment of the mA and/or kV according to patient size and/or use of iterative reconstruction technique. CONTRAST:  24m OMNIPAQUE IOHEXOL 350 MG/ML SOLN COMPARISON:  10/04/2018 FINDINGS: Cardiovascular: No filling defects in the pulmonary arteries to suggest pulmonary emboli. Heart is normal size. Aorta is normal caliber. Mediastinum/Nodes: No mediastinal, hilar, or axillary adenopathy. Trachea and esophagus are unremarkable. Thyroid unremarkable. Lungs/Pleura: Lungs are clear. No focal airspace opacities or suspicious nodules. No effusions. Upper Abdomen: No acute findings Musculoskeletal: Chest wall soft tissues are unremarkable. No acute bony abnormality. Review of the MIP images confirms the above findings. IMPRESSION: No evidence of pulmonary embolus. No acute cardiopulmonary disease. Electronically Signed   By: KLennette Bihari  Dover M.D.   On: 07/24/2022 19:07   DG Chest 2 View  Result Date: 07/24/2022 CLINICAL DATA:  Short of breath, weakness, fatigue and nausea for 1 week. EXAM: CHEST - 2 VIEW COMPARISON:  07/19/2022. FINDINGS: The heart size and mediastinal contours are within normal limits. Both lungs are clear. No pleural effusion or pneumothorax. The visualized skeletal structures are unremarkable. IMPRESSION: Normal chest radiographs. Electronically Signed   By: Lajean Manes M.D.   On: 07/24/2022 15:51    Procedures Procedures    Medications Ordered in ED Medications  ondansetron (ZOFRAN-ODT) disintegrating tablet 4 mg (4 mg Oral Given 07/24/22 1438)  sodium chloride 0.9 % bolus 1,000 mL (0 mLs Intravenous Stopped 07/24/22 1904)  iohexol (OMNIPAQUE) 350 MG/ML injection 100 mL (80 mLs Intravenous Contrast Given 07/24/22 1856)    ED Course/ Medical Decision Making/ A&P                            Medical Decision Making Amount and/or Complexity of Data Reviewed Labs: ordered. Decision-making details documented in ED Course. Radiology: ordered.  Risk Prescription drug  management.   This patient is a 46 y.o. female who presents to the ED for concern of fatigue, weakness, nausea, shortness of breath, this involves an extensive number of treatment options, and is a complaint that carries with it a high risk of complications and morbidity. The emergent differential diagnosis prior to evaluation includes, but is not limited to, electrolyte abnormality, manifestation of Sjogren's, PE, pneumonia, acute bronchitis, asthma or COPD exacerbation, chronic disease, intra-abdominal concern including acid reflux, gastroenteritis, gastritis or other.   This is not an exhaustive differential.   Past Medical History / Co-morbidities / Social History: acid reflux, connective tissue disorder, Sjogren's, recent evaluation for possible MGUS   Additional history: Chart reviewed. Pertinent results include: Reviewed notes from due to evaluation for MGUS, they thought that it was likely autoimmune in nature, reviewed previous lab work, imaging  Physical Exam: Physical exam performed. The pertinent findings include: Patient with no focal physical exam findings, no significant tenderness palpation of abdomen.  She does have appearance of chronic illness.  Lab Tests: I ordered, and personally interpreted labs.  The pertinent results include: CBC with mild leukopenia which is similar compared to baseline, no clinically significant anemia or platelet dysfunction.  Troponin negative x1 in context of no active chest pain.  Urinalysis unremarkable.  BMP reassuring, unremarkable.  D-dimer minimally elevated at 0.60.  We will obtain PE study.  Her tachycardia did resolve after fluid bolus.   Imaging Studies: I ordered imaging studies including CT angio chest PE with and without contrast as well as chest x-ray. I independently visualized and interpreted imaging which showed no active intrathoracic disease, no evidence of pulmonary embolism. I agree with the radiologist interpretation.   Cardiac  Monitoring:  The patient was maintained on a cardiac monitor.  My attending physician Dr. Roslynn Amble viewed and interpreted the cardiac monitored which showed an underlying rhythm of: Sinus tachycardia, improved to sinus rhythm. I agree with this interpretation.   Medications: I ordered medication including fluid bolus, Zofran for dehydration, nausea. Reevaluation of the patient after these medicines showed that the patient improved. I have reviewed the patients home medicines and have made adjustments as needed.   Disposition: After consideration of the diagnostic results and the patients response to treatment, I feel that it is unclear what is causing patient's symptoms at this time, she complains of some  general malaise, fatigue, think that there are some components likely related to her autoimmune diseases other chronic pain syndromes, no acute findings noted in the emergency department today.  We will prescribe nausea medicine to help with appetite and encouraged follow-up with her PCP and rheumatologist.   emergency department workup does not suggest an emergent condition requiring admission or immediate intervention beyond what has been performed at this time. The plan is: As above. The patient is safe for discharge and has been instructed to return immediately for worsening symptoms, change in symptoms or any other concerns.  I discussed this case with my attending physician Dr. Roslynn Amble who cosigned this note including patient's presenting symptoms, physical exam, and planned diagnostics and interventions. Attending physician stated agreement with plan or made changes to plan which were implemented.    Final Clinical Impression(s) / ED Diagnoses Final diagnoses:  Generalized weakness  Nausea    Rx / DC Orders ED Discharge Orders          Ordered    ondansetron (ZOFRAN) 4 MG tablet  Every 6 hours        07/24/22 1934              Dorien Chihuahua 07/24/22 1952     Godfrey Pick, MD 07/26/22 [phone]

## 2022-07-24 NOTE — Discharge Instructions (Addendum)
Discussed is unclear what is causing your chronic fatigue, however your emergency department work-up today was reassuring.  I recommend that you follow-up with your primary care doctor as well as rheumatologist regarding your symptoms.  You can take the nausea medication I provided to help with your appetite and nausea in the meantime.  Please return to the emergency department if you have worsening shortness of breath, chest pain, or new changes to your general malaise and weakness.

## 2022-07-24 NOTE — ED Triage Notes (Signed)
Patient reports multiple complaints including weakness, fatigue, nausea and back pain x1 week. Denies fever, vomiting or diarrhea. Denies sick contacts. She reports having a fall approximately 1 month ago.

## 2022-08-24 ENCOUNTER — Encounter (HOSPITAL_COMMUNITY): Payer: Self-pay

## 2022-08-24 ENCOUNTER — Emergency Department (HOSPITAL_COMMUNITY)
Admission: EM | Admit: 2022-08-24 | Discharge: 2022-08-25 | Disposition: A | Discharge status: Home / Self Care | Payer: Medicare Other | Source: Home / Self Care | Attending: Emergency Medicine | Admitting: Emergency Medicine

## 2022-08-24 ENCOUNTER — Other Ambulatory Visit: Payer: Self-pay

## 2022-08-24 ENCOUNTER — Emergency Department (HOSPITAL_COMMUNITY): Payer: Medicare Other

## 2022-08-24 DIAGNOSIS — R1012 Left upper quadrant pain: Secondary | ICD-10-CM | POA: Insufficient documentation

## 2022-08-24 DIAGNOSIS — Z79899 Other long term (current) drug therapy: Secondary | ICD-10-CM | POA: Insufficient documentation

## 2022-08-24 DIAGNOSIS — R112 Nausea with vomiting, unspecified: Secondary | ICD-10-CM | POA: Insufficient documentation

## 2022-08-24 DIAGNOSIS — K2970 Gastritis, unspecified, without bleeding: Secondary | ICD-10-CM

## 2022-08-24 DIAGNOSIS — K2210 Ulcer of esophagus without bleeding: Secondary | ICD-10-CM | POA: Diagnosis not present

## 2022-08-24 DIAGNOSIS — R1110 Vomiting, unspecified: Secondary | ICD-10-CM | POA: Diagnosis not present

## 2022-08-24 LAB — CBC WITH DIFFERENTIAL/PLATELET
Abs Immature Granulocytes: 0.01 10*3/uL (ref 0.00–0.07)
Basophils Absolute: 0 10*3/uL (ref 0.0–0.1)
Basophils Relative: 0 %
Eosinophils Absolute: 0 10*3/uL (ref 0.0–0.5)
Eosinophils Relative: 0 %
HCT: 37.1 % (ref 36.0–46.0)
Hemoglobin: 12.9 g/dL (ref 12.0–15.0)
Immature Granulocytes: 0 %
Lymphocytes Relative: 39 %
Lymphs Abs: 1.8 10*3/uL (ref 0.7–4.0)
MCH: 33.8 pg (ref 26.0–34.0)
MCHC: 34.8 g/dL (ref 30.0–36.0)
MCV: 97.1 fL (ref 80.0–100.0)
Monocytes Absolute: 0.2 10*3/uL (ref 0.1–1.0)
Monocytes Relative: 4 %
Neutro Abs: 2.7 10*3/uL (ref 1.7–7.7)
Neutrophils Relative %: 57 %
Platelets: 224 10*3/uL (ref 150–400)
RBC: 3.82 MIL/uL — ABNORMAL LOW (ref 3.87–5.11)
RDW: 11.7 % (ref 11.5–15.5)
WBC: 4.7 10*3/uL (ref 4.0–10.5)
nRBC: 0 % (ref 0.0–0.2)

## 2022-08-24 LAB — URINALYSIS, ROUTINE W REFLEX MICROSCOPIC
Bilirubin Urine: NEGATIVE
Glucose, UA: NEGATIVE mg/dL
Hgb urine dipstick: NEGATIVE
Ketones, ur: NEGATIVE mg/dL
Leukocytes,Ua: NEGATIVE
Nitrite: NEGATIVE
Protein, ur: NEGATIVE mg/dL
Specific Gravity, Urine: 1.006 (ref 1.005–1.030)
pH: 7 (ref 5.0–8.0)

## 2022-08-24 MED ORDER — IOHEXOL 300 MG/ML  SOLN
100.0000 mL | Freq: Once | INTRAMUSCULAR | Status: AC | PRN
  Administered 2022-08-24: 100 mL via INTRAVENOUS

## 2022-08-24 NOTE — ED Provider Triage Note (Signed)
Emergency Medicine Provider Triage Evaluation Note  Kim Fitzgerald , a 46 y.o. female  was evaluated in triage.  Pt complains of nausea and vomiting associated with upper abdominal pain x3 days.  History of bowel obstruction.  Denies chronic alcohol use.  No chronic NSAIDs.  Previous cholecystectomy and appendectomy.  Review of Systems  Positive: Abdominal pain Negative: CP  Physical Exam  BP (!) 127/92   Pulse (!) 112   Temp 98.1 F (36.7 C) (Oral)   Resp 13   Ht '5\' 6"'$  (1.676 m)   Wt 72 kg   LMP  (LMP Unknown) Comment: ovarian failure  SpO2 100%   BMI 25.62 kg/m  Gen:   Awake, no distress   Resp:  Normal effort  MSK:   Moves extremities without difficulty  Other:    Medical Decision Making  Medically screening exam initiated at 10:26 PM.  Appropriate orders placed.  Kim Fitzgerald was informed that the remainder of the evaluation will be completed by another provider, this initial triage assessment does not replace that evaluation, and the importance of remaining in the ED until their evaluation is complete.  Abdominal labs CT abdomen to rule out obstruction   Kim Bouchard, PA-C 08/24/22 2227

## 2022-08-24 NOTE — ED Notes (Signed)
Pt needs IV for CT  

## 2022-08-24 NOTE — ED Triage Notes (Signed)
Pt reports n/v/burning sensation epigastric/ weakness x2 days.  Hx GERD Pt reports phenergan with no relief.

## 2022-08-25 ENCOUNTER — Emergency Department (HOSPITAL_COMMUNITY): Payer: Medicare Other

## 2022-08-25 LAB — COMPREHENSIVE METABOLIC PANEL
ALT: 31 U/L (ref 0–44)
AST: 33 U/L (ref 15–41)
Albumin: 4.4 g/dL (ref 3.5–5.0)
Alkaline Phosphatase: 59 U/L (ref 38–126)
Anion gap: 8 (ref 5–15)
BUN: 14 mg/dL (ref 6–20)
CO2: 26 mmol/L (ref 22–32)
Calcium: 9.4 mg/dL (ref 8.9–10.3)
Chloride: 102 mmol/L (ref 98–111)
Creatinine, Ser: 0.74 mg/dL (ref 0.44–1.00)
GFR, Estimated: >60 mL/min (ref >60)
Glucose, Bld: 94 mg/dL (ref 70–99)
Potassium: 3.6 mmol/L (ref 3.5–5.1)
Sodium: 136 mmol/L (ref 135–145)
Total Bilirubin: 1.7 mg/dL — ABNORMAL HIGH (ref 0.3–1.2)
Total Protein: 9 g/dL — ABNORMAL HIGH (ref 6.5–8.1)

## 2022-08-25 LAB — LIPASE, BLOOD: Lipase: 42 U/L (ref 11–51)

## 2022-08-25 MED ORDER — ONDANSETRON 4 MG PO TBDP: 4.0000 mg | ORAL_TABLET | Freq: Three times a day (TID) | ORAL | 0 refills | Status: DC | PRN

## 2022-08-25 MED ORDER — LACTATED RINGERS IV BOLUS
1000.0000 mL | Freq: Once | INTRAVENOUS | Status: AC
  Administered 2022-08-25: 1000 mL via INTRAVENOUS

## 2022-08-25 MED ORDER — DICYCLOMINE HCL 10 MG/5ML PO SOLN
10.0000 mg | Freq: Once | ORAL | Status: AC
  Administered 2022-08-25: 10 mg via ORAL
  Filled 2022-08-25: qty 5

## 2022-08-25 MED ORDER — FENTANYL CITRATE PF 50 MCG/ML IJ SOSY
25.0000 ug | PREFILLED_SYRINGE | Freq: Once | INTRAMUSCULAR | Status: AC
  Administered 2022-08-25: 25 ug via INTRAVENOUS
  Filled 2022-08-25: qty 1

## 2022-08-25 MED ORDER — ALUM & MAG HYDROXIDE-SIMETH 200-200-20 MG/5ML PO SUSP
30.0000 mL | Freq: Once | ORAL | Status: AC
  Administered 2022-08-25: 30 mL via ORAL
  Filled 2022-08-25: qty 30

## 2022-08-25 MED ORDER — ONDANSETRON HCL 4 MG/2ML IJ SOLN
4.0000 mg | Freq: Once | INTRAMUSCULAR | Status: AC
  Administered 2022-08-25: 4 mg via INTRAVENOUS
  Filled 2022-08-25: qty 2

## 2022-08-25 MED ORDER — OMEPRAZOLE 40 MG PO CPDR: 40.0000 mg | DELAYED_RELEASE_CAPSULE | Freq: Every day | ORAL | 0 refills | Status: DC

## 2022-08-25 MED ORDER — PANTOPRAZOLE SODIUM 40 MG IV SOLR
40.0000 mg | Freq: Once | INTRAVENOUS | Status: AC
  Administered 2022-08-25: 40 mg via INTRAVENOUS
  Filled 2022-08-25: qty 10

## 2022-08-25 MED ORDER — SUCRALFATE 1 GM/10ML PO SUSP
1.0000 g | Freq: Three times a day (TID) | ORAL | 0 refills | Status: DC | PRN
Start: 2022-08-25 — End: 2022-08-28

## 2022-08-25 NOTE — ED Notes (Signed)
Patient given copy of discharge instructions and given teaching on the instructions. Time was allotted for any questions the patient may have. Patient stated understanding. Patient ambulated out of the ED with a steady gate.

## 2022-08-25 NOTE — ED Provider Notes (Signed)
[facility] DEPT Provider Note   CSN: [id number] Arrival date & time: 08/24/22  2158     History  Chief Complaint  Patient presents with   Emesis    Kim Fitzgerald is a 46 y.o. female presents with exacerbation of her GERD, reflux symptoms with nausea vomiting with NBNB emesis and no diarrhea.  No fevers or chills.  Phenergan at home with minimal improvement.  On Dexilant  I personally read this patient's medical records.  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  HPI     Home Medications Prior to Admission medications   Medication Sig Start Date End Date Taking? Authorizing Provider  buPROPion (WELLBUTRIN XL) 150 MG 24 hr tablet Take 150 mg by mouth daily. 07/17/18   [provider]  citalopram (CELEXA) 10 MG tablet Take 10 mg by mouth daily. 11/08/19   [provider]  dexlansoprazole (DEXILANT) 60 MG capsule Take 1 capsule (60 mg total) by mouth daily. 10/22/14   Mahala Menghini, PA-C  gabapentin (NEURONTIN) 300 MG capsule Take 300 mg by mouth 3 (three) times daily.  06/24/17   [provider]  HYDROcodone-acetaminophen (NORCO/VICODIN) 5-325 MG tablet Take 1 tablet by mouth 2 (two) times daily as needed for pain. 07/24/22   [provider]  hydroxychloroquine (PLAQUENIL) 200 MG tablet Take 200 mg by mouth 2 (two) times daily.    [provider]  ibuprofen (ADVIL,MOTRIN) 200 MG tablet Take 400 mg by mouth every 6 (six) hours as needed for moderate pain.    [provider]  meclizine (ANTIVERT) 25 MG tablet Take 1 tablet (25 mg total) by mouth 3 (three) times daily as needed for dizziness. Patient not taking: Reported on 07/24/2022 02/05/18   Orpah Greek, MD  methotrexate (RHEUMATREX) 2.5 MG tablet Take 20 mg by mouth once a week. Pt states this medication is currently on hold 01/18/20   [provider]   ondansetron (ZOFRAN ODT) 4 MG disintegrating tablet Take 1 tablet (4 mg total) by mouth every 8 (eight) hours as needed for nausea or vomiting. 01/21/19   Lorin Glass, PA-C  ondansetron (ZOFRAN) 4 MG tablet Take 1 tablet (4 mg total) by mouth every 6 (six) hours. 07/24/22   Prosperi, Christian H, PA-C  polyethylene glycol (MIRALAX) packet Take 17 g by mouth daily. Patient not taking: Reported on 10/04/2018 04/29/18   Delia Heady, PA-C  zolpidem (AMBIEN) 10 MG tablet Take 10 mg by mouth at bedtime. 05/27/21   [provider]      Allergies    Patient has no known allergies.    Review of Systems   Review of Systems  Gastrointestinal:  Positive for abdominal pain, nausea and vomiting. Negative for constipation and diarrhea.    Physical Exam Updated Vital Signs BP 138/87 (BP Location: Left Arm)   Pulse 89   Temp 99.2 F (37.3 C) (Oral)   Resp 13   Ht '5\' 6"'$  (1.676 m)   Wt 72 kg   LMP  (LMP Unknown) Comment: ovarian failure  SpO2 100%   BMI 25.62 kg/m  Physical Exam Vitals and nursing note reviewed.  Constitutional:      Appearance: She is not ill-appearing or toxic-appearing.  HENT:     Head: Normocephalic and atraumatic.     Mouth/Throat:     Mouth: Mucous membranes are moist.     Pharynx: No oropharyngeal exudate or posterior oropharyngeal erythema.  Eyes:     General:        Right eye: No discharge.        Left eye: No discharge.     Conjunctiva/sclera: Conjunctivae normal.  Cardiovascular:     Rate and Rhythm: Normal rate and regular rhythm.     Pulses: Normal pulses.     Heart sounds: Normal heart sounds. No murmur heard. Pulmonary:     Effort: Pulmonary effort is normal. No respiratory distress.     Breath sounds: Normal breath sounds. No wheezing or rales.  Abdominal:     General: Bowel sounds are normal. There is no distension.     Tenderness: There is abdominal tenderness in the epigastric area and left upper quadrant. There is no right CVA  tenderness, left CVA tenderness, guarding or rebound.  Musculoskeletal:        General: No deformity.     Cervical back: Neck supple.  Skin:    General: Skin is warm and dry.  Neurological:     Mental Status: She is alert. Mental status is at baseline.  Psychiatric:        Mood and Affect: Mood normal.     ED Results / Procedures / Treatments   Labs (all labs ordered are listed, but only abnormal results are displayed) Labs Reviewed  CBC WITH DIFFERENTIAL/PLATELET - Abnormal; Notable for the following components:      Result Value   RBC 3.82 (*)    All other components within normal limits  URINALYSIS, ROUTINE W REFLEX MICROSCOPIC - Abnormal; Notable for the following components:   Color, Urine STRAW (*)    All other components within normal limits  COMPREHENSIVE METABOLIC PANEL  LIPASE, BLOOD    EKG None  Radiology CT ABDOMEN PELVIS W CONTRAST  Result Date: 08/24/2022 CLINICAL DATA:  Bowel obstruction suspected Nausea and vomiting. EXAM: CT ABDOMEN AND PELVIS WITH CONTRAST TECHNIQUE: Multidetector CT imaging of the abdomen and pelvis was performed using the standard protocol following bolus administration of intravenous contrast. RADIATION DOSE REDUCTION: This exam was performed according to the departmental dose-optimization program which includes automated exposure control, adjustment of the mA and/or kV according to patient size and/or use of iterative reconstruction technique. CONTRAST:  179m OMNIPAQUE IOHEXOL 300 MG/ML  SOLN COMPARISON:  CT 01/21/2019 FINDINGS: Lower chest: No basilar airspace disease or pleural effusion. Hepatobiliary: Small subcentimeter lesion in the left hepatic lobe is too small to characterize but likely small cyst or hemangioma. No specific imaging follow-up is recommended. Post cholecystectomy. There is stable biliary dilatation post cholecystectomy with common bile duct measuring 12 mm at the porta hepatis. No visualized choledocholithiasis. There is  similar central intrahepatic biliary prominence. Pancreas: No ductal dilatation or inflammation. Spleen: Normal in size without focal abnormality. Adrenals/Urinary Tract: Normal adrenal glands. No hydronephrosis or focal renal abnormality. No renal stone. Unremarkable urinary bladder. Stomach/Bowel: Mild pre pyloric gastric wall thickening, series 2, image 27. There are a few fluid-filled loops of small bowel in the pelvis that are mildly dilated. There is some associated small bowel wall thickening involving bowel loops in the deep pelvis with adjacent edema. No transition point or bowel obstruction. Appendectomy per history. There is a moderate volume of formed stool in the colon without colonic inflammation. Vascular/Lymphatic: Aortic atherosclerosis without aneurysm. Patent portal and splenic veins. No abdominopelvic adenopathy. Reproductive: Uterus and bilateral adnexa are unremarkable. Other: No ascites or free air. Musculoskeletal: There are no acute or suspicious osseous abnormalities. IMPRESSION: 1. Mild pre pyloric gastric wall  thickening, can be seen with gastritis or peptic ulcer disease. 2. Few fluid-filled loops of small bowel in the pelvis that are mildly dilated with some associated small bowel wall thickening with adjacent edema, suggesting enteritis. No transition point or bowel obstruction. 3. Moderate volume of formed stool in the colon without colonic inflammation. 4. Stable biliary dilatation post cholecystectomy. Recommend correlation with LFTs. If normal, no further evaluation is needed. If LFTs are elevated, consider further assessment with MRCP. Aortic Atherosclerosis (ICD10-I70.0). Electronically Signed   By: Keith Rake M.D.   On: 08/24/2022 23:50    Procedures Procedures    Medications Ordered in ED Medications  iohexol (OMNIPAQUE) 300 MG/ML solution 100 mL (100 mLs Intravenous Contrast Given 08/24/22 2306)    ED Course/ Medical Decision Making/ A&P                            Medical Decision Making 46 year old female with epigastric burning and reflux symptoms. No other chest pain.   Vital signs normal and intake after resolution of vomiting.  Cardiopulmonary and abdominal exams as above with epigastric tenderness palpation and left upper quadrant tenderness otherwise unremarkable.  Patient tolerating p.o. at this time.  Differential diagnosis of epigastric pain includes but is not limited to: Functional or nonulcer dyspepsia (MCC), PUD, GERD, Gastritis, (NSAIDs, alcohol, stress, H. pylori, pernicious anemia), pancreatitis / pancreatic cancer, overeating indigestion (high-fat foods, coffee), drugs (aspirin, antibiotics (eg, macrolides, metronidazole), corticosteroids, digoxin, narcotics, theophylline), gastroparesis, gastric volvulus, gastric cancer, lactose intolerance, malabsorption, parasitic infection (Giardia, Strongyloides, Ascaris), abdominal hernia, intestinal ischemia, esophageal rupture,  cholelithiasis /choledocholithiasis / cholangitis, hepatitis, ACS, pericarditis, pneumonia, pregnancy.   Amount and/or Complexity of Data Reviewed Labs:     Details: CBC leukocytosis or anemia, CMP with elevated total bili 1.7 near patient's baseline otherwise unremarkable.  Lipase is normal, urine without evidence of infection.   Radiology: ordered.    Details: Chest x-ray negative for acute cardiopulmonary disease.  CT abdomen pelvis with inflammation of the pylorus gastric wall, some fluid-filled small bowel loops concerning for enteritis, however patient asymptomatic from this perspective, no obstruction. ECG/medicine tests:     Details: EKG with tachycardia, no STEMI. Vomiting at that time.   Risk OTC drugs. Prescription drug management.     Clinical picture most consistent with gastritis.  Patient feeling improved after medications administered in ED, tolerating p.o. with improvement in GI cocktail.  Will discharge with prescription for omeprazole and  carafate.  Recommend close outpatient follow-up.  Clinical concern for more emergent underlying etiology that warrant further ED work-up or inpatient management exceedingly low  Daysie voiced understanding of her medical evaluation and treatment plan. Each of their questions answered to their expressed satisfaction.  Return precautions were given.  Patient is well-appearing, stable, and was discharged in good condition.  This chart was dictated using voice recognition software, Dragon. Despite the best efforts of this provider to proofread and correct errors, errors may still occur which can change documentation meaning.     Final Clinical Impression(s) / ED Diagnoses Final diagnoses:  None    Rx / DC Orders ED Discharge Orders     None         Aura Dials 08/25/22 3295    Palumbo, April, MD 08/26/22 0019

## 2022-08-25 NOTE — Discharge Instructions (Addendum)
You are seen in the ER today for your abdominal pain. You are experiencing gastritis which is inflammation of your stomach secondary to your reflux.  Please stop taking your dexilant and begin taking the omeprazole. You may use the prescribed carafate for flares ups of your symptoms. You may use the prescribed nausea medication as needed. Return to the ER with any new severe symptoms and follow up with your primary care doctor.

## 2022-08-26 ENCOUNTER — Observation Stay (HOSPITAL_COMMUNITY): Payer: Medicare Other

## 2022-08-26 ENCOUNTER — Other Ambulatory Visit: Payer: Self-pay

## 2022-08-26 ENCOUNTER — Encounter (HOSPITAL_COMMUNITY): Payer: Self-pay

## 2022-08-26 ENCOUNTER — Inpatient Hospital Stay (HOSPITAL_COMMUNITY)
Admission: EM | Admit: 2022-08-26 | Discharge: 2022-08-28 | DRG: 382 | Disposition: A | Discharge status: Home / Self Care | Payer: Medicare Other | Attending: Internal Medicine | Admitting: Internal Medicine

## 2022-08-26 DIAGNOSIS — M3501 Sicca syndrome with keratoconjunctivitis: Secondary | ICD-10-CM | POA: Diagnosis present

## 2022-08-26 DIAGNOSIS — K2950 Unspecified chronic gastritis without bleeding: Secondary | ICD-10-CM | POA: Diagnosis present

## 2022-08-26 DIAGNOSIS — D472 Monoclonal gammopathy: Secondary | ICD-10-CM | POA: Diagnosis present

## 2022-08-26 DIAGNOSIS — K529 Noninfective gastroenteritis and colitis, unspecified: Secondary | ICD-10-CM | POA: Diagnosis present

## 2022-08-26 DIAGNOSIS — Z9049 Acquired absence of other specified parts of digestive tract: Secondary | ICD-10-CM

## 2022-08-26 DIAGNOSIS — Z87891 Personal history of nicotine dependence: Secondary | ICD-10-CM

## 2022-08-26 DIAGNOSIS — R112 Nausea with vomiting, unspecified: Secondary | ICD-10-CM | POA: Diagnosis not present

## 2022-08-26 DIAGNOSIS — K219 Gastro-esophageal reflux disease without esophagitis: Secondary | ICD-10-CM | POA: Diagnosis present

## 2022-08-26 DIAGNOSIS — K2970 Gastritis, unspecified, without bleeding: Secondary | ICD-10-CM

## 2022-08-26 DIAGNOSIS — E2839 Other primary ovarian failure: Secondary | ICD-10-CM | POA: Diagnosis present

## 2022-08-26 DIAGNOSIS — E8889 Other specified metabolic disorders: Secondary | ICD-10-CM | POA: Diagnosis present

## 2022-08-26 DIAGNOSIS — E860 Dehydration: Secondary | ICD-10-CM | POA: Diagnosis present

## 2022-08-26 DIAGNOSIS — M351 Other overlap syndromes: Secondary | ICD-10-CM | POA: Diagnosis present

## 2022-08-26 DIAGNOSIS — Z79899 Other long term (current) drug therapy: Secondary | ICD-10-CM

## 2022-08-26 DIAGNOSIS — K2210 Ulcer of esophagus without bleeding: Principal | ICD-10-CM | POA: Diagnosis present

## 2022-08-26 DIAGNOSIS — M797 Fibromyalgia: Secondary | ICD-10-CM | POA: Diagnosis present

## 2022-08-26 DIAGNOSIS — M329 Systemic lupus erythematosus, unspecified: Secondary | ICD-10-CM | POA: Diagnosis present

## 2022-08-26 LAB — COMPREHENSIVE METABOLIC PANEL
ALT: 27 U/L (ref 0–44)
AST: 27 U/L (ref 15–41)
Albumin: 4.8 g/dL (ref 3.5–5.0)
Alkaline Phosphatase: 61 U/L (ref 38–126)
Anion gap: 9 (ref 5–15)
BUN: 15 mg/dL (ref 6–20)
CO2: 26 mmol/L (ref 22–32)
Calcium: 10 mg/dL (ref 8.9–10.3)
Chloride: 100 mmol/L (ref 98–111)
Creatinine, Ser: 0.75 mg/dL (ref 0.44–1.00)
GFR, Estimated: >60 mL/min (ref >60)
Glucose, Bld: 94 mg/dL (ref 70–99)
Potassium: 3.8 mmol/L (ref 3.5–5.1)
Sodium: 135 mmol/L (ref 135–145)
Total Bilirubin: 1.8 mg/dL — ABNORMAL HIGH (ref 0.3–1.2)
Total Protein: 9.1 g/dL — ABNORMAL HIGH (ref 6.5–8.1)

## 2022-08-26 LAB — URINALYSIS, ROUTINE W REFLEX MICROSCOPIC
Bilirubin Urine: NEGATIVE
Glucose, UA: NEGATIVE mg/dL
Hgb urine dipstick: NEGATIVE
Ketones, ur: 20 mg/dL — AB
Nitrite: NEGATIVE
Protein, ur: NEGATIVE mg/dL
Specific Gravity, Urine: 1.014 (ref 1.005–1.030)
pH: 6 (ref 5.0–8.0)

## 2022-08-26 LAB — PREGNANCY, URINE: Preg Test, Ur: NEGATIVE

## 2022-08-26 LAB — RAPID URINE DRUG SCREEN, HOSP PERFORMED
Amphetamines: NOT DETECTED
Barbiturates: NOT DETECTED
Benzodiazepines: POSITIVE — AB
Cocaine: NOT DETECTED
Opiates: POSITIVE — AB
Tetrahydrocannabinol: NOT DETECTED

## 2022-08-26 LAB — CBC
HCT: 37.9 % (ref 36.0–46.0)
Hemoglobin: 13.2 g/dL (ref 12.0–15.0)
MCH: 32.9 pg (ref 26.0–34.0)
MCHC: 34.8 g/dL (ref 30.0–36.0)
MCV: 94.5 fL (ref 80.0–100.0)
Platelets: 221 10*3/uL (ref 150–400)
RBC: 4.01 MIL/uL (ref 3.87–5.11)
RDW: 11 % — ABNORMAL LOW (ref 11.5–15.5)
WBC: 4.4 10*3/uL (ref 4.0–10.5)
nRBC: 0 % (ref 0.0–0.2)

## 2022-08-26 LAB — LIPASE, BLOOD: Lipase: 45 U/L (ref 11–51)

## 2022-08-26 MED ORDER — FAMOTIDINE IN NACL 20-0.9 MG/50ML-% IV SOLN
20.0000 mg | Freq: Once | INTRAVENOUS | Status: AC
  Administered 2022-08-26: 20 mg via INTRAVENOUS
  Filled 2022-08-26: qty 50

## 2022-08-26 MED ORDER — ONDANSETRON HCL 4 MG PO TABS: 4.0000 mg | ORAL_TABLET | Freq: Four times a day (QID) | ORAL | Status: DC | PRN

## 2022-08-26 MED ORDER — ONDANSETRON HCL 4 MG/2ML IJ SOLN
4.0000 mg | Freq: Once | INTRAMUSCULAR | Status: AC
Start: 2022-08-26 — End: 2022-08-26
  Administered 2022-08-26: 4 mg via INTRAVENOUS
  Filled 2022-08-26: qty 2

## 2022-08-26 MED ORDER — ACETAMINOPHEN 650 MG RE SUPP: 650.0000 mg | Freq: Four times a day (QID) | RECTAL | Status: DC | PRN

## 2022-08-26 MED ORDER — BUPROPION HCL ER (XL) 150 MG PO TB24
150.0000 mg | ORAL_TABLET | Freq: Every day | ORAL | Status: DC
  Administered 2022-08-26 – 2022-08-28 (×3): 150 mg via ORAL
  Filled 2022-08-26 (×3): qty 1

## 2022-08-26 MED ORDER — PROMETHAZINE HCL 25 MG/ML IJ SOLN
6.2500 mg | Freq: Four times a day (QID) | INTRAMUSCULAR | Status: DC | PRN
  Administered 2022-08-26 – 2022-08-28 (×3): 6.25 mg via INTRAVENOUS
  Filled 2022-08-26 (×9): qty 0.25

## 2022-08-26 MED ORDER — ZOLPIDEM TARTRATE 5 MG PO TABS
5.0000 mg | ORAL_TABLET | Freq: Every evening | ORAL | Status: DC | PRN
Start: 2022-08-26 — End: 2022-08-28
  Administered 2022-08-26 – 2022-08-27 (×2): 5 mg via ORAL
  Filled 2022-08-26 (×2): qty 1

## 2022-08-26 MED ORDER — DICYCLOMINE HCL 20 MG PO TABS
20.0000 mg | ORAL_TABLET | Freq: Four times a day (QID) | ORAL | Status: DC | PRN
  Administered 2022-08-26: 20 mg via ORAL
  Filled 2022-08-26 (×2): qty 1

## 2022-08-26 MED ORDER — ALUM & MAG HYDROXIDE-SIMETH 200-200-20 MG/5ML PO SUSP
30.0000 mL | Freq: Once | ORAL | Status: AC
  Administered 2022-08-26: 30 mL via ORAL
  Filled 2022-08-26: qty 30

## 2022-08-26 MED ORDER — ACETAMINOPHEN 500 MG PO TABS
1000.0000 mg | ORAL_TABLET | Freq: Four times a day (QID) | ORAL | Status: DC | PRN
Start: 2022-08-26 — End: 2022-08-26
  Administered 2022-08-26: 1000 mg via ORAL
  Filled 2022-08-26: qty 2

## 2022-08-26 MED ORDER — GABAPENTIN 300 MG PO CAPS
300.0000 mg | ORAL_CAPSULE | Freq: Two times a day (BID) | ORAL | Status: DC
  Administered 2022-08-26 – 2022-08-28 (×5): 300 mg via ORAL
  Filled 2022-08-26 (×5): qty 1

## 2022-08-26 MED ORDER — PANTOPRAZOLE SODIUM 40 MG IV SOLR
40.0000 mg | Freq: Two times a day (BID) | INTRAVENOUS | Status: DC
  Administered 2022-08-26 – 2022-08-27 (×4): 40 mg via INTRAVENOUS
  Filled 2022-08-26 (×4): qty 10

## 2022-08-26 MED ORDER — CITALOPRAM HYDROBROMIDE 20 MG PO TABS
10.0000 mg | ORAL_TABLET | Freq: Every day | ORAL | Status: DC
  Administered 2022-08-26 – 2022-08-28 (×3): 10 mg via ORAL
  Filled 2022-08-26 (×3): qty 1

## 2022-08-26 MED ORDER — ZOLPIDEM TARTRATE 5 MG PO TABS: 10.0000 mg | ORAL_TABLET | Freq: Every evening | ORAL | Status: DC | PRN

## 2022-08-26 MED ORDER — HYDROXYCHLOROQUINE SULFATE 200 MG PO TABS
200.0000 mg | ORAL_TABLET | Freq: Two times a day (BID) | ORAL | Status: DC
  Administered 2022-08-26 – 2022-08-28 (×4): 200 mg via ORAL
  Filled 2022-08-26 (×5): qty 1

## 2022-08-26 MED ORDER — DEXTROSE-NACL 5-0.9 % IV SOLN: INTRAVENOUS | Status: DC

## 2022-08-26 MED ORDER — ACETAMINOPHEN 325 MG PO TABS: 650.0000 mg | ORAL_TABLET | Freq: Four times a day (QID) | ORAL | Status: DC | PRN

## 2022-08-26 MED ORDER — ONDANSETRON HCL 4 MG/2ML IJ SOLN
4.0000 mg | Freq: Once | INTRAMUSCULAR | Status: AC
  Administered 2022-08-26: 4 mg via INTRAVENOUS
  Filled 2022-08-26: qty 2

## 2022-08-26 MED ORDER — HYDROCODONE-ACETAMINOPHEN 5-325 MG PO TABS
1.0000 | ORAL_TABLET | Freq: Two times a day (BID) | ORAL | Status: DC | PRN
  Administered 2022-08-26 – 2022-08-28 (×3): 1 via ORAL
  Filled 2022-08-26 (×3): qty 1

## 2022-08-26 MED ORDER — ENOXAPARIN SODIUM 40 MG/0.4ML IJ SOSY
40.0000 mg | PREFILLED_SYRINGE | Freq: Every day | INTRAMUSCULAR | Status: DC
  Administered 2022-08-26 – 2022-08-28 (×3): 40 mg via SUBCUTANEOUS
  Filled 2022-08-26 (×3): qty 0.4

## 2022-08-26 MED ORDER — ONDANSETRON HCL 4 MG/2ML IJ SOLN: 4.0000 mg | Freq: Four times a day (QID) | INTRAMUSCULAR | Status: DC | PRN

## 2022-08-26 MED ORDER — LACTATED RINGERS IV BOLUS
1000.0000 mL | Freq: Once | INTRAVENOUS | Status: AC
  Administered 2022-08-26: 1000 mL via INTRAVENOUS

## 2022-08-26 MED ORDER — METOCLOPRAMIDE HCL 5 MG/ML IJ SOLN
10.0000 mg | Freq: Once | INTRAMUSCULAR | Status: AC
  Administered 2022-08-26: 10 mg via INTRAVENOUS
  Filled 2022-08-26: qty 2

## 2022-08-26 MED ORDER — TIZANIDINE HCL 4 MG PO TABS
4.0000 mg | ORAL_TABLET | Freq: Every day | ORAL | Status: DC
  Administered 2022-08-26 – 2022-08-27 (×2): 4 mg via ORAL
  Filled 2022-08-26 (×2): qty 1

## 2022-08-26 NOTE — H&P (Signed)
History and Physical    Kim Fitzgerald IFO:[id number] DOB: 09-Aug-1976 DOA: 08/26/2022  PCP: Tomasa Hose, NP   Patient coming from:Home Chief Complaint  Patient presents with   Abdominal Pain   Emesis   HPI:46 year old female with history of MGUS (currently stable last seen at [facility] 4/18//23 with plan for 25-monthfollow-up), Preliminary ovarian failure, autoimmune disease-undifferentiated connective tissue disorder, Sjogren's syndrome with keratoconjunctivitis sicca/fibromyalgia-chronic pain [facility], history of partial SBO cholecystectomy presents again with intractable nausea vomiting and epigastric pain/discomfort. [facility] similar symptoms of nausea vomiting abdominal pain, CLD work-up with CT abdomen pelvis CBC, CMP, lipase on 9/5 and fairly unremarkable. She has had bowel movement since then and continues to have nausea vomiting difficulty to tolerate diet presents to the ED. She describes it was burning pain form upper chest to upper abdomen onset 3 days ago after dinner at friend's home-ate too many combination of  food. She had similar milder symptoms when she had partial SBO. Patient otherwise denies any chest pain, shortness of breath, fever,( although had chills) headache, focal weakness, numbness tingling, speech difficulties   In ED: CMP CBC lipase unremarkable UA with ketones 20, LE large, bacteria rare WBC 6-10. Patient still symptomatic despite Reglan IV fluids Pepcid, Maalox so admission was requested for hydration and diet challenge.   Assessment/Plan  Intractable nausea and vomiting GERD: Work-up so far unremarkable with CT abdomen pelvis 9/5, labs stable CBC CMP lipase.  Wonder if it is related to her Sjogren's syndrome/ MCTD.  She takes ibuprofen weekly. Continue symptomatic management IV hydration Protonix twice daily Reglan/Zofran, IV fluids.Keep on clear  liquid diet, consulted [facility]. NPO past MN.Has had bowel movement no evidence of bowel obstruction get x-ray abdomen flat.denies any THC use.  Dehydration/ketosis in urine: Due to intractable nausea vomiting.  Keep on-hydration  Undifferentiated connective tissue disorder Sjogren's syndrome with keratoconjunctivitis sicca Fibromyalgia-chronic pain: [facility]  Patient chronically ON Plaquenil,Cymbalta,and on norco resume home meds once med rec done.  MGUS: sees onco q 6 mo and stable  Body mass index is 25.62 kg/m.   Severity of Illness: The appropriate patient status for this patient is OBSERVATION. Observation status is judged to be reasonable and necessary in order to provide the required intensity of service to ensure the patient's safety. The patient's presenting symptoms, physical exam findings, and initial radiographic and laboratory data in the context of their medical condition is felt to place them at decreased risk for further clinical deterioration. Furthermore, it is anticipated that the patient will be medically stable for discharge from the hospital within 2 midnights of admission.    DVT prophylaxis: enoxaparin (LOVENOX) injection 40 mg Start: 08/26/22 1630 Code Status:   Code Status: Full Code  Family Communication: Admission, patients condition and plan of care including tests being ordered have been discussed with the patient who indicate understanding and agree with the plan and Code Status.  Consults called:  GI  Review of Systems: All systems were reviewed and were negative except as mentioned in HPI above. Negative for fever Negative for chest pain Negative for shortness of breath  Past Medical History:  Diagnosis Date   GERD (gastroesophageal reflux disease)    H. pylori infection     treated twice   Lupus (HCC)    Ovarian failure    S/P colonoscopy  2006   [place] w[facility](PAC), 1 polyp-benign    Undifferentiated connective tissue disease ([place]) 2017    Past Surgical History:  Procedure Laterality Date   APPENDECTOMY     CHOLECYSTECTOMY     cholelithiasis   DILATATION & CURRETTAGE/HYSTEROSCOPY WITH RESECTOCOPE N/A 08/15/2014   Procedure: DILATATION & CURETTAGE, HYSTEROSCOPY WITH myosure;  Surgeon: Marvene Staff, MD;  Location: [facility];  Service: Gynecology;  Laterality: N/A;   ESOPHAGOGASTRODUODENOSCOPY  07/16/2011   mild gastritis   OVARIAN CYST REMOVAL  2010   laproscopic     reports that she quit smoking about 8 years ago. Her smoking use included e-cigarettes. She has a 12.00 pack-year smoking history. She has never used smokeless tobacco. She reports that she does not drink alcohol and does not use drugs.  No Known Allergies  Family History  Problem Relation Age of Onset   Heart failure Mother    Alcohol abuse Father    Crohn's disease Brother 45     Prior to Admission medications   Medication Sig Start Date End Date Taking? Authorizing Provider  buPROPion (WELLBUTRIN XL) 150 MG 24 hr tablet Take 150 mg by mouth daily. 07/17/18  Yes [provider]  citalopram (CELEXA) 10 MG tablet Take 10 mg by mouth daily. 11/08/19  Yes [provider]  dicyclomine (BENTYL) 20 MG tablet Take 20 mg by mouth 4 (four) times daily as needed for spasms. 08/24/22  Yes [provider]  gabapentin (NEURONTIN) 300 MG capsule Take 300 mg by mouth 2 (two) times daily. 06/24/17  Yes [provider]  HYDROcodone-acetaminophen (NORCO/VICODIN) 5-325 MG tablet Take 1 tablet by mouth 2 (two) times daily as needed for pain. 07/24/22  Yes [provider]  hydroxychloroquine (PLAQUENIL) 200 MG tablet Take 200 mg by mouth 2 (two) times daily.    [provider]  ibuprofen (ADVIL,MOTRIN) 200 MG tablet Take 400 mg by mouth every 6 (six) hours as needed for moderate pain.    [provider]  meclizine (ANTIVERT) 25 MG tablet Take 1 tablet (25 mg  total) by mouth 3 (three) times daily as needed for dizziness. Patient not taking: Reported on 07/24/2022 02/05/18   Orpah Greek, MD  methocarbamol (ROBAXIN) 500 MG tablet Take 500 mg by mouth at bedtime as needed. 07/24/22   [provider]  methotrexate (RHEUMATREX) 2.5 MG tablet Take 20 mg by mouth once a week. Pt states this medication is currently on hold 01/18/20   [provider]  omeprazole (PRILOSEC) 40 MG capsule Take 1 capsule (40 mg total) by mouth daily. 08/25/22   Sponseller, Eugene Garnet R, PA-C  ondansetron (ZOFRAN) 4 MG tablet Take 1 tablet (4 mg total) by mouth every 6 (six) hours. 07/24/22   Prosperi, Christian H, PA-C  ondansetron (ZOFRAN-ODT) 4 MG disintegrating tablet Take 1 tablet (4 mg total) by mouth every 8 (eight) hours as needed for nausea or vomiting. 08/25/22   Sponseller, Eugene Garnet R, PA-C  polyethylene glycol (MIRALAX) packet Take 17 g by mouth daily. Patient not taking: Reported on 10/04/2018 04/29/18   Delia Heady, PA-C  promethazine (PHENERGAN) 25 MG tablet Take by mouth. 08/24/22   [provider]  sucralfate (CARAFATE) 1 GM/10ML suspension Take 10 mLs (1 g total) by mouth 3 (three) times daily as needed. 08/25/22   Sponseller, Eugene Garnet R, PA-C  tiZANidine (ZANAFLEX) 4 MG tablet Take 4 mg by mouth 3 (three) times daily. 06/25/22   [provider]  zolpidem (AMBIEN) 10 MG tablet Take  10 mg by mouth at bedtime. 05/27/21   [provider]    Physical Exam: Vitals:   08/26/22 1245 08/26/22 1300 08/26/22 1315 08/26/22 1400  BP: (!) 140/89 (!) 143/93 (!) 142/93 (!) 141/92  Pulse:   86   Resp: 13 (!) 24 (!) 23 12  Temp:   99 F (37.2 C)   TempSrc:   Oral   SpO2:   100%   Weight:      Height:       General exam: AAOx3, pleasant,NAD, weak appearing. HEENT:Oral mucosa moist, Ear/Nose WNL grossly, dentition normal. Respiratory system: bilaterally clear,no wheezing or crackles,no use of accessory muscle Cardiovascular system: S1 & S2  +, No JVD,. Gastrointestinal system: Abdomen soft,tender epigastrium,ND, BS+ Nervous System:Alert, awake, moving extremities and grossly nonfocal Extremities: No edema, distal peripheral pulses palpable.  Skin: No rashes,no icterus. MSK: Normal muscle bulk,tone, power   Labs on Admission: I have personally reviewed following labs and imaging studies  CBC: Recent Labs  Lab 08/24/22 2224 08/26/22 0849  WBC 4.7 4.4  NEUTROABS 2.7  --   HGB 12.9 13.2  HCT 37.1 37.9  MCV 97.1 94.5  PLT 224 454   Basic Metabolic Panel: Recent Labs  Lab 08/25/22 0547 08/26/22 0849  NA 136 135  K 3.6 3.8  CL 102 100  CO2 26 26  GLUCOSE 94 94  BUN 14 15  CREATININE 0.74 0.75  CALCIUM 9.4 10.0   GFR: Estimated Creatinine Clearance: 87.4 mL/min (by C-G formula based on SCr of 0.75 mg/dL). Liver Function Tests: Recent Labs  Lab 08/25/22 0547 08/26/22 0849  AST 33 27  ALT 31 27  ALKPHOS 59 61  BILITOT 1.7* 1.8*  PROT 9.0* 9.1*  ALBUMIN 4.4 4.8   Recent Labs  Lab 08/25/22 0547 08/26/22 0849  LIPASE 42 45   Urine analysis:    Component Value Date/Time   COLORURINE YELLOW 08/26/2022 1139   APPEARANCEUR CLEAR 08/26/2022 1139   LABSPEC 1.014 08/26/2022 1139   PHURINE 6.0 08/26/2022 1139   GLUCOSEU NEGATIVE 08/26/2022 1139   HGBUR NEGATIVE 08/26/2022 1139   BILIRUBINUR NEGATIVE 08/26/2022 1139   KETONESUR 20 (A) 08/26/2022 1139   PROTEINUR NEGATIVE 08/26/2022 1139   UROBILINOGEN 0.2 04/10/2013 1300   NITRITE NEGATIVE 08/26/2022 1139   LEUKOCYTESUR LARGE (A) 08/26/2022 1139    Radiological Exams on Admission: DG Chest 2 View  Result Date: 08/25/2022 CLINICAL DATA:  Chest pressure. Mid upper abdominal pain, cough and congestion and shortness of breath for 3 days. EXAM: CHEST - 2 VIEW COMPARISON:  07/24/2022 FINDINGS: The heart size and mediastinal contours are within normal limits. Both lungs are clear. The visualized skeletal structures are unremarkable. IMPRESSION: No active  cardiopulmonary disease. Electronically Signed   By: Kerby Moors M.D.   On: 08/25/2022 07:07   CT ABDOMEN PELVIS W CONTRAST  Result Date: 08/24/2022 CLINICAL DATA:  Bowel obstruction suspected Nausea and vomiting. EXAM: CT ABDOMEN AND PELVIS WITH CONTRAST TECHNIQUE: Multidetector CT imaging of the abdomen and pelvis was performed using the standard protocol following bolus administration of intravenous contrast. RADIATION DOSE REDUCTION: This exam was performed according to the departmental dose-optimization program which includes automated exposure control, adjustment of the mA and/or kV according to patient size and/or use of iterative reconstruction technique. CONTRAST:  166m OMNIPAQUE IOHEXOL 300 MG/ML  SOLN COMPARISON:  CT 01/21/2019 FINDINGS: Lower chest: No basilar airspace disease or pleural effusion. Hepatobiliary: Small subcentimeter lesion in the left hepatic lobe is too small to characterize but  likely small cyst or hemangioma. No specific imaging follow-up is recommended. Post cholecystectomy. There is stable biliary dilatation post cholecystectomy with common bile duct measuring 12 mm at the porta hepatis. No visualized choledocholithiasis. There is similar central intrahepatic biliary prominence. Pancreas: No ductal dilatation or inflammation. Spleen: Normal in size without focal abnormality. Adrenals/Urinary Tract: Normal adrenal glands. No hydronephrosis or focal renal abnormality. No renal stone. Unremarkable urinary bladder. Stomach/Bowel: Mild pre pyloric gastric wall thickening, series 2, image 27. There are a few fluid-filled loops of small bowel in the pelvis that are mildly dilated. There is some associated small bowel wall thickening involving bowel loops in the deep pelvis with adjacent edema. No transition point or bowel obstruction. Appendectomy per history. There is a moderate volume of formed stool in the colon without colonic inflammation. Vascular/Lymphatic: Aortic  atherosclerosis without aneurysm. Patent portal and splenic veins. No abdominopelvic adenopathy. Reproductive: Uterus and bilateral adnexa are unremarkable. Other: No ascites or free air. Musculoskeletal: There are no acute or suspicious osseous abnormalities. IMPRESSION: 1. Mild pre pyloric gastric wall thickening, can be seen with gastritis or peptic ulcer disease. 2. Few fluid-filled loops of small bowel in the pelvis that are mildly dilated with some associated small bowel wall thickening with adjacent edema, suggesting enteritis. No transition point or bowel obstruction. 3. Moderate volume of formed stool in the colon without colonic inflammation. 4. Stable biliary dilatation post cholecystectomy. Recommend correlation with LFTs. If normal, no further evaluation is needed. If LFTs are elevated, consider further assessment with MRCP. Aortic Atherosclerosis (ICD10-I70.0). Electronically Signed   By: Keith Rake M.D.   On: 08/24/2022 23:50      Antonieta Pert MD [facility]  If 7PM-7AM, please contact night-coverage [email or web address]  08/26/2022, 3:23 PM

## 2022-08-26 NOTE — Plan of Care (Signed)
  Problem: Education: Goal: Knowledge of General Education information will improve Description Including pain rating scale, medication(s)/side effects and non-pharmacologic comfort measures Outcome: Progressing   

## 2022-08-26 NOTE — ED Provider Notes (Signed)
[facility] DEPT Provider Note   CSN: [id number] Arrival date & time: 08/26/22  4356     History  Chief Complaint  Patient presents with   Abdominal Pain   Emesis    KADEN DUNKEL is a 46 y.o. female w/ GERD, sjogren's disease, undifferentiated CTD, h/o partial SBO, h/o cholecystectomy who p/w N/V, epigastric pain.   Patient presents with several days of nausea/vomiting, associated with anorexia and inability to take PO (liquids/solids/meds), and feeling very generally weak. Feels the nausea all throughout her abdomen and has burning in her chest associated with it. States she presented to the ED a few days ago and was told she may have GERD; was given "some medication through the IV" at that time and felt better so was discharged home. Pain starts up in her upper chest/throat area and goes straight down to her suprapubic region, worse in epigastric region. Has felt it before but never this severe. Felt similar when she had her SBO, but doesn't think it was as bad as this. Last BM was yesterday, normal, and she is continuing to pass flatus. Denies fevers/chills, flu-like symptoms, CP, SOB, diarrhea/constipation, urinary symptoms, vaginal symptoms. Is post-menopausal.  Denies THC use.   Abdominal Pain Associated symptoms: vomiting   Emesis Associated symptoms: abdominal pain        Home Medications Prior to Admission medications   Medication Sig Start Date End Date Taking? Authorizing Provider  buPROPion (WELLBUTRIN XL) 150 MG 24 hr tablet Take 150 mg by mouth daily. 07/17/18  Yes [provider]  citalopram (CELEXA) 10 MG tablet Take 10 mg by mouth daily. 11/08/19  Yes [provider]  dicyclomine (BENTYL) 20 MG tablet Take 20 mg by mouth 4 (four) times daily as needed for spasms. 08/24/22  Yes [provider]  gabapentin (NEURONTIN) 300 MG capsule Take 300 mg by mouth 2 (two) times daily. 06/24/17  Yes [provider]  HYDROcodone-acetaminophen (NORCO/VICODIN) 5-325 MG tablet Take 1 tablet by mouth 2 (two) times daily as needed for pain. 07/24/22  Yes [provider]  hydroxychloroquine (PLAQUENIL) 200 MG tablet Take 200 mg by mouth 2 (two) times daily.   Yes [provider]  ibuprofen (ADVIL,MOTRIN) 200 MG tablet Take 400 mg by mouth every 6 (six) hours as needed for moderate pain.   Yes [provider]  ondansetron (ZOFRAN-ODT) 4 MG disintegrating tablet Take 1 tablet (4 mg total) by mouth every 8 (eight) hours as needed for nausea or vomiting. 08/25/22  Yes Sponseller, Gypsy Balsam, PA-C  promethazine (PHENERGAN) 25 MG tablet Take 25 mg by mouth every 6 (six) hours as needed for nausea or vomiting. 08/24/22  Yes [provider]  sucralfate (CARAFATE) 1 GM/10ML suspension Take 10 mLs (1 g total) by mouth 3 (three) times daily as needed. Patient taking differently: Take 1 g by mouth 3 (three) times daily as needed (ulcers). 08/25/22  Yes Sponseller, Rebekah R, PA-C  tiZANidine (ZANAFLEX) 4 MG tablet Take 4 mg by mouth at bedtime. 06/25/22  Yes [provider]  zolpidem (AMBIEN) 10 MG tablet Take 10 mg by mouth at bedtime. 05/27/21  Yes [provider]  meclizine (ANTIVERT) 25 MG tablet Take 1 tablet (25 mg total) by mouth 3 (three) times daily as needed for dizziness. Patient not taking: Reported on 07/24/2022 02/05/18   Orpah Greek, MD  methotrexate (RHEUMATREX) 2.5 MG tablet Take 20 mg by mouth once a week. Pt states this medication is currently  on hold 01/18/20   [provider]  omeprazole (PRILOSEC) 40 MG capsule Take 1 capsule (40 mg total) by mouth daily. Patient not taking: Reported on 08/26/2022 08/25/22   Sponseller, Eugene Garnet R, PA-C  ondansetron (ZOFRAN) 4 MG tablet Take 1 tablet (4 mg total) by mouth every 6 (six) hours. Patient not taking: Reported on 08/26/2022 07/24/22   Prosperi, Christian H, PA-C  polyethylene glycol [facility]) packet Take 17 g by  mouth daily. Patient not taking: Reported on 10/04/2018 04/29/18   Delia Heady, PA-C      Allergies    Patient has no known allergies.    Review of Systems   Review of Systems  Gastrointestinal:  Positive for abdominal pain and vomiting.  Review of systems negative for fevers/chills.  A 10 point review of systems was performed and is negative unless otherwise reported in HPI.   Physical Exam Updated Vital Signs BP 120/86 (BP Location: Left Arm)   Pulse 73   Temp 98.5 F (36.9 C) (Oral)   Resp 16   Ht 5' 6"  (1.676 m)   Wt 72 kg   LMP  (LMP Unknown) Comment: ovarian failure  SpO2 100%   BMI 25.62 kg/m  Physical Exam General: Normal appearing female, lying in bed. Appears tired.  HEENT: PERRLA, Sclera anicteric, MMM, trachea midline. Cardiology: RRR, no murmurs/rubs/gallops. BL radial and DP pulses equal bilaterally.  Resp: Normal respiratory rate and effort. CTAB, no wheezes, rhonchi, crackles.  Abd: Soft, non-distended. Mild diffuse tenderness with no focal tenderness. No rebound tenderness or guarding.  GU: Deferred. MSK: No peripheral edema or signs of trauma. Extremities without deformity or TTP. No cyanosis or clubbing. Skin: warm, dry. No rashes or lesions. Neuro: A&Ox4, CNs II-XII grossly intact. MAEs. Sensation grossly intact.  Psych: Normal mood and affect.   ED Results / Procedures / Treatments   Labs (all labs ordered are listed, but only abnormal results are displayed) Labs Reviewed  COMPREHENSIVE METABOLIC PANEL - Abnormal; Notable for the following components:      Result Value   Total Protein 9.1 (*)    Total Bilirubin 1.8 (*)    All other components within normal limits  CBC - Abnormal; Notable for the following components:   RDW 11.0 (*)    All other components within normal limits  URINALYSIS, ROUTINE W REFLEX MICROSCOPIC - Abnormal; Notable for the following components:   Ketones, ur 20 (*)    Leukocytes,Ua LARGE (*)    Bacteria, UA RARE (*)     All other components within normal limits  RAPID URINE DRUG SCREEN, HOSP PERFORMED - Abnormal; Notable for the following components:   Opiates POSITIVE (*)    Benzodiazepines POSITIVE (*)    All other components within normal limits  LIPASE, BLOOD  PREGNANCY, URINE  HIV ANTIBODY (ROUTINE TESTING W REFLEX)  COMPREHENSIVE METABOLIC PANEL  CBC    EKG EKG Interpretation  Date/Time:  Thursday August 26 2022 08:26:31 EDT Ventricular Rate:  102 PR Interval:  126 QRS Duration: 88 QT Interval:  368 QTC Calculation: 480 R Axis:   19 Text Interpretation: Sinus tachycardia Biatrial enlargement Anteroseptal infarct, old Confirmed by Cindee Lame [phone]) on 08/26/2022 2:29:56 PM  Radiology Abd 1 View (KUB)  Result Date: 08/26/2022 CLINICAL DATA:  Nausea and vomiting, abdominal pain for 3 days. EXAM: ABDOMEN - 1 VIEW COMPARISON:  CT 08/24/2022 FINDINGS: The bowel gas pattern is nonobstructive. There is no supine evidence of free air or bowel wall thickening. A moderate amount of  stool is present throughout the colon. Gas extends to the rectum. Cholecystectomy clips are noted. The bones appear unremarkable. Numerous telemetry leads overlie the lower chest and abdomen. IMPRESSION: Nonobstructive bowel gas pattern. Moderate stool throughout the colon. Electronically Signed   By: Richardean Sale M.D.   On: 08/26/2022 16:03   DG Chest 2 View  Result Date: 08/25/2022 CLINICAL DATA:  Chest pressure. Mid upper abdominal pain, cough and congestion and shortness of breath for 3 days. EXAM: CHEST - 2 VIEW COMPARISON:  07/24/2022 FINDINGS: The heart size and mediastinal contours are within normal limits. Both lungs are clear. The visualized skeletal structures are unremarkable. IMPRESSION: No active cardiopulmonary disease. Electronically Signed   By: Kerby Moors M.D.   On: 08/25/2022 07:07   CT ABDOMEN PELVIS W CONTRAST  Result Date: 08/24/2022 CLINICAL DATA:  Bowel obstruction suspected Nausea and  vomiting. EXAM: CT ABDOMEN AND PELVIS WITH CONTRAST TECHNIQUE: Multidetector CT imaging of the abdomen and pelvis was performed using the standard protocol following bolus administration of intravenous contrast. RADIATION DOSE REDUCTION: This exam was performed according to the departmental dose-optimization program which includes automated exposure control, adjustment of the mA and/or kV according to patient size and/or use of iterative reconstruction technique. CONTRAST:  178m OMNIPAQUE IOHEXOL 300 MG/ML  SOLN COMPARISON:  CT 01/21/2019 FINDINGS: Lower chest: No basilar airspace disease or pleural effusion. Hepatobiliary: Small subcentimeter lesion in the left hepatic lobe is too small to characterize but likely small cyst or hemangioma. No specific imaging follow-up is recommended. Post cholecystectomy. There is stable biliary dilatation post cholecystectomy with common bile duct measuring 12 mm at the porta hepatis. No visualized choledocholithiasis. There is similar central intrahepatic biliary prominence. Pancreas: No ductal dilatation or inflammation. Spleen: Normal in size without focal abnormality. Adrenals/Urinary Tract: Normal adrenal glands. No hydronephrosis or focal renal abnormality. No renal stone. Unremarkable urinary bladder. Stomach/Bowel: Mild pre pyloric gastric wall thickening, series 2, image 27. There are a few fluid-filled loops of small bowel in the pelvis that are mildly dilated. There is some associated small bowel wall thickening involving bowel loops in the deep pelvis with adjacent edema. No transition point or bowel obstruction. Appendectomy per history. There is a moderate volume of formed stool in the colon without colonic inflammation. Vascular/Lymphatic: Aortic atherosclerosis without aneurysm. Patent portal and splenic veins. No abdominopelvic adenopathy. Reproductive: Uterus and bilateral adnexa are unremarkable. Other: No ascites or free air. Musculoskeletal: There are no acute  or suspicious osseous abnormalities. IMPRESSION: 1. Mild pre pyloric gastric wall thickening, can be seen with gastritis or peptic ulcer disease. 2. Few fluid-filled loops of small bowel in the pelvis that are mildly dilated with some associated small bowel wall thickening with adjacent edema, suggesting enteritis. No transition point or bowel obstruction. 3. Moderate volume of formed stool in the colon without colonic inflammation. 4. Stable biliary dilatation post cholecystectomy. Recommend correlation with LFTs. If normal, no further evaluation is needed. If LFTs are elevated, consider further assessment with MRCP. Aortic Atherosclerosis (ICD10-I70.0). Electronically Signed   By: MKeith RakeM.D.   On: 08/24/2022 23:50    Procedures Procedures    Medications Ordered in ED Medications  enoxaparin (LOVENOX) injection 40 mg (40 mg Subcutaneous Given 08/26/22 1707)  dextrose 5 %-0.9 % sodium chloride infusion ( Intravenous New Bag/Given 08/26/22 1652)  acetaminophen (TYLENOL) tablet 650 mg (has no administration in time range)    Or  acetaminophen (TYLENOL) suppository 650 mg (has no administration in time range)  ondansetron (ZOFRAN) tablet  4 mg (has no administration in time range)    Or  ondansetron (ZOFRAN) injection 4 mg (has no administration in time range)  promethazine (PHENERGAN) 6.25 mg in sodium chloride 0.9 % 50 mL IVPB (has no administration in time range)  pantoprazole (PROTONIX) injection 40 mg (40 mg Intravenous Given 08/26/22 1707)  citalopram (CELEXA) tablet 10 mg (10 mg Oral Given 08/26/22 1707)  dicyclomine (BENTYL) tablet 20 mg (has no administration in time range)  buPROPion (WELLBUTRIN XL) 24 hr tablet 150 mg (150 mg Oral Given 08/26/22 1707)  gabapentin (NEURONTIN) capsule 300 mg (300 mg Oral Given 08/26/22 1707)  HYDROcodone-acetaminophen (NORCO/VICODIN) 5-325 MG per tablet 1 tablet (has no administration in time range)  hydroxychloroquine (PLAQUENIL) tablet 200 mg (200 mg  Oral Given 08/26/22 1707)  tiZANidine (ZANAFLEX) tablet 4 mg (has no administration in time range)  zolpidem (AMBIEN) tablet 5 mg (has no administration in time range)  alum & mag hydroxide-simeth (MAALOX/MYLANTA) 200-200-20 MG/5ML suspension 30 mL (30 mLs Oral Given 08/26/22 1119)  metoCLOPramide (REGLAN) injection 10 mg (10 mg Intravenous Given 08/26/22 1119)  famotidine (PEPCID) IVPB 20 mg premix (0 mg Intravenous Stopped 08/26/22 1235)  ondansetron (ZOFRAN) injection 4 mg (4 mg Intravenous Given 08/26/22 1302)  lactated ringers bolus 1,000 mL (1,000 mLs Intravenous New Bag/Given 08/26/22 1303)  ondansetron (ZOFRAN) injection 4 mg (4 mg Intravenous Given 08/26/22 1447)    ED Course/ Medical Decision Making/ A&P                          Medical Decision Making Amount and/or Complexity of Data Reviewed Labs: ordered. Decision-making details documented in ED Course.  Risk OTC drugs. Prescription drug management. Decision regarding hospitalization.   Patient appears tired and uncomfortable but is overall non-toxic appearing. Vitally stable, afebrile.   Patient just had CT abdomen pelvis on 08/24/22 with gastritis evidenced, no SBO present. Over the last two days has had normal bowel movements and is continuing to pass gas, symptoms are the same, so I do not believe another CT abdomen/pelvis is necessary today. Suspicion for SBO, appendicitis, cholecystitis is low. Patient is vitally stable, and c/f life-threatening etiology of her symptoms such as bacteremia or perforated viscous is also low. Consider patient's known gastritis, consider colitis/enteritis, or pancreatitis. Will obtain labs and given fluids/antiemetics/antacids.    Clinical Course as of 08/26/22 1912  Thu Aug 26, 2022  1142 Lipase: 45 wnl [HN]  1142 WBC: 4.4 No leukocytosis [HN]  1142 Total Bilirubin(!): 1.8 T bili slightly elevated but alk phos and AST/ALT wnl [HN]  1209 Leukocytes,Ua(!): LARGE [HN]  1209 Bacteria, UA(!): RARE  [HN]  1209 Ketones, ur(!): 20 [HN]  1209 Nitrite: NEGATIVE [HN]  1243 Patient still feels very nauseated after pepcid, mylanta, and reglan. Will try zofran and give a liter of fluids and reevaluate. Pending u preg as well.  [HN]  1249 Preg Test, Ur: NEGATIVE [HN]  1430 Patient reevaluated. Still extremely nauseated and states she cannot take PO. Will give additional zofran and consult to hospitalist for inability to take PO.  [HN]    Clinical Course User Index [HN] Audley Hose, MD   Patient made aware of her results. Unclear cause of intractable N/V. Despite treatment, exam unchanged from arrival to ED. Patient admitted to hospitalist for inability to tolerate PO. HDS at time of admission.  Dispo: Admit         Final Clinical Impression(s) / ED Diagnoses Final diagnoses:  Nausea  and vomiting, unspecified vomiting type    Rx / DC Orders ED Discharge Orders     None         Audley Hose, MD 08/26/22 [phone]

## 2022-08-26 NOTE — ED Triage Notes (Signed)
Patient c/o  epigastric pain, SOB, N/V x 4 days. Patient states she was seen 2 days ago, but throws up the meds that were prescribed.

## 2022-08-26 NOTE — Hospital Course (Addendum)
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 [facility] symptoms of nausea vomiting abdominal pain, CLD work-up with CT abdomen pelvis CBC, CMP, lipase on 9/5 and fairly unremarkable. She has had bowel movement since then and continues to have nausea vomiting difficulty to tolerate diet presents to the ED. She describes it was burning pain form upper chest to upper abdomen onset 3 days ago after dinner at friend's home-ate too many combination of  food. She had similar milder symptoms when she had partial SBO. Patient otherwise denies any chest pain, shortness of breath, fever,( although had chills) headache, focal weakness, numbness tingling, speech difficulties   In ED: CMP CBC lipase unremarkable UA with ketones 20, LE large, bacteria rare WBC 6-10. Patient still symptomatic despite Reglan IV fluids Pepcid, Maalox. Patient was admitted, conservatively treated, seen by GI underwent EGD "EGD showed LA grade C erosive esophagitis and gastritis.  Biopsies taken"  GI Recommendations -Recommend Protonix 40 mg twice a day for 4 weeks followed by Protonix 40 mg once a day until repeat EGD is done to document healing of esophagitis. -Repeat EGD in 2 to 3 months to document healing -Carafate 1 g twice daily for 2 weeks -Avoid NSAID, smoking and alcohol use. -No further inpatient GI work-up planned. Follow-up in GI clinic in 2 months after discharge"

## 2022-08-27 ENCOUNTER — Inpatient Hospital Stay (HOSPITAL_COMMUNITY): Payer: Medicare Other | Admitting: Certified Registered Nurse Anesthetist

## 2022-08-27 ENCOUNTER — Encounter (HOSPITAL_COMMUNITY)
Admission: EM | Disposition: A | Discharge status: Home / Self Care | Payer: Self-pay | Source: Home / Self Care | Attending: Internal Medicine

## 2022-08-27 DIAGNOSIS — K208 Other esophagitis without bleeding: Secondary | ICD-10-CM

## 2022-08-27 DIAGNOSIS — Z9049 Acquired absence of other specified parts of digestive tract: Secondary | ICD-10-CM | POA: Diagnosis not present

## 2022-08-27 DIAGNOSIS — R1110 Vomiting, unspecified: Secondary | ICD-10-CM | POA: Diagnosis present

## 2022-08-27 DIAGNOSIS — M797 Fibromyalgia: Secondary | ICD-10-CM | POA: Diagnosis present

## 2022-08-27 DIAGNOSIS — K2210 Ulcer of esophagus without bleeding: Secondary | ICD-10-CM | POA: Diagnosis present

## 2022-08-27 DIAGNOSIS — K2950 Unspecified chronic gastritis without bleeding: Secondary | ICD-10-CM

## 2022-08-27 DIAGNOSIS — M329 Systemic lupus erythematosus, unspecified: Secondary | ICD-10-CM | POA: Diagnosis present

## 2022-08-27 DIAGNOSIS — E860 Dehydration: Secondary | ICD-10-CM | POA: Diagnosis present

## 2022-08-27 DIAGNOSIS — R112 Nausea with vomiting, unspecified: Secondary | ICD-10-CM | POA: Diagnosis not present

## 2022-08-27 DIAGNOSIS — Z79899 Other long term (current) drug therapy: Secondary | ICD-10-CM | POA: Diagnosis not present

## 2022-08-27 DIAGNOSIS — M3501 Sicca syndrome with keratoconjunctivitis: Secondary | ICD-10-CM | POA: Diagnosis present

## 2022-08-27 DIAGNOSIS — E2839 Other primary ovarian failure: Secondary | ICD-10-CM | POA: Diagnosis present

## 2022-08-27 DIAGNOSIS — D472 Monoclonal gammopathy: Secondary | ICD-10-CM | POA: Diagnosis present

## 2022-08-27 DIAGNOSIS — M351 Other overlap syndromes: Secondary | ICD-10-CM | POA: Diagnosis present

## 2022-08-27 DIAGNOSIS — K219 Gastro-esophageal reflux disease without esophagitis: Secondary | ICD-10-CM | POA: Diagnosis present

## 2022-08-27 DIAGNOSIS — E8889 Other specified metabolic disorders: Secondary | ICD-10-CM | POA: Diagnosis present

## 2022-08-27 DIAGNOSIS — K529 Noninfective gastroenteritis and colitis, unspecified: Secondary | ICD-10-CM | POA: Diagnosis present

## 2022-08-27 DIAGNOSIS — Z87891 Personal history of nicotine dependence: Secondary | ICD-10-CM | POA: Diagnosis not present

## 2022-08-27 HISTORY — PX: ESOPHAGOGASTRODUODENOSCOPY: SHX5428

## 2022-08-27 HISTORY — PX: BIOPSY: SHX5522

## 2022-08-27 LAB — CBC
HCT: 30.8 % — ABNORMAL LOW (ref 36.0–46.0)
Hemoglobin: 11.1 g/dL — ABNORMAL LOW (ref 12.0–15.0)
MCH: 34.3 pg — ABNORMAL HIGH (ref 26.0–34.0)
MCHC: 36 g/dL (ref 30.0–36.0)
MCV: 95.1 fL (ref 80.0–100.0)
Platelets: 154 10*3/uL (ref 150–400)
RBC: 3.24 MIL/uL — ABNORMAL LOW (ref 3.87–5.11)
RDW: 10.7 % — ABNORMAL LOW (ref 11.5–15.5)
WBC: 2.8 10*3/uL — ABNORMAL LOW (ref 4.0–10.5)
nRBC: 0 % (ref 0.0–0.2)

## 2022-08-27 LAB — COMPREHENSIVE METABOLIC PANEL
ALT: 18 U/L (ref 0–44)
AST: 19 U/L (ref 15–41)
Albumin: 3.3 g/dL — ABNORMAL LOW (ref 3.5–5.0)
Alkaline Phosphatase: 42 U/L (ref 38–126)
Anion gap: 4 — ABNORMAL LOW (ref 5–15)
BUN: 9 mg/dL (ref 6–20)
CO2: 27 mmol/L (ref 22–32)
Calcium: 9 mg/dL (ref 8.9–10.3)
Chloride: 107 mmol/L (ref 98–111)
Creatinine, Ser: 0.71 mg/dL (ref 0.44–1.00)
GFR, Estimated: >60 mL/min (ref >60)
Glucose, Bld: 105 mg/dL — ABNORMAL HIGH (ref 70–99)
Potassium: 3.6 mmol/L (ref 3.5–5.1)
Sodium: 138 mmol/L (ref 135–145)
Total Bilirubin: 1.2 mg/dL (ref 0.3–1.2)
Total Protein: 6.8 g/dL (ref 6.5–8.1)

## 2022-08-27 LAB — HIV ANTIBODY (ROUTINE TESTING W REFLEX): HIV Screen 4th Generation wRfx: NONREACTIVE

## 2022-08-27 SURGERY — EGD (ESOPHAGOGASTRODUODENOSCOPY)
Anesthesia: Monitor Anesthesia Care

## 2022-08-27 MED ORDER — LACTATED RINGERS IV SOLN: INTRAVENOUS | Status: DC | PRN

## 2022-08-27 MED ORDER — MORPHINE SULFATE (PF) 2 MG/ML IV SOLN
2.0000 mg | INTRAVENOUS | Status: DC | PRN
  Administered 2022-08-27: 2 mg via INTRAVENOUS
  Filled 2022-08-27 (×2): qty 1

## 2022-08-27 MED ORDER — SUCRALFATE 1 GM/10ML PO SUSP
1.0000 g | Freq: Three times a day (TID) | ORAL | Status: DC
  Administered 2022-08-27 – 2022-08-28 (×4): 1 g via ORAL
  Filled 2022-08-27 (×5): qty 10

## 2022-08-27 MED ORDER — PROPOFOL 500 MG/50ML IV EMUL
INTRAVENOUS | Status: DC | PRN
  Administered 2022-08-27: 60 mg via INTRAVENOUS
  Administered 2022-08-27: 75 ug/kg/min via INTRAVENOUS

## 2022-08-27 NOTE — Op Note (Addendum)
[facility] Patient Name: Kim Fitzgerald Procedure Date: 08/27/2022 MRN: [id number] Attending MD: Otis Brace , MD Date of Birth: 03-Mar-1976 CSN: [id number] Age: 46 Admit Type: Outpatient Procedure:                Upper GI endoscopy Indications:              Epigastric abdominal pain, Nausea with vomiting Providers:                Otis Brace, MD, Carlyn Reichert, RN, Gean Quint RN, Darliss Cheney, Technician Referring MD:              Medicines:                Sedation Administered by an Anesthesia Professional Complications:            No immediate complications. Estimated Blood Loss:     Estimated blood loss was minimal. Procedure:                Pre-Anesthesia Assessment:                           - Prior to the procedure, a History and Physical                            was performed, and patient medications and                            allergies were reviewed. The patient's tolerance of                            previous anesthesia was also reviewed. The risks                            and benefits of the procedure and the sedation                            options and risks were discussed with the patient.                            All questions were answered, and informed consent                            was obtained. Prior Anticoagulants: The patient has                            taken no previous anticoagulant or antiplatelet                            agents. ASA Grade Assessment: III - A patient with                            severe systemic disease. After reviewing the risks  and benefits, the patient was deemed in                            satisfactory condition to undergo the procedure.                           After obtaining informed consent, the endoscope was                            passed under direct vision. Throughout the                            procedure, the  patient's blood pressure, pulse, and                            oxygen saturations were monitored continuously. The                            [id number]-[id number] ([id number]) Olympus endoscope was introduced                            through the mouth, and advanced to the second part                            of duodenum. The upper GI endoscopy was                            accomplished without difficulty. The patient                            tolerated the procedure well. Scope In: Scope Out: Findings:      LA Grade C (one or more mucosal breaks continuous between tops of 2 or       more mucosal folds, less than 75% circumference) esophagitis with no       bleeding was found in the Mid and distal esophagus. Biopsies were taken       with a cold forceps for histology.      Diffuse moderate inflammation characterized by congestion (edema),       erosions and erythema was found in the entire examined stomach. Biopsies       were taken with a cold forceps for histology.      The cardia and gastric fundus were normal on retroflexion.      The duodenal bulb, first portion of the duodenum and second portion of       the duodenum were normal. Impression:               - LA Grade C erosive esophagitis with no bleeding.                            Biopsied.                           - Chronic gastritis. Biopsied.                           -  Normal duodenal bulb, first portion of the                            duodenum and second portion of the duodenum. Moderate Sedation:      Moderate (conscious) sedation was personally administered by an       anesthesia professional. The following parameters were monitored: oxygen       saturation, heart rate, blood pressure, and response to care. Recommendation:           - Return patient to hospital ward for ongoing care.                           - Resume previous diet.                           - Continue present medications.                           - Await  pathology results.                           - Repeat upper endoscopy in 3 months to check                            healing.                           - Return to GI office in 2 months.                           - No ibuprofen, naproxen, or other non-steroidal                            anti-inflammatory drugs. Procedure Code(s):        --- Professional ---                           [phone], Esophagogastroduodenoscopy, flexible,                            transoral; with biopsy, single or multiple Diagnosis Code(s):        --- Professional ---                           K20.80, Other esophagitis without bleeding                           K29.50, Unspecified chronic gastritis without                            bleeding                           R10.13, Epigastric pain                           R11.2, Nausea with vomiting, unspecified CPT copyright 2019 [employer]. All rights reserved.  The codes documented in this report are preliminary and upon coder review may  be revised to meet current compliance requirements. Otis Brace, MD Otis Brace, MD 08/27/2022 3:33:46 PM Number of Addenda: 0

## 2022-08-27 NOTE — Progress Notes (Signed)
MD Opyd paged because patient is c/o abd pain, 8 out of 10. She says she is having trouble swallowing pills and would IV pain medication.

## 2022-08-27 NOTE — Consult Note (Signed)
Referring Provider: [facility] Primary Care Physician:  Tomasa Hose, NP Primary Gastroenterologist:  Althia Forts (Dr. Barney Drain)  Reason for Consultation:  Nausea and Vomiting  HPI: Kim Fitzgerald is a 46 y.o. female history of MGUS (currently stable last seen at [facility] 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  She reports on Saturday she went to a gathering where she ate a variety of fluid including barbecue, seafood, potato salad and baked beans.  Starting the next day she began to have multiple episodes of emesis at the day, too many to count.  Notes emesis was green, non-bloody.  She began to feel weak and fatigued.  Noted burning sensation in her chest as well as some shortness of breath.  She was unable to tolerate any food or water that would cause her to have further emesis.  She lost all of her appetite.  She was seen in the ED 08/25/2022 for emesis.  She improved after GI cocktail and discharged with omeprazole and Carafate.  He continued to have ongoing nausea and reflux symptoms then presented to the ED again 08/26/2022.  She denies diarrhea, constipation, melena, hematochezia.  She denies fevers and chills.    In the ED, lipase was normal, normal hemoglobin and WBC.  Urine drug screen negative for THC.  Her symptoms did not improve despite Reglan and Pepcid patient was admitted for diet challenge and IV fluids.  GI was consulted.  Patient notes today she has been to have some appetite again and she has been able to tolerate some apple juice well.   She denies previous episodes of nausea and vomiting aside from when she had a small bowel  obstruction. History of cholecystectomy, no history of gastric bypass or sleeve surgery.  She has history of chronic GERD that is well maintained on Dexilant 60 mg once daily.  Rep[place] notes a history of colon polyps. She takes ibuprofen 1 pill once a week for her fibromyalgia. Denies marijuana use.   Past Medical History:  Diagnosis Date   GERD (gastroesophageal reflux disease)    H. pylori infection     treated twice   [place]    Ovarian failure    S/P colonoscopy 2006   [place] w/[place](PAC), 1 polyp-benign   Undifferentiated connective tissue disease [place]    Past Surgical History:  Procedure Laterality Date   APPENDECTOMY     CHOLECYSTECTOMY     cholelithiasis   DILATATION & CURRETTAGE/HYSTEROSCOPY WITH RESECTOCOPE N/A 08/15/2014   Procedure: DILATATION & CURETTAGE, HYSTEROSCOPY WITH myosure;  Surgeon: [place] Staff MD;  [place]  Service: Gynecology;  Laterality: N/A;   ESOPHAGOGASTRODUODENOSCOPY  07/16/2011   mild gastritis   OVARIAN CYST REMOVAL  2010   laproscopic    Prior to Admission medications   Medication Sig Start Date End Date Taking? Authorizing Provider  buPROPion (WELLBUTRIN XL) 150 MG 24 hr tablet Take 150 mg by mouth daily. 07/17/18  Yes [provider]  citalopram (CELEXA) 10 MG tablet Take 10 mg by mouth daily. 11/08/19  Yes [provider]  dicyclomine (BENTYL) 20 MG tablet Take 20 mg by mouth 4 (four) times daily  as needed for spasms. 08/24/22  Yes [provider]  gabapentin (NEURONTIN) 300 MG capsule Take 300 mg by mouth 2 (two) times daily. 06/24/17  Yes [provider]  HYDROcodone-acetaminophen (NORCO/VICODIN) 5-325 MG tablet Take 1 tablet by mouth 2 (two) times daily as needed for pain. 07/24/22  Yes [provider]  hydroxychloroquine (PLAQUENIL) 200 MG tablet Take 200 mg  by mouth 2 (two) times daily.   Yes [provider]  ibuprofen (ADVIL,MOTRIN) 200 MG tablet Take 400 mg by mouth every 6 (six) hours as needed for moderate pain.   Yes [provider]  ondansetron (ZOFRAN-ODT) 4 MG disintegrating tablet Take 1 tablet (4 mg total) by mouth every 8 (eight) hours as needed for nausea or vomiting. 08/25/22  Yes Sponseller, Gypsy Balsam, PA-C  promethazine (PHENERGAN) 25 MG tablet Take 25 mg by mouth every 6 (six) hours as needed for nausea or vomiting. 08/24/22  Yes [provider]  sucralfate (CARAFATE) 1 GM/10ML suspension Take 10 mLs (1 g total) by mouth 3 (three) times daily as needed. Patient taking differently: Take 1 g by mouth 3 (three) times daily as needed (ulcers). 08/25/22  Yes Sponseller, Rebekah R, PA-C  tiZANidine (ZANAFLEX) 4 MG tablet Take 4 mg by mouth at bedtime. 06/25/22  Yes [provider]  zolpidem (AMBIEN) 10 MG tablet Take 10 mg by mouth at bedtime. 05/27/21  Yes [provider]  meclizine (ANTIVERT) 25 MG tablet Take 1 tablet (25 mg total) by mouth 3 (three) times daily as needed for dizziness. Patient not taking: Reported on 07/24/2022 02/05/18   Orpah Greek, MD  methotrexate (RHEUMATREX) 2.5 MG tablet Take 20 mg by mouth once a week. Pt states this medication is currently on hold 01/18/20   [provider]  omeprazole (PRILOSEC) 40 MG capsule Take 1 capsule (40 mg total) by mouth daily. Patient not taking: Reported on 08/26/2022 08/25/22   Sponseller, Eugene Garnet R, PA-C  ondansetron (ZOFRAN) 4 MG tablet Take 1 tablet (4 mg total) by mouth every 6 (six) hours. Patient not taking: Reported on 08/26/2022 07/24/22   Prosperi, Christian H, PA-C  polyethylene glycol [facility]) packet Take 17 g by mouth daily. Patient not taking: Reported on 10/04/2018 04/29/18   Delia Heady, PA-C    Scheduled Meds:  buPROPion  150 mg Oral Daily   citalopram  10 mg Oral Daily   enoxaparin (LOVENOX) injection  40 mg  Subcutaneous Daily   gabapentin  300 mg Oral BID   hydroxychloroquine  200 mg Oral BID   pantoprazole (PROTONIX) IV  40 mg Intravenous Q12H   tiZANidine  4 mg Oral QHS   Continuous Infusions:  dextrose 5 % and 0.9% NaCl 125 mL/hr at 08/27/22 4196   promethazine (PHENERGAN) injection (IM or IVPB) Stopped (08/26/22 2039)   PRN Meds:.acetaminophen **OR** acetaminophen, dicyclomine, HYDROcodone-acetaminophen, ondansetron **OR** ondansetron (ZOFRAN) IV, promethazine (PHENERGAN) injection (IM or IVPB), zolpidem  Allergies as of 08/26/2022   (No Known Allergies)    Family History  Problem Relation Age of Onset   Heart failure Mother    Alcohol abuse Father    Crohn's disease Brother 89    Social History   Socioeconomic History   Marital status: Divorced    Spouse name: Not on file   Number of children: 0   Years of education: Not on file   Highest education level: Not on file  Occupational History   Occupation: property mgmt    Employer: [employer]  Tobacco Use  Smoking status: Former    Packs/day: 1.00    Years: 12.00    Total pack years: 12.00    Types: E-cigarettes, Cigarettes    Quit date: 07/11/2014    Years since quitting: 8.1   Smokeless tobacco: Never  Vaping Use   Vaping Use: Never used  Substance and Sexual Activity   Alcohol use: No   Drug use: No   Sexual activity: Not Currently    Partners: Male  Other Topics Concern   Not on file  Social History Narrative   Lives alone   Social Determinants of Health   Financial Resource Strain: Not on file  Food Insecurity: Food Insecurity Present (08/26/2022)   Hunger Vital Sign    Worried About Running Out of Food in the Last Year: Sometimes true    Ran Out of Food in the Last Year: Never true  Transportation Needs: Unmet Transportation Needs (08/26/2022)   PRAPARE - Hydrologist (Medical): Yes    Lack of Transportation (Non-Medical): No  Physical Activity: Not on file  Stress:  Not on file  Social Connections: Not on file  Intimate Partner Violence: Not At Risk (08/26/2022)   Humiliation, Afraid, Rape, and Kick questionnaire    Fear of Current or Ex-Partner: No    Emotionally Abused: No    Physically Abused: No    Sexually Abused: No    Review of Systems: All negative except as stated above in HPI.  Physical Exam:Physical Exam Constitutional:      General: She is not in acute distress.    Appearance: Normal appearance. She is normal weight.  HENT:     Head: Normocephalic and atraumatic.     Right Ear: External ear normal.     Left Ear: External ear normal.     Nose: Nose normal.     Mouth/Throat:     Mouth: Mucous membranes are moist.  Eyes:     General: No scleral icterus.    Pupils: Pupils are equal, round, and reactive to light.  Cardiovascular:     Rate and Rhythm: Normal rate and regular rhythm.     Pulses: Normal pulses.     Heart sounds: Normal heart sounds.  Pulmonary:     Effort: Pulmonary effort is normal.     Breath sounds: Normal breath sounds.  Abdominal:     General: Abdomen is flat. Bowel sounds are normal. There is no distension.     Palpations: Abdomen is soft. There is no mass.     Tenderness: There is abdominal tenderness (RLQ). There is no guarding or rebound.     Hernia: No hernia is present.  Musculoskeletal:        General: No swelling. Normal range of motion.     Cervical back: Normal range of motion and neck supple.  Skin:    General: Skin is warm and dry.     Coloration: Skin is not jaundiced or pale.  Neurological:     General: No focal deficit present.     Mental Status: She is alert and oriented to person, place, and time. Mental status is at baseline.  Psychiatric:        Mood and Affect: Mood normal.        Behavior: Behavior normal.     Physical Activity: Not on file  . Vital signs: Vitals:   08/27/22 0155 08/27/22 0437  BP: 97/71 109/71  Pulse: 69 69  Resp: 18 16  Temp: 98.6 F (37  C) 98.5 F (36.9  C)  SpO2: 100% 97%   Last BM Date : 08/25/22    GI:  Lab Results: Recent Labs    08/24/22 2224 08/26/22 0849 08/27/22 0503  WBC 4.7 4.4 2.8*  HGB 12.9 13.2 11.1*  HCT 37.1 37.9 30.8*  PLT 224 221 154   BMET Recent Labs    08/25/22 0547 08/26/22 0849 08/27/22 0503  NA 136 135 138  K 3.6 3.8 3.6  CL 102 100 107  CO2 '26 26 27  '$ GLUCOSE 94 94 105*  BUN '14 15 9  '$ CREATININE 0.74 0.75 0.71  CALCIUM 9.4 10.0 9.0   LFT Recent Labs    08/27/22 0503  PROT 6.8  ALBUMIN 3.3*  AST 19  ALT 18  ALKPHOS 42  BILITOT 1.2   PT/INR No results for input(s): "LABPROT", "INR" in the last 72 hours.   Studies/Results: Abd 1 View (KUB)  Result Date: 08/26/2022 CLINICAL DATA:  Nausea and vomiting, abdominal pain for 3 days. EXAM: ABDOMEN - 1 VIEW COMPARISON:  CT 08/24/2022 FINDINGS: The bowel gas pattern is nonobstructive. There is no supine evidence of free air or bowel wall thickening. A moderate amount of stool is present throughout the colon. Gas extends to the rectum. Cholecystectomy clips are noted. The bones appear unremarkable. Numerous telemetry leads overlie the lower chest and abdomen. IMPRESSION: Nonobstructive bowel gas pattern. Moderate stool throughout the colon. Electronically Signed   By: Richardean Sale M.D.   On: 08/26/2022 16:03    Impression: Nausea, vomiting, abdominal pain  CT abdomen and pelvis with contrast 08/24/2022 1. Mild pre pyloric gastric wall thickening, can be seen with gastritis or peptic ulcer disease. 2. Few fluid-filled loops of small bowel in the pelvis that are mildly dilated with some associated small bowel wall thickening with adjacent edema, suggesting enteritis. No bowel obstruction. 3. Moderate volume of formed stool in the colon without colonic inflammation. 4. Stable biliary dilatation post cholecystectomy.    Abdomen 1 view KUB 08/26/2022 Nonobstructive bowel gas pattern, moderate stool throughout the colon  Patient with  constipation seen on imaging.  CT abdomen pelvis suggestive of enteritis, possible infectious due her history of eating potato salad at a recent gathering.  Patient also had findings of prepyloric gastric wall thickening, she has a long history of GERD previously well managed on Dexilant.  Possible gastric ulcer or gastritis.  She continues to have symptoms despite IV Protonix 40 mg twice daily.  Her nausea has also not improved with Zofran or promethazine.  She does seem to be improving slightly as she is not able to tolerate apple juice.  We will plan for EGD to further evaluate nausea and vomiting as well as prepyloric gastric wall thickening seen on CT scan.  Plan: Plan for EGD tomorrow. I thoroughly discussed the procedures to include nature, alternatives, benefits, and risks including but not limited to bleeding, perforation, infection, anesthesia/cardiac and pulmonary complications. Patient provides understanding and gave verbal consent to proceed. Continue Protonix IV '40mg'$  BID  Start Carafate 1 g with meals and at bedtime Continue clear liquid diet NPO at midnight Continue anti-emetics and supportive care as needed. [facility] will follow.     LOS: 0 days   Charlott Rakes  PA-C 08/27/2022, 8:44 AM  Contact #  [phone]

## 2022-08-27 NOTE — Progress Notes (Signed)
PROGRESS NOTE PRINCELLA JASKIEWICZ  FBP:[id number] DOB: 10/10/1976 DOA: 08/26/2022 PCP: Tomasa Hose, NP   Brief Narrative/Hospital Course: 46 year old female with history of MGUS (currently stable last seen 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 [facility] similar symptoms of nausea vomiting abdominal pain, CLD work-up with CT abdomen pelvis CBC, CMP, lipase on 9/5 and fairly unremarkable. She has had bowel movement since then and continues to have nausea vomiting difficulty to tolerate diet presents to the ED. She describes it was burning pain form upper chest to upper abdomen onset 3 days ago after dinner at friend's home-ate too many combination of  food. She had similar milder symptoms when she had partial SBO. Patient otherwise denies any chest pain, shortness of breath, fever,( although had chills) headache, focal weakness, numbness tingling, speech difficulties   In ED: CMP CBC lipase unremarkable UA with ketones 20, LE large, bacteria rare WBC 6-10. Patient still symptomatic despite Reglan IV fluids Pepcid, Maalox so admission was requested for hydration and diet challenge.    Subjective: Seen and examined Reports ongoing difficulty with taking pills that causes nausea: Tolerated clear liquid diet yesterday Waiting for GI evaluation   Assessment and Plan: Principal Problem:   Intractable nausea and vomiting   Intractable nausea and vomiting GERD: CT abdomen pelvis 9/5-enteritis, x-ray abdomen 9/7 nonobstructive bowel gas pattern, moderate stool labs w/ stable CBC CMP  lipase, UDS negative for THC.unclear etiology could be from GERD versus other etiology, wonder if it is related to her Sjogren's syndrome/undifferentiated carotid disease disorder.She does take ibuprofen weekly.  GI input appreciated, planning for EGD.  Continue IV fluids, PPI twice daily, Carafate.    Dehydration/ketosis in urine: Continue IV fluid hydration as above.     Undifferentiated connective tissue disorder Sjogren's syndrome with keratoconjunctivitis sicca Fibromyalgia-chronic pain: [facility]  Patient chronically ON Plaquenil,Cymbalta,and on norco continue home meds MGUS: sees onco q 6 mo and stable  DVT prophylaxis: enoxaparin (LOVENOX) injection 40 mg Start: 08/26/22 1630 Code Status:   Code Status: Full Code Family Communication: plan of care discussed with patient at bedside. Patient status is: Admitted as observation remains hospitalized due to persistent intractable nausea, unable to tolerate diet, needing further GI work-up.  Level of care: Med-Surg  Dispo: The patient is from: home            Anticipated disposition: home Mobility Assessment (last 72 hours)     Mobility Assessment     Row Name 08/26/22 2205 08/26/22 1700         Does patient have an order for bedrest or is patient medically unstable No - Continue assessment No - Continue assessment      What is the highest level of mobility based on the progressive mobility assessment? Level 5 (Walks with assist in room/hall) - Balance while stepping forward/back and can walk in room with assist - Complete Level 5 (Walks with assist in room/hall) - Balance while stepping forward/back and can walk in room with assist - Complete                Objective: Vitals last 24 hrs: Vitals:   08/26/22 1650 08/26/22  2038 08/27/22 0155 08/27/22 0437  BP: 120/86 129/75 97/71 109/71  Pulse: 73 71 69 69  Resp: '16 16 18 16  '$ Temp: 98.5 F (36.9 C) 98.6 F (37 C) 98.6 F (37  C) 98.5 F (36.9 C)  TempSrc: Oral Oral Oral Oral  SpO2: 100% 100% 100% 97%  Weight:      Height:       Weight change:   Physical Examination: General exam: alert awake,older than stated age, weak appearing. HEENT:Oral mucosa moist, Ear/Nose WNL grossly, dentition normal. Respiratory system: bilaterally diminished BS, no use of accessory muscle Cardiovascular system: S1 & S2 +, No JVD. Gastrointestinal system: Abdomen soft,NT,ND, BS+ Nervous System:Alert, awake, moving extremities and grossly nonfocal Extremities: LE edema neg,distal peripheral pulses palpable.  Skin: No rashes,no icterus. MSK: Normal muscle bulk,tone, power  Medications reviewed:  Scheduled Meds:  buPROPion  150 mg Oral Daily   citalopram  10 mg Oral Daily   enoxaparin (LOVENOX) injection  40 mg Subcutaneous Daily   gabapentin  300 mg Oral BID   hydroxychloroquine  200 mg Oral BID   pantoprazole (PROTONIX) IV  40 mg Intravenous Q12H   sucralfate  1 g Oral TID WC & HS   tiZANidine  4 mg Oral QHS   Continuous Infusions:  dextrose 5 % and 0.9% NaCl 125 mL/hr at 08/27/22 0838   promethazine (PHENERGAN) injection (IM or IVPB) Stopped (08/26/22 2039)      Diet Order             Diet NPO time specified  Diet effective now                            Intake/Output Summary (Last 24 hours) at 08/27/2022 1243 Last data filed at 08/27/2022 1000 Gross per 24 hour  Intake 2744.93 ml  Output 0 ml  Net 2744.93 ml   Net IO Since Admission: 2,744.93 mL [08/27/22 1243]  Wt Readings from Last 3 Encounters:  08/26/22 72 kg  08/24/22 72 kg  07/24/22 72.1 kg     Unresulted Labs (From admission, onward)     Start     Ordered   09/02/22 0500  Creatinine, serum  (enoxaparin (LOVENOX)    CrCl >/= 30 ml/min)  Weekly,   R     Comments: while on enoxaparin therapy    08/26/22 1522          Data Reviewed: I have personally reviewed following labs and imaging studies CBC: Recent Labs  Lab  08/24/22 2224 08/26/22 0849 08/27/22 0503  WBC 4.7 4.4 2.8*  NEUTROABS 2.7  --   --   HGB 12.9 13.2 11.1*  HCT 37.1 37.9 30.8*  MCV 97.1 94.5 95.1  PLT 224 221 433   Basic Metabolic Panel: Recent Labs  Lab 08/25/22 0547 08/26/22 0849 08/27/22 0503  NA 136 135 138  K 3.6 3.8 3.6  CL 102 100 107  CO2 '26 26 27  '$ GLUCOSE 94 94 105*  BUN '14 15 9  '$ CREATININE 0.74 0.75 0.71  CALCIUM 9.4 10.0 9.0   GFR: Estimated Creatinine Clearance: 87.4 mL/min (by C-G formula based on SCr of 0.71 mg/dL). Liver Function Tests: Recent Labs  Lab 08/25/22 0547 08/26/22 0849 08/27/22 0503  AST 33 27 19  ALT '31 27 18  '$ ALKPHOS 59 61 42  BILITOT 1.7* 1.8* 1.2  PROT 9.0* 9.1* 6.8  ALBUMIN 4.4 4.8 3.3*   Recent Labs  Lab 08/25/22 0547 08/26/22  0849  LIPASE 42 45   No results for input(s): "AMMONIA" in the last 168 hours. Coagulation Profile: No results for input(s): "INR", "PROTIME" in the last 168 hours. BNP (last 3 results) No results for input(s): "PROBNP" in the last 8760 hours. HbA1C: No results for input(s): "HGBA1C" in the last 72 hours. CBG: No results for input(s): "GLUCAP" in the last 168 hours. Lipid Profile: No results for input(s): "CHOL", "HDL", "LDLCALC", "TRIG", "CHOLHDL", "LDLDIRECT" in the last 72 hours. Thyroid Function Tests: No results for input(s): "TSH", "T4TOTAL", "FREET4", "T3FREE", "THYROIDAB" in the last 72 hours. Sepsis Labs: No results for input(s): "PROCALCITON", "LATICACIDVEN" in the last 168 hours.  No results found for this or any previous visit (from the past 240 hour(s)).  Antimicrobials: Anti-infectives (From admission, onward)    Start     Dose/Rate Route Frequency Ordered Stop   08/26/22 1530  hydroxychloroquine (PLAQUENIL) tablet 200 mg        200 mg Oral 2 times daily 08/26/22 1523        Culture/Microbiology Radiology Studies: Abd 1 View (KUB)  Result Date: 08/26/2022 CLINICAL DATA:  Nausea and vomiting, abdominal pain for 3 days.  EXAM: ABDOMEN - 1 VIEW COMPARISON:  CT 08/24/2022 FINDINGS: The bowel gas pattern is nonobstructive. There is no supine evidence of free air or bowel wall thickening. A moderate amount of stool is present throughout the colon. Gas extends to the rectum. Cholecystectomy clips are noted. The bones appear unremarkable. Numerous telemetry leads overlie the lower chest and abdomen. IMPRESSION: Nonobstructive bowel gas pattern. Moderate stool throughout the colon. Electronically Signed   By: Richardean Sale M.D.   On: 08/26/2022 16:03     LOS: 0 days   Antonieta Pert, MD [facility]  08/27/2022, 12:43 PM

## 2022-08-27 NOTE — Anesthesia Preprocedure Evaluation (Addendum)
Anesthesia Evaluation  Patient identified by MRN, date of birth, ID band Patient awake    Reviewed: Allergy & Precautions, NPO status , Patient's Chart, lab work & pertinent test results  Airway Mallampati: II  TM Distance: >3 FB Neck ROM: Full    Dental no notable dental hx. (+) Poor Dentition, Dental Advisory Given, Missing, Chipped,    Pulmonary neg pulmonary ROS, former smoker,    Pulmonary exam normal breath sounds clear to auscultation       Cardiovascular negative cardio ROS Normal cardiovascular exam Rhythm:Regular Rate:Normal     Neuro/Psych negative neurological ROS  negative psych ROS   GI/Hepatic negative GI ROS, Neg liver ROS,   Endo/Other  negative endocrine ROS  Renal/GU negative Renal ROS  negative genitourinary   Musculoskeletal negative musculoskeletal ROS (+)   Abdominal   Peds  Hematology negative hematology ROS (+)   Anesthesia Other Findings   Reproductive/Obstetrics                            Anesthesia Physical Anesthesia Plan  ASA: 3  Anesthesia Plan: MAC   Post-op Pain Management:    Induction: Intravenous  PONV Risk Score and Plan: Propofol infusion and Treatment may vary due to age or medical condition  Airway Management Planned: Natural Airway  Additional Equipment:   Intra-op Plan:   Post-operative Plan:   Informed Consent: I have reviewed the patients History and Physical, chart, labs and discussed the procedure including the risks, benefits and alternatives for the proposed anesthesia with the patient or authorized representative who has indicated his/her understanding and acceptance.     Dental advisory given  Plan Discussed with: CRNA  Anesthesia Plan Comments:         Anesthesia Quick Evaluation

## 2022-08-27 NOTE — Transfer of Care (Signed)
Immediate Anesthesia Transfer of Care Note  Patient: Kim Fitzgerald  Procedure(s) Performed: Procedure(s): ESOPHAGOGASTRODUODENOSCOPY (EGD) (N/A) BIOPSY  Patient Location: PACU and Endoscopy Unit  Anesthesia Type:MAC  Level of Consciousness: awake, alert  and oriented  Airway & Oxygen Therapy: Patient Spontanous Breathing and Patient connected to nasal cannula oxygen  Post-op Assessment: Report given to RN and Post -op Vital signs reviewed and stable  Post vital signs: Reviewed and stable  Last Vitals:  Vitals:   08/27/22 1351 08/27/22 1401  BP: 120/78 122/81  Pulse: 75 76  Resp: 18 10  Temp: 37.1 C 36.8 C  SpO2:  009%    Complications: No apparent anesthesia complications

## 2022-08-27 NOTE — Brief Op Note (Signed)
08/26/2022 - 08/27/2022  3:34 PM  PATIENT:  Kim Fitzgerald  46 y.o. female  PRE-OPERATIVE DIAGNOSIS:  Nausea, vomiting  POST-OPERATIVE DIAGNOSIS:  esophagitis  PROCEDURE:  Procedure(s): ESOPHAGOGASTRODUODENOSCOPY (EGD) (N/A) BIOPSY  SURGEON:  Surgeon(s) and Role:    * Elin Seats, MD - Primary  Findings ------------ -EGD showed LA grade C erosive esophagitis and gastritis.  Biopsies taken.  Recommendations ------------------------ -Recommend Protonix 40 mg twice a day for 4 weeks followed by Protonix 40 mg once a day until repeat EGD is done to document healing of esophagitis. -Repeat EGD in 2 to 3 months to document healing -Carafate 1 g twice daily for 2 weeks -Avoid NSAID, smoking and alcohol use. -No further inpatient GI work-up planned.  GI will sign off.  Call us back if needed -Follow-up in GI clinic in 2 months after discharge  Otis Brace MD, [place] 08/27/2022, 3:35 PM  Contact #  [phone]

## 2022-08-28 ENCOUNTER — Encounter (HOSPITAL_COMMUNITY): Payer: Self-pay | Admitting: Gastroenterology

## 2022-08-28 DIAGNOSIS — K2970 Gastritis, unspecified, without bleeding: Secondary | ICD-10-CM

## 2022-08-28 DIAGNOSIS — K2210 Ulcer of esophagus without bleeding: Secondary | ICD-10-CM

## 2022-08-28 MED ORDER — MORPHINE SULFATE (PF) 2 MG/ML IV SOLN
2.0000 mg | Freq: Once | INTRAVENOUS | Status: AC | PRN
  Administered 2022-08-28: 2 mg via INTRAVENOUS

## 2022-08-28 MED ORDER — SUCRALFATE 1 GM/10ML PO SUSP: 1.0000 g | Freq: Two times a day (BID) | ORAL | 0 refills | Status: AC

## 2022-08-28 MED ORDER — PANTOPRAZOLE SODIUM 40 MG PO TBEC
40.0000 mg | DELAYED_RELEASE_TABLET | Freq: Two times a day (BID) | ORAL | Status: DC
  Administered 2022-08-28: 40 mg via ORAL
  Filled 2022-08-28: qty 1

## 2022-08-28 MED ORDER — PANTOPRAZOLE SODIUM 40 MG PO TBEC: DELAYED_RELEASE_TABLET | ORAL | 0 refills | Status: DC

## 2022-08-28 NOTE — Progress Notes (Signed)
Assessment unchanged. Pt's pain decreased. Ready for dc. Pt verbalized understanding of dc instructions through teach back including medications and follow up care. Discharged via wc to front entrance by NT.

## 2022-08-28 NOTE — Plan of Care (Signed)

## 2022-08-28 NOTE — Discharge Summary (Signed)
Physician Discharge Summary  Kim Fitzgerald ZDG:[id number] DOB: [birth year] DOA: 08/26/2022  PCP: Tomasa Hose, NP  Admit date: 08/26/2022 Discharge date: 08/28/2022 Recommendations for Outpatient Follow-up:  Follow up with PCP in 1 weeks-call for appointment Follow-up with gastroenterology for repeat endoscopy in 2 to 3 months Please obtain BMP/CBC in one week  Discharge Dispo: HOME Discharge Condition: Stable Code Status:   Code Status: Full Code Diet recommendation:  Diet Order             DIET SOFT Room service appropriate? 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 [facility] similar symptoms of nausea vomiting abdominal pain, CLD work-up with CT abdomen pelvis CBC, CMP, lipase on 9/5 and fairly unremarkable. She has had bowel movement since then and continues to have nausea vomiting difficulty to tolerate diet presents to the ED. She describes it was burning pain form upper chest to upper abdomen onset 3 days ago after dinner at friend's home-ate too many combination of  food. She had similar milder symptoms when she had partial SBO. Patient otherwise denies any chest pain, shortness of breath, fever,( although had chills) headache, focal weakness, numbness tingling, speech difficulties   In ED: CMP CBC lipase unremarkable UA with ketones 20, LE large, bacteria rare WBC 6-10. Patient still symptomatic despite  Reglan IV fluids Pepcid, Maalox. Patient was admitted, conservatively treated, seen by GI underwent EGD "EGD showed LA grade C erosive esophagitis and gastritis.  Biopsies taken"  GI Recommendations -Recommend Protonix 40 mg twice a day for 4 weeks followed by Protonix 40 mg once a day until repeat EGD is done to document healing of esophagitis. -Repeat EGD in 2 to 3 months to document healing -Carafate 1 g twice daily for 2 weeks -Avoid NSAID, smoking and alcohol use. -No further inpatient GI work-up planned. Follow-up in GI clinic in 2 months after discharge"   Discharge Diagnoses:  Principal Problem:   Erosive esophagitis Active Problems:   Intractable nausea and vomiting   Gastritis  Erosive esophagitis Gastritis Intractable nausea and vomiting GERD: CT abdomen pelvis 9/5-enteritis, x-ray abdomen 9/7 nonobstructive bowel gas pattern, moderate stool labs w/ stable CBC CMP lipase, UDS negative for THC. Patient was admitted, conservatively treated, seen by GI underwent EGD "EGD showed LA grade C erosive esophagitis and gastritis.  Biopsies taken"  GI Recommendations -Recommend Protonix 40 mg twice a day for 4 weeks followed by Protonix 40 mg once a day until repeat EGD is done to document healing of esophagitis. -Repeat EGD in 2 to 3 months to document healing -Carafate 1 g twice daily for 2 weeks -Avoid NSAID, smoking and alcohol use. -No further inpatient GI work-up planned. Follow-up in GI clinic in 2 months after discharge   Dehydration/ketosis in urine: Encourage oral hydration at home    Undifferentiated connective tissue disorder Sjogren's syndrome with keratoconjunctivitis sicca Fibromyalgia-chronic pain: [facility]  Patient chronically ON Plaquenil,Cymbalta,and on norco continue home  meds MGUS: sees onco q 6 mo and stable   Consults: Gastroenterology Subjective: Alert awake oriented she feels ready for discharge  home today  Discharge Exam: Vitals:   08/27/22 2108 08/28/22 0349  BP: 108/72 116/72  Pulse: 73 75  Resp: 18 18  Temp: 98.6 F (37 C) 98.7 F (37.1 C)  SpO2: 93% 97%   General: Pt is alert, awake, not in acute distress Cardiovascular: RRR, S1/S2 +, no rubs, no gallops Respiratory: CTA bilaterally, no wheezing, no rhonchi Abdominal: Soft, NT, ND, bowel sounds + Extremities: no edema, no cyanosis  Discharge Instructions  Discharge Instructions     Discharge instructions   Complete by: As directed    -Recommend Protonix 40 mg twice a day for 4 weeks followed by Protonix 40 mg once a day until repeat EGD is done to document healing of esophagitis. -Repeat EGD in 2 to 3 months to document healing -Carafate 1 g twice daily for 2 weeks -Avoid NSAID, smoking and alcohol use. -No further inpatient GI work-up planned. Follow-up in GI clinic in 2 months after discharge"   Please call call MD or return to ER for similar or worsening recurring problem that brought you to hospital or if any fever,nausea/vomiting,abdominal pain, uncontrolled pain, chest pain,  shortness of breath or any other alarming symptoms.  Please follow-up your doctor as instructed in a week time and call the office for appointment.  Please avoid alcohol, smoking, or any other illicit substance and maintain healthy habits including taking your regular medications as prescribed.  You were cared for by a hospitalist during your hospital stay. If you have any questions about your discharge medications or the care you received while you were in the hospital after you are discharged, you can call the unit and ask to speak with the hospitalist on call if the hospitalist that took care of you is not available.  Once you are discharged, your primary care physician will handle any further medical issues. Please note that NO REFILLS for any discharge medications will be authorized once you are discharged, as it is imperative  that you return to your primary care physician (or establish a relationship with a primary care physician if you do not have one) for your aftercare needs so that they can reassess your need for medications and monitor your lab values   Increase activity slowly   Complete by: As directed       Allergies as of 08/28/2022   No Known Allergies      Medication List     STOP taking these medications    ibuprofen 200 MG tablet Commonly known as: ADVIL   omeprazole 40 MG capsule Commonly known as: PRILOSEC   ondansetron 4 MG tablet Commonly known as: ZOFRAN       TAKE these medications    buPROPion 150 MG 24 hr tablet Commonly known as: WELLBUTRIN XL Take 150 mg by mouth daily.   citalopram 10 MG tablet Commonly known as: CELEXA Take 10 mg by mouth daily.   dicyclomine 20 MG tablet Commonly known as: BENTYL Take 20 mg by mouth 4 (four) times daily as needed for spasms.   gabapentin 300 MG capsule Commonly known as: NEURONTIN Take 300 mg by mouth 2 (two) times daily.   HYDROcodone-acetaminophen 5-325 MG tablet Commonly known as: NORCO/VICODIN Take 1 tablet by mouth 2 (two) times daily as needed for pain.   hydroxychloroquine 200 MG tablet Commonly known as: PLAQUENIL Take 200 mg by mouth 2 (  two) times daily.   meclizine 25 MG tablet Commonly known as: ANTIVERT Take 1 tablet (25 mg total) by mouth 3 (three) times daily as needed for dizziness.   methotrexate 2.5 MG tablet Commonly known as: RHEUMATREX Take 20 mg by mouth once a week. Pt states this medication is currently on hold   ondansetron 4 MG disintegrating tablet Commonly known as: ZOFRAN-ODT Take 1 tablet (4 mg total) by mouth every 8 (eight) hours as needed for nausea or vomiting.   pantoprazole 40 MG tablet Commonly known as: PROTONIX Bid x 4 wk then daily   polyethylene glycol 17 g packet Commonly known as: MiraLax Take 17 g by mouth daily.   promethazine 25 MG tablet Commonly known as:  PHENERGAN Take 25 mg by mouth every 6 (six) hours as needed for nausea or vomiting.   sucralfate 1 GM/10ML suspension Commonly known as: Carafate Take 10 mLs (1 g total) by mouth 2 (two) times daily for 14 days. What changed:  when to take this reasons to take this   tiZANidine 4 MG tablet Commonly known as: ZANAFLEX Take 4 mg by mouth at bedtime.   zolpidem 10 MG tablet Commonly known as: AMBIEN Take 10 mg by mouth at bedtime.        Follow-up Information     Gastroenterology, [facility] [place]. Schedule an appointment as soon as possible for a visit in 2 month(s).   Why: Follow-up for esophagitis Contact information: [address] [place] [address] [phone]         Fonnie Jarvis A, NP Follow up in 1 week(s).   Specialty: Nurse Practitioner Contact information: [place] [id number] [phone]                No Known Allergies  The results of significant diagnostics from this hospitalization (including imaging, microbiology, ancillary and laboratory) are listed below for reference.    Microbiology: No results found for this or any previous visit (from the past 240 hour(s)).  Procedures/Studies: Abd 1 View (KUB)  Result Date: 08/26/2022 CLINICAL DATA:  Nausea and vomiting, abdominal pain for 3 days. EXAM: ABDOMEN - 1 VIEW COMPARISON:  CT 08/24/2022 FINDINGS: The bowel gas pattern is nonobstructive. There is no supine evidence of free air or bowel wall thickening. A moderate amount of stool is present throughout the colon. Gas extends to the rectum. Cholecystectomy clips are noted. The bones appear unremarkable. Numerous telemetry leads overlie the lower chest and abdomen. IMPRESSION: Nonobstructive bowel gas pattern. Moderate stool throughout the colon. Electronically Signed   By: Richardean Sale M.D.   On: 08/26/2022 16:03   DG Chest 2 View  Result Date: 08/25/2022 CLINICAL DATA:  Chest pressure. Mid upper abdominal  pain, cough and congestion and shortness of breath for 3 days. EXAM: CHEST - 2 VIEW COMPARISON:  07/24/2022 FINDINGS: The heart size and mediastinal contours are within normal limits. Both lungs are clear. The visualized skeletal structures are unremarkable. IMPRESSION: No active cardiopulmonary disease. Electronically Signed   By: Kerby Moors M.D.   On: 08/25/2022 07:07   CT ABDOMEN PELVIS W CONTRAST  Result Date: 08/24/2022 CLINICAL DATA:  Bowel obstruction suspected Nausea and vomiting. EXAM: CT ABDOMEN AND PELVIS WITH CONTRAST TECHNIQUE: Multidetector CT imaging of the abdomen and pelvis was performed using the standard protocol following bolus administration of intravenous contrast. RADIATION DOSE REDUCTION: This exam was performed according to the departmental dose-optimization program which includes automated exposure control, adjustment of the mA  and/or kV according to patient size and/or use of iterative reconstruction technique. CONTRAST:  126m OMNIPAQUE IOHEXOL 300 MG/ML  SOLN COMPARISON:  CT 01/21/2019 FINDINGS: Lower chest: No basilar airspace disease or pleural effusion. Hepatobiliary: Small subcentimeter lesion in the left hepatic lobe is too small to characterize but likely small cyst or hemangioma. No specific imaging follow-up is recommended. Post cholecystectomy. There is stable biliary dilatation post cholecystectomy with common bile duct measuring 12 mm at the porta hepatis. No visualized choledocholithiasis. There is similar central intrahepatic biliary prominence. Pancreas: No ductal dilatation or inflammation. Spleen: Normal in size without focal abnormality. Adrenals/Urinary Tract: Normal adrenal glands. No hydronephrosis or focal renal abnormality. No renal stone. Unremarkable urinary bladder. Stomach/Bowel: Mild pre pyloric gastric wall thickening, series 2, image 27. There are a few fluid-filled loops of small bowel in the pelvis that are mildly dilated. There is some associated  small bowel wall thickening involving bowel loops in the deep pelvis with adjacent edema. No transition point or bowel obstruction. Appendectomy per history. There is a moderate volume of formed stool in the colon without colonic inflammation. Vascular/Lymphatic: Aortic atherosclerosis without aneurysm. Patent portal and splenic veins. No abdominopelvic adenopathy. Reproductive: Uterus and bilateral adnexa are unremarkable. Other: No ascites or free air. Musculoskeletal: There are no acute or suspicious osseous abnormalities. IMPRESSION: 1. Mild pre pyloric gastric wall thickening, can be seen with gastritis or peptic ulcer disease. 2. Few fluid-filled loops of small bowel in the pelvis that are mildly dilated with some associated small bowel wall thickening with adjacent edema, suggesting enteritis. No transition point or bowel obstruction. 3. Moderate volume of formed stool in the colon without colonic inflammation. 4. Stable biliary dilatation post cholecystectomy. Recommend correlation with LFTs. If normal, no further evaluation is needed. If LFTs are elevated, consider further assessment with MRCP. Aortic Atherosclerosis (ICD10-I70.0). Electronically Signed   By: MKeith RakeM.D.   On: 08/24/2022 23:50    Labs: BNP (last 3 results) No results for input(s): "BNP" in the last 8760 hours. Basic Metabolic Panel: Recent Labs  Lab 08/25/22 0547 08/26/22 0849 08/27/22 0503  NA 136 135 138  K 3.6 3.8 3.6  CL 102 100 107  CO2 '26 26 27  '$ GLUCOSE 94 94 105*  BUN '14 15 9  '$ CREATININE 0.74 0.75 0.71  CALCIUM 9.4 10.0 9.0   Liver Function Tests: Recent Labs  Lab 08/25/22 0547 08/26/22 0849 08/27/22 0503  AST 33 27 19  ALT '31 27 18  '$ ALKPHOS 59 61 42  BILITOT 1.7* 1.8* 1.2  PROT 9.0* 9.1* 6.8  ALBUMIN 4.4 4.8 3.3*   Recent Labs  Lab 08/25/22 0547 08/26/22 0849  LIPASE 42 45   No results for input(s): "AMMONIA" in the last 168 hours. CBC: Recent Labs  Lab 08/24/22 2224  08/26/22 0849 08/27/22 0503  WBC 4.7 4.4 2.8*  NEUTROABS 2.7  --   --   HGB 12.9 13.2 11.1*  HCT 37.1 37.9 30.8*  MCV 97.1 94.5 95.1  PLT 224 221 154   Cardiac Enzymes: No results for input(s): "CKTOTAL", "CKMB", "CKMBINDEX", "TROPONINI" in the last 168 hours. BNP: Invalid input(s): "POCBNP" CBG: No results for input(s): "GLUCAP" in the last 168 hours. D-Dimer No results for input(s): "DDIMER" in the last 72 hours. Hgb A1c No results for input(s): "HGBA1C" in the last 72 hours. Lipid Profile No results for input(s): "CHOL", "HDL", "LDLCALC", "TRIG", "CHOLHDL", "LDLDIRECT" in the last 72 hours. Thyroid function studies No results for input(s): "TSH", "T4TOTAL", "T3FREE", "THYROIDAB"  in the last 72 hours.  Invalid input(s): "FREET3" Anemia work up No results for input(s): "VITAMINB12", "FOLATE", "FERRITIN", "TIBC", "IRON", "RETICCTPCT" in the last 72 hours. Urinalysis    Component Value Date/Time   COLORURINE YELLOW 08/26/2022 1139   APPEARANCEUR CLEAR 08/26/2022 1139   LABSPEC 1.014 08/26/2022 1139   PHURINE 6.0 08/26/2022 1139   GLUCOSEU NEGATIVE 08/26/2022 1139   HGBUR NEGATIVE 08/26/2022 1139   BILIRUBINUR NEGATIVE 08/26/2022 1139   KETONESUR 20 (A) 08/26/2022 1139   PROTEINUR NEGATIVE 08/26/2022 1139   UROBILINOGEN 0.2 04/10/2013 1300   NITRITE NEGATIVE 08/26/2022 1139   LEUKOCYTESUR LARGE (A) 08/26/2022 1139   Sepsis Labs Recent Labs  Lab 08/24/22 2224 08/26/22 0849 08/27/22 0503  WBC 4.7 4.4 2.8*   Microbiology No results found for this or any previous visit (from the past 240 hour(s)).   Time coordinating discharge: 25 minutes  SIGNED: Antonieta Pert, MD  [facility] 08/28/2022, 10:58 AM  If 7PM-7AM, please contact night-coverage [email or web address]

## 2022-08-29 NOTE — Anesthesia Postprocedure Evaluation (Signed)
Anesthesia Post Note  Patient: Kim Fitzgerald  Procedure(s) Performed: ESOPHAGOGASTRODUODENOSCOPY (EGD) BIOPSY     Patient location during evaluation: Endoscopy Anesthesia Type: MAC Level of consciousness: awake and alert Pain management: pain level controlled Vital Signs Assessment: post-procedure vital signs reviewed and stable Respiratory status: spontaneous breathing, nonlabored ventilation, respiratory function stable and patient connected to nasal cannula oxygen Cardiovascular status: blood pressure returned to baseline and stable Postop Assessment: no apparent nausea or vomiting Anesthetic complications: no   No notable events documented.  Last Vitals:  Vitals:   08/27/22 2108 08/28/22 0349  BP: 108/72 116/72  Pulse: 73 75  Resp: 18 18  Temp: 37 C 37.1 C  SpO2: 93% 97%    Last Pain:  Vitals:   08/28/22 1120  TempSrc:   PainSc: 5                  Delan Ksiazek L Yarelly Kuba

## 2022-08-31 LAB — SURGICAL PATHOLOGY

## 2022-11-02 ENCOUNTER — Emergency Department (HOSPITAL_COMMUNITY)
Admission: EM | Admit: 2022-11-02 | Discharge: 2022-11-03 | Disposition: A | Discharge status: Home / Self Care | Payer: Medicare Other | Attending: Emergency Medicine | Admitting: Emergency Medicine

## 2022-11-02 ENCOUNTER — Encounter (HOSPITAL_COMMUNITY): Payer: Self-pay

## 2022-11-02 ENCOUNTER — Emergency Department (HOSPITAL_COMMUNITY): Payer: Medicare Other

## 2022-11-02 DIAGNOSIS — U071 COVID-19: Secondary | ICD-10-CM | POA: Insufficient documentation

## 2022-11-02 DIAGNOSIS — R112 Nausea with vomiting, unspecified: Secondary | ICD-10-CM | POA: Diagnosis present

## 2022-11-02 LAB — COMPREHENSIVE METABOLIC PANEL
ALT: 36 U/L (ref 0–44)
AST: 31 U/L (ref 15–41)
Albumin: 4.4 g/dL (ref 3.5–5.0)
Alkaline Phosphatase: 66 U/L (ref 38–126)
Anion gap: 7 (ref 5–15)
BUN: 11 mg/dL (ref 6–20)
CO2: 29 mmol/L (ref 22–32)
Calcium: 9.5 mg/dL (ref 8.9–10.3)
Chloride: 102 mmol/L (ref 98–111)
Creatinine, Ser: 0.86 mg/dL (ref 0.44–1.00)
GFR, Estimated: >60 mL/min (ref >60)
Glucose, Bld: 124 mg/dL — ABNORMAL HIGH (ref 70–99)
Potassium: 3.3 mmol/L — ABNORMAL LOW (ref 3.5–5.1)
Sodium: 138 mmol/L (ref 135–145)
Total Bilirubin: 1.4 mg/dL — ABNORMAL HIGH (ref 0.3–1.2)
Total Protein: 8.4 g/dL — ABNORMAL HIGH (ref 6.5–8.1)

## 2022-11-02 LAB — LIPASE, BLOOD: Lipase: 50 U/L (ref 11–51)

## 2022-11-02 LAB — CBC WITH DIFFERENTIAL/PLATELET
Abs Immature Granulocytes: 0 10*3/uL (ref 0.00–0.07)
Basophils Absolute: 0 10*3/uL (ref 0.0–0.1)
Basophils Relative: 0 %
Eosinophils Absolute: 0 10*3/uL (ref 0.0–0.5)
Eosinophils Relative: 1 %
HCT: 36.1 % (ref 36.0–46.0)
Hemoglobin: 12.2 g/dL (ref 12.0–15.0)
Immature Granulocytes: 0 %
Lymphocytes Relative: 38 %
Lymphs Abs: 1.3 10*3/uL (ref 0.7–4.0)
MCH: 33.2 pg (ref 26.0–34.0)
MCHC: 33.8 g/dL (ref 30.0–36.0)
MCV: 98.1 fL (ref 80.0–100.0)
Monocytes Absolute: 0.2 10*3/uL (ref 0.1–1.0)
Monocytes Relative: 7 %
Neutro Abs: 1.7 10*3/uL (ref 1.7–7.7)
Neutrophils Relative %: 54 %
Platelets: 171 10*3/uL (ref 150–400)
RBC: 3.68 MIL/uL — ABNORMAL LOW (ref 3.87–5.11)
RDW: 10.6 % — ABNORMAL LOW (ref 11.5–15.5)
WBC: 3.3 10*3/uL — ABNORMAL LOW (ref 4.0–10.5)
nRBC: 0 % (ref 0.0–0.2)

## 2022-11-02 LAB — TROPONIN I (HIGH SENSITIVITY): Troponin I (High Sensitivity): 3 ng/L (ref <18)

## 2022-11-02 NOTE — ED Provider Triage Note (Signed)
Emergency Medicine Provider Triage Evaluation Note  TABETHA HARAWAY , a 46 y.o. female  was evaluated in triage.  Pt complains of nausea, vomiting, and abdominal pain for the past few days.  Patient also admits to "chest congestion".  Recently admitted for nausea and vomiting in September.  Denies marijuana use.  Review of Systems  Positive: N/V Negative: fever  Physical Exam  BP 106/84   Pulse 78   Temp 98 F (36.7 C) (Oral)   Resp 13   LMP  (LMP Unknown) Comment: ovarian failure  SpO2 100%  Gen:   Awake, no distress   Resp:  Normal effort  MSK:   Moves extremities without difficulty  Other:    Medical Decision Making  Medically screening exam initiated at 4:11 PM.  Appropriate orders placed.  ALOIS MINCER was informed that the remainder of the evaluation will be completed by another provider, this initial triage assessment does not replace that evaluation, and the importance of remaining in the ED until their evaluation is complete.  Labs COVID/influenza CXR EKG   Suzy Bouchard, [place] 11/02/22 1612

## 2022-11-02 NOTE — ED Triage Notes (Signed)
Pt arrived via POV, c/o nausea, dizziness, SOB.

## 2022-11-03 LAB — URINALYSIS, ROUTINE W REFLEX MICROSCOPIC
Bilirubin Urine: NEGATIVE
Glucose, UA: NEGATIVE mg/dL
Hgb urine dipstick: NEGATIVE
Ketones, ur: NEGATIVE mg/dL
Leukocytes,Ua: NEGATIVE
Nitrite: NEGATIVE
Protein, ur: NEGATIVE mg/dL
Specific Gravity, Urine: 1.027 (ref 1.005–1.030)
pH: 5 (ref 5.0–8.0)

## 2022-11-03 LAB — RESP PANEL BY RT-PCR (FLU A&B, COVID) ARPGX2
Influenza A by PCR: NEGATIVE
Influenza B by PCR: NEGATIVE
SARS Coronavirus 2 by RT PCR: POSITIVE — AB

## 2022-11-03 MED ORDER — POTASSIUM CHLORIDE CRYS ER 20 MEQ PO TBCR
40.0000 meq | EXTENDED_RELEASE_TABLET | Freq: Once | ORAL | Status: AC
  Administered 2022-11-03: 40 meq via ORAL
  Filled 2022-11-03: qty 2

## 2022-11-03 MED ORDER — ACETAMINOPHEN 500 MG PO TABS
1000.0000 mg | ORAL_TABLET | Freq: Once | ORAL | Status: AC
  Administered 2022-11-03: 1000 mg via ORAL
  Filled 2022-11-03: qty 2

## 2022-11-03 MED ORDER — NIRMATRELVIR/RITONAVIR (PAXLOVID)TABLET: 3.0000 | ORAL_TABLET | Freq: Two times a day (BID) | ORAL | 0 refills | Status: AC

## 2022-11-03 MED ORDER — ONDANSETRON HCL 4 MG/2ML IJ SOLN
4.0000 mg | Freq: Once | INTRAMUSCULAR | Status: AC
  Administered 2022-11-03: 4 mg via INTRAVENOUS
  Filled 2022-11-03: qty 2

## 2022-11-03 MED ORDER — SODIUM CHLORIDE 0.9 % IV BOLUS
1000.0000 mL | Freq: Once | INTRAVENOUS | Status: AC
  Administered 2022-11-03: 1000 mL via INTRAVENOUS

## 2022-11-03 MED ORDER — ONDANSETRON 4 MG PO TBDP: 4.0000 mg | ORAL_TABLET | Freq: Three times a day (TID) | ORAL | 0 refills | Status: DC | PRN

## 2022-11-03 NOTE — ED Provider Notes (Signed)
[facility] Emergency Department Provider Note MRN:  [id number]  Arrival date & time: 11/03/22     Chief Complaint   Nausea   History of Present Illness   Kim Fitzgerald is a 46 y.o. year-old female presents to the ED with chief complaint of nausea and vomiting.  She also reports generalized body aches.  She reports subjective fevers and chills, but did not measure her temperature.  She denies having had any diarrhea.  She reports having some chest congestion.  Denies any improvement with OTC meds.  History provided by patient.   Review of Systems  Pertinent positive and negative review of systems noted in HPI.    Physical Exam   Vitals:   11/03/22 0121 11/03/22 0200  BP: 100/68 104/71  Pulse: 66 65  Resp: 18 18  Temp: 98 F (36.7 C)   SpO2: 100% 100%    CONSTITUTIONAL:  non toxic-appearing, NAD NEURO:  Alert and oriented x 3, CN 3-12 grossly intact EYES:  eyes equal and reactive ENT/NECK:  Supple, no stridor  CARDIO:  normal rate, regular rhythm, appears well-perfused  PULM:  No respiratory distress, CTAB GI/GU:  non-distended, no focal tenderness MSK/SPINE:  No gross deformities, no edema, moves all extremities  SKIN:  no rash, atraumatic   *Additional and/or pertinent findings included in MDM below  Diagnostic and Interventional Summary    EKG Interpretation  Date/Time:  Tuesday November 02 2022 15:51:15 EST Ventricular Rate:  78 PR Interval:  135 QRS Duration: 92 QT Interval:  407 QTC Calculation: 464 R Axis:   58 Text Interpretation: Sinus rhythm Anteroseptal infarct, old Confirmed by Nanda Quinton [phone]) on 11/02/2022 3:57:17 PM       Labs Reviewed  RESP PANEL BY RT-PCR (FLU A&B, COVID) ARPGX2 - Abnormal; Notable for the following components:      Result Value   SARS Coronavirus 2 by RT PCR POSITIVE (*)    All other components within normal limits  CBC WITH DIFFERENTIAL/PLATELET - Abnormal; Notable for the following  components:   WBC 3.3 (*)    RBC 3.68 (*)    RDW 10.6 (*)    All other components within normal limits  COMPREHENSIVE METABOLIC PANEL - Abnormal; Notable for the following components:   Potassium 3.3 (*)    Glucose, Bld 124 (*)    Total Protein 8.4 (*)    Total Bilirubin 1.4 (*)    All other components within normal limits  URINALYSIS, ROUTINE W REFLEX MICROSCOPIC - Abnormal; Notable for the following components:   Color, Urine AMBER (*)    All other components within normal limits  LIPASE, BLOOD  TROPONIN I (HIGH SENSITIVITY)  TROPONIN I (HIGH SENSITIVITY)    DG Chest 1 View  Final Result      Medications  acetaminophen (TYLENOL) tablet 1,000 mg (has no administration in time range)  ondansetron (ZOFRAN) injection 4 mg (4 mg Intravenous Given 11/03/22 0221)  sodium chloride 0.9 % bolus 1,000 mL (1,000 mLs Intravenous New Bag/Given 11/03/22 0221)  potassium chloride SA (KLOR-CON M) CR tablet 40 mEq (40 mEq Oral Given 11/03/22 0221)     Procedures  /  Critical Care Procedures  ED Course and Medical Decision Making  I have reviewed the triage vital signs, the nursing notes, and pertinent available records from the EMR.  Social Determinants Affecting Complexity of Care: Patient has no clinically significant social determinants affecting this chief complaint..   ED Course:    Medical Decision Making Patient  here with nausea and vomiting also reporting cough and chest congestion.  Work-up initiated in triage is notable for positive COVID test.  Patient's main symptom now is nausea and vomiting.  She has not vomited in the ED.  She is given a dose of Zofran as well as some fluid.  She feels improved, but still has some headache.  Will give dose of Tylenol.  Plan for discharge with Zofran and Paxlovid.  Return precautions discussed.  Amount and/or Complexity of Data Reviewed Labs: ordered.    Details: Mild hypokalemia of 3.3, normal creatinine, GFR greater than 60, mild  leukocytosis, COVID-positive, urinalysis unremarkable Radiology: ordered and independent interpretation performed.    Details: No large opacity or effusion on chest x-ray  Risk Prescription drug management.     Consultants: No consultations were needed in caring for this patient.   Treatment and Plan: Emergency department workup does not suggest an emergent condition requiring admission or immediate intervention beyond  what has been performed at this time. The patient is safe for discharge and has  been instructed to return immediately for worsening symptoms, change in  symptoms or any other concerns    Final Clinical Impressions(s) / ED Diagnoses     ICD-10-CM   1. COVID  U07.1       ED Discharge Orders          Ordered    nirmatrelvir/ritonavir EUA (PAXLOVID) 20 x 150 MG & 10 x '100MG'$  TABS  2 times daily        11/03/22 0236    ondansetron (ZOFRAN-ODT) 4 MG disintegrating tablet  Every 8 hours PRN        11/03/22 0236              Discharge Instructions Discussed with and Provided to Patient:   Discharge Instructions   None      Montine Circle, PA-C 11/03/22 0247    Quintella Reichert, MD 11/03/22 [phone]

## 2022-11-29 ENCOUNTER — Ambulatory Visit (INDEPENDENT_AMBULATORY_CARE_PROVIDER_SITE_OTHER): Payer: Medicare Other

## 2022-11-29 ENCOUNTER — Ambulatory Visit (HOSPITAL_COMMUNITY)
Admission: EM | Admit: 2022-11-29 | Discharge: 2022-11-29 | Disposition: A | Discharge status: Home / Self Care | Payer: Medicare Other | Attending: Internal Medicine | Admitting: Internal Medicine

## 2022-11-29 ENCOUNTER — Encounter (HOSPITAL_COMMUNITY): Payer: Self-pay

## 2022-11-29 DIAGNOSIS — M25571 Pain in right ankle and joints of right foot: Secondary | ICD-10-CM | POA: Diagnosis not present

## 2022-11-29 DIAGNOSIS — S99921A Unspecified injury of right foot, initial encounter: Secondary | ICD-10-CM | POA: Diagnosis not present

## 2022-11-29 MED ORDER — HYDROCODONE-ACETAMINOPHEN 5-325 MG PO TABS: 1.0000 | ORAL_TABLET | Freq: Four times a day (QID) | ORAL | 0 refills | Status: DC | PRN

## 2022-11-29 NOTE — Discharge Instructions (Signed)
Tomorrow switch from ice therapy to heat therapy. Soaking your foot/ankle in warm water with epsom salt can help with the pain and swelling.   If you take the hydrocodone/acetaminophen prescription pain medicine, be aware that it contains tylenol (acetaminophen is tylenol)  Follow up with the foot specialists if your foot is not healing.

## 2022-11-29 NOTE — ED Triage Notes (Signed)
Pt states tripped over some steps on Saturday evening and twisted rt ankle. States has been icing and taking Ibuprofen for pain.

## 2022-11-29 NOTE — ED Provider Notes (Signed)
[facility]    CSN: [id number] Arrival date & time: 11/29/22  1504      History   Chief Complaint Chief Complaint  Patient presents with   Ankle Pain    HPI Kim Fitzgerald is a 46 y.o. female. She missed the last step on stairs 2 nights ago and landed hard on her R foot/twisted ankle. Hurts to bear weight. Has taken ibuprofen and iced it. Worried she might have broken something.    Ankle Pain   Past Medical History:  Diagnosis Date   GERD (gastroesophageal reflux disease)    H. 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   Sjogren's disease ([place]) 08/15/2017   GERD (gastroesophageal reflux disease) 06/30/2011   History of colonic polyps 06/30/2011    Past Surgical History:  Procedure Laterality Date   APPENDECTOMY     BIOPSY  08/27/2022   Procedure: BIOPSY;  Surgeon: Otis Brace, MD;  Location: [facility];  Service: Gastroenterology;;   CHOLECYSTECTOMY     cholelithiasis   DILATATION & CURRETTAGE/HYSTEROSCOPY WITH RESECTOCOPE N/A 08/15/2014   Procedure: DILATATION & CURETTAGE, HYSTEROSCOPY WITH myosure;  Surgeon: Marvene Staff, MD;  Location: [facility];  Service: Gynecology;  Laterality: N/A;   ESOPHAGOGASTRODUODENOSCOPY  07/16/2011   mild gastritis   ESOPHAGOGASTRODUODENOSCOPY N/A 08/27/2022   Procedure: ESOPHAGOGASTRODUODENOSCOPY (EGD);  Surgeon: Otis Brace, MD;  Location: [facility];  Service: Gastroenterology;  Laterality: N/A;   OVARIAN CYST REMOVAL  2010   laproscopic    OB History   No obstetric history on  file.      Home Medications    Prior to Admission medications   Medication Sig Start Date End Date Taking? Authorizing Provider  buPROPion (WELLBUTRIN XL) 150 MG 24 hr tablet Take 150 mg by mouth daily. 07/17/18   [provider]  citalopram (CELEXA) 10 MG tablet Take 10 mg by mouth daily. 11/08/19   [provider]  dicyclomine (BENTYL) 20 MG tablet Take 20 mg by mouth 4 (four) times daily as needed for spasms. 08/24/22   [provider]  gabapentin (NEURONTIN) 300 MG capsule Take 300 mg by mouth 2 (two) times daily. 06/24/17   [provider]  HYDROcodone-acetaminophen (NORCO/VICODIN) 5-325 MG tablet Take 1 tablet by mouth every 6 (six) hours as needed. 11/29/22   Carvel Getting, NP  hydroxychloroquine (PLAQUENIL) 200 MG tablet Take 200 mg by mouth 2 (two) times daily.    [provider]  meclizine (ANTIVERT) 25 MG tablet Take 1 tablet (25 mg total) by mouth 3 (three) times daily as needed for dizziness. Patient not taking: Reported on 07/24/2022 02/05/18   Orpah Greek, MD  methotrexate (RHEUMATREX) 2.5 MG tablet Take 20 mg by mouth once a week. Pt states this medication is currently on hold 01/18/20   [provider]  ondansetron (ZOFRAN-ODT) 4 MG disintegrating tablet Take 1 tablet (4 mg total) by mouth every 8 (eight) hours as needed for nausea or vomiting. 11/03/22   Montine Circle, PA-C  pantoprazole (PROTONIX) 40 MG tablet Bid x 4 wk then  daily 08/28/22   Antonieta Pert, MD  polyethylene glycol (MIRALAX) packet Take 17 g by mouth daily. Patient not taking: Reported on 10/04/2018 04/29/18   Delia Heady, PA-C  promethazine (PHENERGAN) 25 MG tablet Take 25 mg by mouth every 6 (six) hours as needed for nausea or vomiting. 08/24/22   [provider]  sucralfate (CARAFATE) 1 GM/10ML suspension Take 10 mLs (1 g total) by mouth 2 (two) times daily for 14 days. 08/28/22 09/11/22  Antonieta Pert, MD  tiZANidine (ZANAFLEX) 4 MG tablet Take 4  mg by mouth at bedtime. 06/25/22   [provider]  zolpidem (AMBIEN) 10 MG tablet Take 10 mg by mouth at bedtime. 05/27/21   [provider]    Family History Family History  Problem Relation Age of Onset   Heart failure Mother    Alcohol abuse Father    Crohn's disease Brother 26    Social History Social History   Tobacco Use   Smoking status: Former    Packs/day: 1.00    Years: 12.00    Total pack years: 12.00    Types: E-cigarettes, Cigarettes    Quit date: 07/11/2014    Years since quitting: 8.3   Smokeless tobacco: Never  Vaping Use   Vaping Use: Never used  Substance Use Topics   Alcohol use: No   Drug use: No     Allergies   Patient has no known allergies.   Review of Systems Review of Systems   Physical Exam Triage Vital Signs ED Triage Vitals [11/29/22 1814]  Enc Vitals Group     BP 115/89     Pulse Rate 90     Resp 18     Temp 98.3 F (36.8 C)     Temp Source Oral     SpO2 100 %     Weight      Height      Head Circumference      Peak Flow      Pain Score 8     Pain Loc      Pain Edu?      Excl. in [place]?    No data found.  Updated Vital Signs BP 115/89 (BP Location: Left Arm)   Pulse 90   Temp 98.3 F (36.8 C) (Oral)   Resp 18   LMP  (LMP Unknown) Comment: ovarian failure  SpO2 100%   Visual Acuity Right Eye Distance:   Left Eye Distance:   Bilateral Distance:    Right Eye Near:   Left Eye Near:    Bilateral Near:     Physical Exam Constitutional:      Appearance: Normal appearance.  Pulmonary:     Effort: Pulmonary effort is normal.  Musculoskeletal:     Right foot: No deformity.       Feet:  Feet:     Right foot:     Skin integrity: Skin integrity normal.  Skin:    General: Skin is warm and dry.  Neurological:     Mental Status: She is alert.     Gait: Gait normal.      UC Treatments / Results  Labs (all labs ordered are listed, but only abnormal results are displayed) Labs Reviewed - No  data to display  EKG   Radiology DG Ankle Complete Right  Result Date: 11/29/2022 CLINICAL DATA:  Twisted right ankle 2 days prior tripping on steps. Pain. EXAM: RIGHT ANKLE - COMPLETE 3+ VIEW COMPARISON:  None Available. FINDINGS: Normal  bone mineralization. Mild lateral malleolar soft tissue swelling. No acute fracture is seen. No dislocation. IMPRESSION: Mild lateral malleolar soft tissue swelling. No acute fracture. Electronically Signed   By: Yvonne Kendall M.D.   On: 11/29/2022 18:44    Procedures Procedures (including critical care time)  Medications Ordered in UC Medications - No data to display  Initial Impression / Assessment and Plan / UC Course  I have reviewed the triage vital signs and the nursing notes.  Pertinent labs & imaging results that were available during my care of the patient were reviewed by me and considered in my medical decision making (see chart for details).    No fx on xray. Pt reports signficant pain with bearing weight, requests crutches for ambulating. Is disabled, does not work, is able to rest injury at home. Since pt hospitalized for vomiting and abd pain 3 months ago and instructed not to take NSAIDs. Rx a few norco and advised pt to use tylenol when not taking norco.   Final Clinical Impressions(s) / UC Diagnoses   Final diagnoses:  Injury of right foot, initial encounter     Discharge Instructions      Tomorrow switch from ice therapy to heat therapy. Soaking your foot/ankle in warm water with epsom salt can help with the pain and swelling.   If you take the hydrocodone/acetaminophen prescription pain medicine, be aware that it contains tylenol (acetaminophen is tylenol)  Follow up with the foot specialists if your foot is not healing.    ED Prescriptions     Medication Sig Dispense Auth. Provider   HYDROcodone-acetaminophen (NORCO/VICODIN) 5-325 MG tablet Take 1 tablet by mouth every 6 (six) hours as needed. 8 tablet Carvel Getting, NP      I have reviewed the PDMP during this encounter.   Carvel Getting, NP 11/29/22 1944

## 2022-12-23 ENCOUNTER — Ambulatory Visit: Payer: Medicare Other | Admitting: Podiatry

## 2022-12-23 DIAGNOSIS — M7751 Other enthesopathy of right foot: Secondary | ICD-10-CM

## 2022-12-23 DIAGNOSIS — M778 Other enthesopathies, not elsewhere classified: Secondary | ICD-10-CM

## 2023-06-22 ENCOUNTER — Telehealth: Payer: Self-pay | Admitting: Internal Medicine

## 2023-06-22 NOTE — Telephone Encounter (Signed)
Good Afternoon Dr. Leonides Schanz  Supervising MD for today 7/3 PM  Patient called stating that her PCP Aleatha Borer NP had sent a referral to our practice for her to have a colonoscopy. After looking at patients chart she has previous GI history with [facility] in 2014 and previously had an EGD on 08/27/22 with Dr. Levora Angel at [facility]. Patients records are in [facility], will you please review and advise on scheduling patient?   Thank you.

## 2023-06-24 NOTE — Telephone Encounter (Signed)
I apologize.  Dr. Tomasa Rand,  Will you please review and advise on scheduling patient?  Thank you.

## 2023-07-15 NOTE — Telephone Encounter (Signed)
LVM for patient to call back to schedule

## 2023-08-20 ENCOUNTER — Emergency Department (HOSPITAL_COMMUNITY): Payer: 59

## 2023-08-20 ENCOUNTER — Inpatient Hospital Stay (HOSPITAL_COMMUNITY)
Admission: EM | Admit: 2023-08-20 | Discharge: 2023-08-24 | DRG: 390 | Disposition: A | Discharge status: Home / Self Care | Payer: 59 | Attending: Internal Medicine | Admitting: Internal Medicine

## 2023-08-20 ENCOUNTER — Encounter (HOSPITAL_COMMUNITY): Payer: Self-pay | Admitting: Emergency Medicine

## 2023-08-20 ENCOUNTER — Other Ambulatory Visit: Payer: Self-pay

## 2023-08-20 DIAGNOSIS — G8929 Other chronic pain: Secondary | ICD-10-CM | POA: Diagnosis present

## 2023-08-20 DIAGNOSIS — K56601 Complete intestinal obstruction, unspecified as to cause: Principal | ICD-10-CM | POA: Diagnosis present

## 2023-08-20 DIAGNOSIS — Z8249 Family history of ischemic heart disease and other diseases of the circulatory system: Secondary | ICD-10-CM

## 2023-08-20 DIAGNOSIS — M359 Systemic involvement of connective tissue, unspecified: Secondary | ICD-10-CM | POA: Diagnosis present

## 2023-08-20 DIAGNOSIS — F32A Depression, unspecified: Secondary | ICD-10-CM | POA: Diagnosis present

## 2023-08-20 DIAGNOSIS — M3500 Sicca syndrome, unspecified: Secondary | ICD-10-CM | POA: Diagnosis present

## 2023-08-20 DIAGNOSIS — F419 Anxiety disorder, unspecified: Secondary | ICD-10-CM | POA: Diagnosis present

## 2023-08-20 DIAGNOSIS — Z79899 Other long term (current) drug therapy: Secondary | ICD-10-CM

## 2023-08-20 DIAGNOSIS — D472 Monoclonal gammopathy: Secondary | ICD-10-CM | POA: Diagnosis present

## 2023-08-20 DIAGNOSIS — E869 Volume depletion, unspecified: Secondary | ICD-10-CM | POA: Diagnosis not present

## 2023-08-20 DIAGNOSIS — G4700 Insomnia, unspecified: Secondary | ICD-10-CM | POA: Diagnosis present

## 2023-08-20 DIAGNOSIS — M797 Fibromyalgia: Secondary | ICD-10-CM | POA: Diagnosis present

## 2023-08-20 DIAGNOSIS — K219 Gastro-esophageal reflux disease without esophagitis: Secondary | ICD-10-CM | POA: Diagnosis present

## 2023-08-20 DIAGNOSIS — K56609 Unspecified intestinal obstruction, unspecified as to partial versus complete obstruction: Secondary | ICD-10-CM | POA: Diagnosis not present

## 2023-08-20 DIAGNOSIS — Z87891 Personal history of nicotine dependence: Secondary | ICD-10-CM

## 2023-08-20 DIAGNOSIS — E876 Hypokalemia: Secondary | ICD-10-CM | POA: Diagnosis not present

## 2023-08-20 LAB — CBC WITH DIFFERENTIAL/PLATELET
Abs Immature Granulocytes: 0.03 10*3/uL (ref 0.00–0.07)
Basophils Absolute: 0 10*3/uL (ref 0.0–0.1)
Basophils Relative: 0 %
Eosinophils Absolute: 0.1 10*3/uL (ref 0.0–0.5)
Eosinophils Relative: 1 %
HCT: 43.3 % (ref 36.0–46.0)
Hemoglobin: 15.1 g/dL — ABNORMAL HIGH (ref 12.0–15.0)
Immature Granulocytes: 0 %
Lymphocytes Relative: 15 %
Lymphs Abs: 1.5 10*3/uL (ref 0.7–4.0)
MCH: 32.9 pg (ref 26.0–34.0)
MCHC: 34.9 g/dL (ref 30.0–36.0)
MCV: 94.3 fL (ref 80.0–100.0)
Monocytes Absolute: 0.2 10*3/uL (ref 0.1–1.0)
Monocytes Relative: 2 %
Neutro Abs: 8.2 10*3/uL — ABNORMAL HIGH (ref 1.7–7.7)
Neutrophils Relative %: 82 %
Platelets: 174 10*3/uL (ref 150–400)
RBC: 4.59 MIL/uL (ref 3.87–5.11)
RDW: 11.1 % — ABNORMAL LOW (ref 11.5–15.5)
WBC: 10.1 10*3/uL (ref 4.0–10.5)
nRBC: 0 % (ref 0.0–0.2)

## 2023-08-20 LAB — COMPREHENSIVE METABOLIC PANEL
ALT: 16 U/L (ref 0–44)
AST: 24 U/L (ref 15–41)
Albumin: 4.9 g/dL (ref 3.5–5.0)
Alkaline Phosphatase: 65 U/L (ref 38–126)
Anion gap: 11 (ref 5–15)
BUN: 10 mg/dL (ref 6–20)
CO2: 26 mmol/L (ref 22–32)
Calcium: 10.1 mg/dL (ref 8.9–10.3)
Chloride: 99 mmol/L (ref 98–111)
Creatinine, Ser: 0.83 mg/dL (ref 0.44–1.00)
GFR, Estimated: >60 mL/min (ref >60)
Glucose, Bld: 135 mg/dL — ABNORMAL HIGH (ref 70–99)
Potassium: 3.3 mmol/L — ABNORMAL LOW (ref 3.5–5.1)
Sodium: 136 mmol/L (ref 135–145)
Total Bilirubin: 1.4 mg/dL — ABNORMAL HIGH (ref 0.3–1.2)
Total Protein: 9.5 g/dL — ABNORMAL HIGH (ref 6.5–8.1)

## 2023-08-20 LAB — LIPASE, BLOOD: Lipase: 40 U/L (ref 11–51)

## 2023-08-20 LAB — CBG MONITORING, ED: Glucose-Capillary: 95 mg/dL (ref 70–99)

## 2023-08-20 LAB — TROPONIN I (HIGH SENSITIVITY): Troponin I (High Sensitivity): <2 ng/L (ref <18)

## 2023-08-20 MED ORDER — SODIUM CHLORIDE 0.9 % IV SOLN: Freq: Once | INTRAVENOUS | Status: AC

## 2023-08-20 MED ORDER — PANTOPRAZOLE SODIUM 40 MG IV SOLR
40.0000 mg | Freq: Once | INTRAVENOUS | Status: AC
  Administered 2023-08-20: 40 mg via INTRAVENOUS
  Filled 2023-08-20: qty 10

## 2023-08-20 MED ORDER — ONDANSETRON HCL 4 MG/2ML IJ SOLN
4.0000 mg | Freq: Once | INTRAMUSCULAR | Status: AC
  Administered 2023-08-20: 4 mg via INTRAVENOUS
  Filled 2023-08-20: qty 2

## 2023-08-20 MED ORDER — IOHEXOL 350 MG/ML SOLN
100.0000 mL | Freq: Once | INTRAVENOUS | Status: AC | PRN
  Administered 2023-08-20: 100 mL via INTRAVENOUS

## 2023-08-20 NOTE — ED Provider Notes (Signed)
[place] EM[facility]ENCY DEPARTMENT AT [facility] Provider Note   CSN: [id number] Arrival date & time: 08/20/23  2038     History {Add pertinent medical, surgical, social history, OB history to HPI:1} Chief Complaint  Patient presents with   Chest Pain    Kim Fitzgerald is a 47 y.o. female.  HPI Patient reports 2 hours prior to arrival she got a severe pain in her upper abdomen.  She indicates her epigastrium.  She reports it is very sharp and intense.  It radiated through to her back.  She reports now it feels like it is up in her central chest as well.  Reports pain has eased in severity since first onset.  She reports when the pain started she did vomit once at home and vomited once here.  She reports of vomiting seem to make it feel worse.  Patient reports that she only ate a salad today.  She reports her appetite have been diminished today.  She has had no diarrhea.  No fever no cough.  She denies she has had shortness of breath, lower extremity swelling or calf pain.    Home Medications Prior to Admission medications   Medication Sig Start Date End Date Taking? Authorizing Provider  buPROPion (WELLBUTRIN XL) 150 MG 24 hr tablet Take 150 mg by mouth daily. 07/17/18   [provider]  citalopram (CELEXA) 10 MG tablet Take 10 mg by mouth daily. 11/08/19   [provider]  dicyclomine (BENTYL) 20 MG tablet Take 20 mg by mouth 4 (four) times daily as needed for spasms. 08/24/22   [provider]  gabapentin (NEURONTIN) 300 MG capsule Take 300 mg by mouth 2 (two) times daily. 06/24/17   [provider]  HYDROcodone-acetaminophen (NORCO/VICODIN) 5-325 MG tablet Take 1 tablet by mouth every 6 (six) hours as needed. 11/29/22   Cathlyn Parsons, NP  hydroxychloroquine (PLAQUENIL) 200 MG tablet Take 200 mg by mouth 2 (two) times daily.    [provider]  meclizine (ANTIVERT) 25 MG tablet Take 1 tablet (25 mg total) by mouth 3 (three) times  daily as needed for dizziness. Patient not taking: Reported on 07/24/2022 02/05/18   Gilda Crease, MD  methotrexate (RHEUMATREX) 2.5 MG tablet Take 20 mg by mouth once a week. Pt states this medication is currently on hold 01/18/20   [provider]  ondansetron (ZOFRAN-ODT) 4 MG disintegrating tablet Take 1 tablet (4 mg total) by mouth every 8 (eight) hours as needed for nausea or vomiting. 11/03/22   Roxy Horseman, PA-C  pantoprazole (PROTONIX) 40 MG tablet Bid x 4 wk then daily 08/28/22   Lanae Boast, MD  polyethylene glycol (MIRALAX) packet Take 17 g by mouth daily. Patient not taking: Reported on 10/04/2018 04/29/18   Dietrich Pates, PA-C  promethazine (PHENERGAN) 25 MG tablet Take 25 mg by mouth every 6 (six) hours as needed for nausea or vomiting. 08/24/22   [provider]  sucralfate (CARAFATE) 1 GM/10ML suspension Take 10 mLs (1 g total) by mouth 2 (two) times daily for 14 days. 08/28/22 09/11/22  Lanae Boast, MD  tiZANidine (ZANAFLEX) 4 MG tablet Take 4 mg by mouth at bedtime. 06/25/22   [provider]  zolpidem (AMBIEN) 10 MG tablet Take 10 mg by mouth at bedtime. 05/27/21   [provider]      Allergies    Patient has no known allergies.    Review of Systems   Review of Systems  Physical  Exam Updated Vital Signs BP (!) 127/97 (BP Location: Left Arm)   Temp 97.8 F (36.6 C) (Oral)   Resp 20   Ht 5\' 6"  (1.676 m)   Wt 72.6 kg   LMP  (LMP Unknown) Comment: ovarian failure  BMI 25.82 kg/m  Physical Exam Constitutional:      Comments: Alert nontoxic.  Clear mental status.  No respiratory distress.  Well-nourished well-developed.  HENT:     Head: Normocephalic and atraumatic.     Mouth/Throat:     Pharynx: Oropharynx is clear.  Eyes:     Extraocular Movements: Extraocular movements intact.  Cardiovascular:     Rate and Rhythm: Normal rate and regular rhythm.  Pulmonary:     Effort: Pulmonary effort is normal.     Breath sounds: Normal  breath sounds.  Abdominal:     General: There is no distension.     Palpations: Abdomen is soft.     Tenderness: There is no abdominal tenderness. There is no guarding.  Musculoskeletal:        General: No swelling or tenderness. Normal range of motion.     Right lower leg: No edema.     Left lower leg: No edema.  Skin:    General: Skin is warm and dry.  Neurological:     General: No focal deficit present.     Mental Status: She is oriented to person, place, and time.     Coordination: Coordination normal.  Psychiatric:        Mood and Affect: Mood normal.     ED Results / Procedures / Treatments   Labs (all labs ordered are listed, but only abnormal results are displayed) Labs Reviewed  COMPREHENSIVE METABOLIC PANEL  LIPASE, BLOOD  CBC WITH DIFFERENTIAL/PLATELET  URINALYSIS, ROUTINE W REFLEX MICROSCOPIC  RAPID URINE DRUG SCREEN, HOSP PERFORMED  CBG MONITORING, ED  TROPONIN I (HIGH SENSITIVITY)    EKG EKG Interpretation Date/Time:  Saturday August 20 2023 20:55:31 EDT Ventricular Rate:  83 PR Interval:  137 QRS Duration:  91 QT Interval:  388 QTC Calculation: 456 R Axis:   -16  Text Interpretation: Sinus rhythm Borderline left axis deviation nonspecific t wave flattening III compared to previous, otherwise no sig change Confirmed by Arby Barrette [phone]) on 08/20/2023 9:12:35 PM  Radiology No results found.  Procedures Procedures  {Document cardiac monitor, telemetry assessment procedure when appropriate:1}  Medications Ordered in ED Medications  0.9 %  sodium chloride infusion (has no administration in time range)  pantoprazole (PROTONIX) injection 40 mg (has no administration in time range)    ED Course/ Medical Decision Making/ A&P   {   Click here for ABCD2, HEART and other calculatorsREFRESH Note before signing :1}                              Medical Decision Making Amount and/or Complexity of Data Reviewed Labs: ordered. Radiology:  ordered.   Patient presents with abrupt onset of epigastric pain radiating through to her back into her chest.  Upon presentation to triage patient had clammy appearance and nurses advised they had difficulty getting peripheral pulses.  Patient was brought back to the room where I examined her.  At this time her color is good she is warm and dry and pain has eased off.  Patient is normotensive.  Patient may have had more pain related vagal episode in triage.  There is concern however for significant etiology  such as aortic dissection\aneurysm\bowel obstruction\pancreatitis\biliary colic.  Will proceed with labs and CT scan.  At this time patient's pain is not severe will give an initial dose of Protonix.  Review of EMR indicates patient does have prior history of noted gastric wall thickening and suspected gastritis or peptic ulcer disease.  {Document critical care time when appropriate:1} {Document review of labs and clinical decision tools ie heart score, Chads2Vasc2 etc:1}  {Document your independent review of radiology images, and any outside records:1} {Document your discussion with family members, caretakers, and with consultants:1} {Document social determinants of health affecting pt's care:1} {Document your decision making why or why not admission, treatments were needed:1} Final Clinical Impression(s) / ED Diagnoses Final diagnoses:  None    Rx / DC Orders ED Discharge Orders     None

## 2023-08-20 NOTE — ED Triage Notes (Signed)
Patient coming to ED for evaluation of chest pain.  Reports symptoms started suddenly two hours ago.  Pain located in central chest and radiates to abdomen.  Having SHOB and nausea.  Cool and clammy

## 2023-08-20 NOTE — ED Notes (Signed)
Bilateral radial pulses faint.  Doppler used to find R radial.  MD aware of findings.

## 2023-08-21 ENCOUNTER — Inpatient Hospital Stay (HOSPITAL_COMMUNITY): Payer: 59

## 2023-08-21 ENCOUNTER — Emergency Department (HOSPITAL_COMMUNITY): Payer: 59

## 2023-08-21 DIAGNOSIS — G4700 Insomnia, unspecified: Secondary | ICD-10-CM

## 2023-08-21 DIAGNOSIS — M3500 Sicca syndrome, unspecified: Secondary | ICD-10-CM | POA: Diagnosis present

## 2023-08-21 DIAGNOSIS — K56609 Unspecified intestinal obstruction, unspecified as to partial versus complete obstruction: Secondary | ICD-10-CM | POA: Diagnosis present

## 2023-08-21 DIAGNOSIS — K219 Gastro-esophageal reflux disease without esophagitis: Secondary | ICD-10-CM | POA: Diagnosis present

## 2023-08-21 DIAGNOSIS — E876 Hypokalemia: Secondary | ICD-10-CM | POA: Diagnosis not present

## 2023-08-21 DIAGNOSIS — Z87891 Personal history of nicotine dependence: Secondary | ICD-10-CM | POA: Diagnosis not present

## 2023-08-21 DIAGNOSIS — Z8249 Family history of ischemic heart disease and other diseases of the circulatory system: Secondary | ICD-10-CM | POA: Diagnosis not present

## 2023-08-21 DIAGNOSIS — F32A Depression, unspecified: Secondary | ICD-10-CM | POA: Diagnosis present

## 2023-08-21 DIAGNOSIS — M359 Systemic involvement of connective tissue, unspecified: Secondary | ICD-10-CM

## 2023-08-21 DIAGNOSIS — E869 Volume depletion, unspecified: Secondary | ICD-10-CM | POA: Diagnosis not present

## 2023-08-21 DIAGNOSIS — M797 Fibromyalgia: Secondary | ICD-10-CM | POA: Diagnosis present

## 2023-08-21 DIAGNOSIS — G8929 Other chronic pain: Secondary | ICD-10-CM | POA: Diagnosis present

## 2023-08-21 DIAGNOSIS — F419 Anxiety disorder, unspecified: Secondary | ICD-10-CM | POA: Diagnosis present

## 2023-08-21 DIAGNOSIS — K56601 Complete intestinal obstruction, unspecified as to cause: Secondary | ICD-10-CM | POA: Diagnosis present

## 2023-08-21 DIAGNOSIS — Z79899 Other long term (current) drug therapy: Secondary | ICD-10-CM | POA: Diagnosis not present

## 2023-08-21 DIAGNOSIS — M3501 Sicca syndrome with keratoconjunctivitis: Secondary | ICD-10-CM

## 2023-08-21 DIAGNOSIS — D472 Monoclonal gammopathy: Secondary | ICD-10-CM | POA: Diagnosis present

## 2023-08-21 LAB — RAPID URINE DRUG SCREEN, HOSP PERFORMED
Amphetamines: NOT DETECTED
Barbiturates: NOT DETECTED
Benzodiazepines: POSITIVE — AB
Cocaine: NOT DETECTED
Opiates: NOT DETECTED
Tetrahydrocannabinol: NOT DETECTED

## 2023-08-21 LAB — URINALYSIS, ROUTINE W REFLEX MICROSCOPIC
Bilirubin Urine: NEGATIVE
Glucose, UA: NEGATIVE mg/dL
Hgb urine dipstick: NEGATIVE
Ketones, ur: NEGATIVE mg/dL
Leukocytes,Ua: NEGATIVE
Nitrite: NEGATIVE
Protein, ur: NEGATIVE mg/dL
Specific Gravity, Urine: 1.01 (ref 1.005–1.030)
pH: 5 (ref 5.0–8.0)

## 2023-08-21 LAB — TROPONIN I (HIGH SENSITIVITY): Troponin I (High Sensitivity): 2 ng/L (ref <18)

## 2023-08-21 MED ORDER — ONDANSETRON HCL 4 MG/2ML IJ SOLN
4.0000 mg | Freq: Four times a day (QID) | INTRAMUSCULAR | Status: DC | PRN
  Administered 2023-08-21 – 2023-08-24 (×9): 4 mg via INTRAVENOUS
  Filled 2023-08-21 (×9): qty 2

## 2023-08-21 MED ORDER — POTASSIUM CHLORIDE 10 MEQ/100ML IV SOLN
10.0000 meq | INTRAVENOUS | Status: AC
  Administered 2023-08-21 (×3): 10 meq via INTRAVENOUS
  Filled 2023-08-21 (×3): qty 100

## 2023-08-21 MED ORDER — HYDROMORPHONE HCL 1 MG/ML IJ SOLN
1.0000 mg | INTRAMUSCULAR | Status: DC | PRN
  Administered 2023-08-21 (×2): 1 mg via INTRAVENOUS
  Filled 2023-08-21 (×2): qty 1

## 2023-08-21 MED ORDER — LORAZEPAM 2 MG/ML PO CONC
1.0000 mg | Freq: Four times a day (QID) | ORAL | Status: DC | PRN
  Administered 2023-08-22 – 2023-08-23 (×5): 1 mg via SUBLINGUAL
  Filled 2023-08-21 (×5): qty 1

## 2023-08-21 MED ORDER — HYDROMORPHONE HCL 1 MG/ML IJ SOLN
1.0000 mg | INTRAMUSCULAR | Status: DC | PRN
  Administered 2023-08-21 – 2023-08-24 (×18): 1 mg via INTRAVENOUS
  Filled 2023-08-21 (×18): qty 1

## 2023-08-21 MED ORDER — LORAZEPAM 2 MG/ML IJ SOLN
1.0000 mg | Freq: Once | INTRAMUSCULAR | Status: AC | PRN
  Administered 2023-08-21: 1 mg via INTRAVENOUS
  Filled 2023-08-21: qty 1

## 2023-08-21 MED ORDER — DIATRIZOATE MEGLUMINE & SODIUM 66-10 % PO SOLN
90.0000 mL | Freq: Once | ORAL | Status: AC
  Administered 2023-08-21: 90 mL via NASOGASTRIC
  Filled 2023-08-21: qty 90

## 2023-08-21 MED ORDER — LACTATED RINGERS IV SOLN: INTRAVENOUS | Status: AC

## 2023-08-21 MED ORDER — PANTOPRAZOLE SODIUM 40 MG IV SOLR
40.0000 mg | Freq: Two times a day (BID) | INTRAVENOUS | Status: DC
  Administered 2023-08-21 – 2023-08-24 (×7): 40 mg via INTRAVENOUS
  Filled 2023-08-21 (×7): qty 10

## 2023-08-21 MED ORDER — HEPARIN SODIUM (PORCINE) 5000 UNIT/ML IJ SOLN
5000.0000 [IU] | Freq: Three times a day (TID) | INTRAMUSCULAR | Status: DC
  Administered 2023-08-21 – 2023-08-24 (×10): 5000 [IU] via SUBCUTANEOUS
  Filled 2023-08-21 (×10): qty 1

## 2023-08-21 MED ORDER — PHENOL 1.4 % MT LIQD
2.0000 | OROMUCOSAL | Status: DC | PRN
  Filled 2023-08-21: qty 177

## 2023-08-21 MED ORDER — ACETAMINOPHEN 650 MG RE SUPP: 650.0000 mg | RECTAL | Status: DC | PRN

## 2023-08-21 NOTE — ED Notes (Signed)
edh     

## 2023-08-21 NOTE — Progress Notes (Signed)
Briefly, patient is a 47 year old female with undifferentiated autoimmune connective tissue disorder, Sjogren's syndrome, fibromyalgia/chronic pain, MGUS and previous history of SBO who was admitted early this morning with SBO.  CT abdomen showed distal SBO without clear transition point.  Laboratory data were notable for no leukocytosis, no acidosis and mild hypokalemia at 3.3, normal kidney function.  Patient was seen by general surgery who recommended conservative management with NG tube decompression, fluid support and admission to [facility].  On my visit today, patient is lying in bed looking tired She thinks her abdominal pain is somewhat improved at present Abdominal exam reveals a mildly distended abdomen that is firm but not hard, no real tenderness to light palpation.  Agree with plan instituted earlier today with NG tube decompression, IV fluid resuscitation, as needed IV Dilaudid for pain, IV Protonix and repletion of potassium.  Her methotrexate is being held for now.  No changes to present therapeutic regimen.

## 2023-08-21 NOTE — Plan of Care (Signed)
Received patient in bed awake, alert orientx4. C/o nausea relieved by anti-emetic. Pain managed with PRN meds. Left nare NGT to LIWS putting out green bile; 800 ml out this shift. Safety precautions maintained.  Problem: Education: Goal: Knowledge of General Education information will improve Description: Including pain rating scale, medication(s)/side effects and non-pharmacologic comfort measures Outcome: Progressing   Problem: Health Behavior/Discharge Planning: Goal: Ability to manage health-related needs will improve Outcome: Progressing   Problem: Clinical Measurements: Goal: Ability to maintain clinical measurements within normal limits will improve Outcome: Progressing Goal: Will remain free from infection Outcome: Progressing Goal: Diagnostic test results will improve Outcome: Progressing Goal: Respiratory complications will improve Outcome: Progressing Goal: Cardiovascular complication will be avoided Outcome: Progressing   Problem: Activity: Goal: Risk for activity intolerance will decrease Outcome: Progressing   Problem: Nutrition: Goal: Adequate nutrition will be maintained Outcome: Progressing   Problem: Coping: Goal: Level of anxiety will decrease Outcome: Progressing   Problem: Elimination: Goal: Will not experience complications related to bowel motility Outcome: Progressing Goal: Will not experience complications related to urinary retention Outcome: Progressing   Problem: Pain Managment: Goal: General experience of comfort will improve Outcome: Progressing   Problem: Safety: Goal: Ability to remain free from injury will improve Outcome: Progressing   Problem: Skin Integrity: Goal: Risk for impaired skin integrity will decrease Outcome: Progressing

## 2023-08-21 NOTE — H&P (Signed)
History and Physical    Kim Fitzgerald ION:[id number] DOB: September 08, 1976 DOA: 08/20/2023  DOS: the patient was seen and examined on 08/20/2023  PCP: Jettie Pagan, NP   Patient coming from: Home  I have personally briefly reviewed patient's old medical records in  Link  CC: sudden onset of abd pain HPI: 47 year old African-American female history of undifferentiated connective tissue disease, Sjogren's disease, GERD who presents to the ER with a sudden onset of abdominal pain starting around 7 PM.  She was in her usual state of health this evening.  She had sudden onset of abdominal pain around 7 PM.  This was sharp, stabbing in the epigastrium.  She states she bent over.  She had had trouble breathing.  Pain was very intense.  She called an [facility] and had them bring her to the ER.  On arrival temp 97.8 heart rate 80 blood pressure 127/97 satting 100% on room air.  White count 10.1, hemoglobin 15.1, platelets of 174  Lipase of 40  Sodium 136, potassium 3.3, chloride of 99, bicarb of 26, BUN of 10, creatinine 0.8, glucose 153  Total protein 9.5, albumin 4.9, AST 24, ALT 16, alk phos of 65  UDS positive for benzos  UA negative  CT chest abdomen pelvis was negative for PE.  She is status postcholecystectomy.  There are multiple dilated fluid filled small bowel loops in the pelvis.  Distal small bowel is decompressed.  Findings compatible with distal small bowel obstruction.  No transition point noted.  NG tube was placed.  EDP sent secure chat to general surgery.  Patient started on IV fluids.  [facility] hospital[facility] consulted.   ED Course: WBC 10.1, K 3.3, BUN 10, Scr 0.83. CT abd shows SBO  Review of Systems:  Review of Systems  Constitutional: Negative.   HENT: Negative.    Eyes: Negative.   Respiratory: Negative.    Cardiovascular: Negative.   Gastrointestinal:  Positive for abdominal pain and nausea. Negative for constipation, diarrhea and melena.   Genitourinary: Negative.   Musculoskeletal:  Positive for back pain, joint pain and myalgias.       Chronic neck, back and joint pain due to connective tissue disease.  Skin: Negative.   Neurological: Negative.   Endo/Heme/Allergies: Negative.   Psychiatric/Behavioral: Negative.    All other systems reviewed and are negative.   Past Medical History:  Diagnosis Date   GERD (gastroesophageal reflux disease)    H. pylori infection     treated twice   Lupus (HCC)    Ovarian failure    S/P colonoscopy 2006   [place]([facility]), 1 polyp-benign   Undifferentiated connective tissue disease (HCC) 2017    Past Surgical History:  Procedure Laterality Date   APPENDECTOMY     BIOPSY  08/27/2022   Procedure: BIOPSY;  Surgeon: Kathi Der, MD;  Location: [facility];  Service: Gastroenterology;;   CHOLECYSTECTOMY     cholelithiasis   DILATATION & CURRETTAGE/HYSTEROSCOPY WITH RESECTOCOPE N/A 08/15/2014   Procedure: DILATATION & CURETTAGE, HYSTEROSCOPY WITH myosure;  Surgeon: Serita Kyle, MD;  Location: [facility];  Service: Gynecology;  Laterality: N/A;   ESOPHAGOGASTRODUODENOSCOPY  07/16/2011   mild gastritis   ESOPHAGOGASTRODUODENOSCOPY N/A 08/27/2022   Procedure: ESOPHAGOGASTRODUODENOSCOPY (EGD);  Surgeon: Kathi Der, MD;  Location: [facility];  Service: Gastroenterology;  Laterality: N/A;   OVARIAN CYST REMOVAL  2010   laproscopic     reports that she quit smoking about 9 years ago. Her smoking use included e-cigarettes. She started  smoking about 21 years ago. She has a 12 pack-year smoking history. She has never used smokeless tobacco. She reports that she does not drink alcohol and does not use drugs.  No Known Allergies  Family History  Problem Relation Age of Onset   Heart failure Mother    Alcohol abuse Father    Crohn's disease Brother 46    Prior to Admission medications   Medication Sig Start Date End Date Taking? Authorizing Provider   citalopram (CELEXA) 10 MG tablet Take 10 mg by mouth daily. 11/08/19  Yes [provider]  gabapentin (NEURONTIN) 300 MG capsule Take 300 mg by mouth 2 (two) times daily. 06/24/17  Yes [provider]  tiZANidine (ZANAFLEX) 4 MG tablet Take 4 mg by mouth every 6 (six) hours as needed for muscle spasms. 06/25/22  Yes [provider]  zolpidem (AMBIEN) 10 MG tablet Take 10 mg by mouth at bedtime. 05/27/21  Yes [provider]  methotrexate (RHEUMATREX) 2.5 MG tablet Take 20 mg by mouth once a week. Pt states this medication is currently on hold 01/18/20   [provider]  sucralfate (CARAFATE) 1 GM/10ML suspension Take 10 mLs (1 g total) by mouth 2 (two) times daily for 14 days. 08/28/22 09/11/22  Lanae Boast, MD    Physical Exam: Vitals:   08/21/23 0000 08/21/23 0114 08/21/23 0502 08/21/23 0600  BP:  126/85 101/67   Pulse:  90 86 78  Resp:  18 13 12   Temp:  98.9 F (37.2 C) 98.2 F (36.8 C)   TempSrc:  Axillary Axillary   SpO2: 100% 99% 97% 99%  Weight:      Height:        Physical Exam Vitals and nursing note reviewed.  Constitutional:      General: She is not in acute distress.    Appearance: She is normal weight. She is not ill-appearing or diaphoretic.  HENT:     Head: Normocephalic and atraumatic.     Nose: Nose normal.     Comments: +NG tube Eyes:     General: No scleral icterus. Cardiovascular:     Rate and Rhythm: Normal rate and regular rhythm.  Pulmonary:     Effort: Pulmonary effort is normal.     Breath sounds: Normal breath sounds.  Abdominal:     General: Bowel sounds are decreased. There is no distension.     Tenderness: There is abdominal tenderness in the epigastric area.  Musculoskeletal:     Right lower leg: No edema.     Left lower leg: No edema.  Skin:    General: Skin is warm and dry.     Capillary Refill: Capillary refill takes less than 2 seconds.  Neurological:     General: No focal deficit present.      Mental Status: She is alert and oriented to person, place, and time.      Labs on Admission: I have personally reviewed following labs and imaging studies  CBC: Recent Labs  Lab 08/20/23 2120  WBC 10.1  NEUTROABS 8.2*  HGB 15.1*  HCT 43.3  MCV 94.3  PLT 174   Basic Metabolic Panel: Recent Labs  Lab 08/20/23 2120  NA 136  K 3.3*  CL 99  CO2 26  GLUCOSE 135*  BUN 10  CREATININE 0.83  CALCIUM 10.1   GFR: Estimated Creatinine Clearance: 83.6 mL/min (by C-G formula based on SCr of 0.83 mg/dL). Liver Function Tests: Recent Labs  Lab 08/20/23 2120  AST  24  ALT 16  ALKPHOS 65  BILITOT 1.4*  PROT 9.5*  ALBUMIN 4.9   Recent Labs  Lab 08/20/23 2120  LIPASE 40   Cardiac Enzymes: Recent Labs  Lab 08/20/23 2120 08/20/23 2358  TROPONINIHS <2 2   CBG: Recent Labs  Lab 08/20/23 2102  GLUCAP 95   Urine analysis:    Component Value Date/Time   COLORURINE YELLOW 08/20/2023 2108   APPEARANCEUR CLEAR 08/20/2023 2108   LABSPEC 1.010 08/20/2023 2108   PHURINE 5.0 08/20/2023 2108   GLUCOSEU NEGATIVE 08/20/2023 2108   HGBUR NEGATIVE 08/20/2023 2108   BILIRUBINUR NEGATIVE 08/20/2023 2108   KETONESUR NEGATIVE 08/20/2023 2108   PROTEINUR NEGATIVE 08/20/2023 2108   UROBILINOGEN 0.2 04/10/2013 1300   NITRITE NEGATIVE 08/20/2023 2108   LEUKOCYTESUR NEGATIVE 08/20/2023 2108    Radiological Exams on Admission: I have personally reviewed images DG Abdomen 1 View  Result Date: 08/21/2023 CLINICAL DATA:  NG tube placement EXAM: ABDOMEN - 1 VIEW COMPARISON:  Abdominal CT today FINDINGS: NG tube tip is in the stomach.  Visualized lungs clear. IMPRESSION: NG tube in the stomach. Electronically Signed   By: Charlett Nose M.D.   On: 08/21/2023 01:45   CT Angio Chest/Abd/Pel for Dissection W and/or W/WO  Result Date: 08/20/2023 CLINICAL DATA:  Acute aortic syndrome (AAS) suspected.  Chest pain. EXAM: CT ANGIOGRAPHY CHEST, ABDOMEN AND PELVIS TECHNIQUE: Non-contrast CT of the  chest was initially obtained. Multidetector CT imaging through the chest, abdomen and pelvis was performed using the standard protocol during bolus administration of intravenous contrast. Multiplanar reconstructed images and MIPs were obtained and reviewed to evaluate the vascular anatomy. RADIATION DOSE REDUCTION: This exam was performed according to the departmental dose-optimization program which includes automated exposure control, adjustment of the mA and/or kV according to patient size and/or use of iterative reconstruction technique. CONTRAST:  OMNIPAQUE IOHEXOL 350 MG/ML SOLN COMPARISON:  08/24/2022 FINDINGS: CTA CHEST FINDINGS Cardiovascular: Heart is normal size. Aorta is normal caliber. No filling defects in the pulmonary arteries to suggest pulmonary emboli. Mediastinum/Nodes: No mediastinal, hilar, or axillary adenopathy. Trachea and esophagus are unremarkable. Thyroid unremarkable. Lungs/Pleura: Lungs are clear. No focal airspace opacities or suspicious nodules. No effusions. Musculoskeletal: Chest wall soft tissues are unremarkable. No acute bony abnormality. Review of the MIP images confirms the above findings. CTA ABDOMEN AND PELVIS FINDINGS VASCULAR Aorta: Normal caliber aorta without aneurysm, dissection, vasculitis or significant stenosis. Scattered aortic atherosclerosis. Celiac: Patent without evidence of aneurysm, dissection, vasculitis or significant stenosis. SMA: Patent without evidence of aneurysm, dissection, vasculitis or significant stenosis. Renals: Both renal arteries are patent without evidence of aneurysm, dissection, vasculitis, fibromuscular dysplasia or significant stenosis. IMA: Patent without evidence of aneurysm, dissection, vasculitis or significant stenosis. Inflow: Patent without evidence of aneurysm, dissection, vasculitis or significant stenosis. Scattered calcifications. Veins: No obvious venous abnormality within the limitations of this arterial phase study.  Review of the MIP images confirms the above findings. NON-VASCULAR Hepatobiliary: Prior cholecystectomy. Stable small cyst in the anterior left hepatic lobe. There is biliary ductal dilatation with common bile duct measuring up to 11 mm compared to 10 mm previously, not significantly changed. This is most likely related to post cholecystectomy state. Pancreas: No focal abnormality or ductal dilatation. Spleen: No focal abnormality.  Normal size. Adrenals/Urinary Tract: No adrenal abnormality. No focal renal abnormality. No stones or hydronephrosis. Urinary bladder is unremarkable. Stomach/Bowel: Dilated fluid-filled small bowel loops into the pelvis. Distal small bowel is decompressed. Findings compatible with distal small bowel obstruction.  Exact transition not visualized. Stomach and large bowel unremarkable. Appendix normal. Lymphatic: No adenopathy Reproductive: Uterus and adnexa unremarkable.  No mass. Other: No free fluid or free air. Musculoskeletal: No acute bony abnormality. Review of the MIP images confirms the above findings. IMPRESSION: No evidence of aortic aneurysm or dissection. No evidence of pulmonary embolus. No acute cardiopulmonary disease. Evidence of distal small bowel obstruction. Exact transition and cause not visualized. Intrahepatic and extrahepatic biliary ductal dilatation is stable since prior study likely related to post cholecystectomy state. Recommend correlation with LFTs. Electronically Signed   By: Charlett Nose M.D.   On: 08/20/2023 23:25   DG Chest Port 1 View  Result Date: 08/20/2023 CLINICAL DATA:  Chest pain. EXAM: PORTABLE CHEST 1 VIEW COMPARISON:  Chest radiograph dated 11/02/2022. FINDINGS: The heart size and mediastinal contours are within normal limits. Both lungs are clear. The visualized skeletal structures are unremarkable. IMPRESSION: No active disease. Electronically Signed   By: Romona Curls M.D.   On: 08/20/2023 21:59    EKG: My personal interpretation of EKG  shows: NSR    Assessment/Plan Principal Problem:   Small bowel obstruction (HCC) Active Problems:   GERD (gastroesophageal reflux disease)   Undifferentiated connective tissue disease (HCC)   Sjogren's disease (HCC)   Hypokalemia   Insomnia   Assessment and Plan: * Small bowel obstruction (HCC) Admit to med/surg bed. NG tube to LWS. General surgery consulted. Prn IV dilaudid for pain, IV protonix. Continue with IVF.  Insomnia Will Rx ativan 1 mg SL since she is NPO. She normally take ambien at night.  Hypokalemia Replete with IV Kcl.  Sjogren's disease (HCC) Will need to hold methotrexate for now.  Undifferentiated connective tissue disease (HCC) Will need to hold methotrexate for now.   GERD (gastroesophageal reflux disease) Continue with IV protonix.   DVT prophylaxis: SQ Heparin Code Status: Full Code Family Communication: no family at bedside  Disposition Plan: return home  Consults called: EDP has consulted general surgery  Admission status: Inpatient, Med-Surg   Carollee Herter, DO [facility] 08/21/2023, 6:12 AM

## 2023-08-21 NOTE — Consult Note (Signed)
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  This was sharp, very intense, radiated to her back and was very specifically in the upper abdomen/epigastrium.  She reports that previous episodes of abdominal pain have more so been in the lower abdomen.  Pain had improved slightly at the time of ER evaluation.  This was associated with vomiting, decreased appetite.  Reports having eaten a solid yesterday.  No changes in her bowel movements leading up to this event. Does have prior history of noted gastric wall thickening and suspected gastritis versus peptic ulcer disease as well as history of prior partial small bowel obstruction which resolved without intervention.  Of note had an upper endoscopy by Dr. Levora Angel in September 2023 noting grade C esophagitis, diffuse moderate inflammation with congestion, gastritis/erosions and erythema in the entire examined stomach, duodenum appeared normal Vital signs and lab work were reassuring, CT scan notes dilated fluid-filled small bowel loops, decompressed distal small bowel without overt transition point.  NG tube has been placed with minimal output. This morning she feels much better.  No Known Allergies  Past Medical History:  Diagnosis Date   GERD (gastroesophageal reflux disease)    H. pylori infection     treated twice   Lupus (HCC)    Ovarian failure    S/P colonoscopy 2006   [place] w/[place](PAC), 1 polyp-benign   Undifferentiated connective tissue disease (HCC) 2017    Past Surgical History:  Procedure Laterality Date   APPENDECTOMY     BIOPSY  08/27/2022   Procedure: BIOPSY;   Surgeon: Kathi Der, MD;  Location: [facility];  Service: Gastroenterology;;   CHOLECYSTECTOMY     cholelithiasis   DILATATION & CURRETTAGE/HYSTEROSCOPY WITH RESECTOCOPE N/A 08/15/2014   Procedure: DILATATION & CURETTAGE, HYSTEROSCOPY WITH myosure;  Surgeon: Serita Kyle, MD;  Location: [facility];  Service: Gynecology;  Laterality: N/A;   ESOPHAGOGASTRODUODENOSCOPY  07/16/2011   mild gastritis   ESOPHAGOGASTRODUODENOSCOPY N/A 08/27/2022   Procedure: ESOPHAGOGASTRODUODENOSCOPY (EGD);  Surgeon: Kathi Der, MD;  Location: [facility];  Service: Gastroenterology;  Laterality: N/A;   OVARIAN CYST REMOVAL  2010   laproscopic    Family History  Problem Relation Age of Onset   Heart failure Mother    Alcohol abuse Father    Crohn's disease Brother 38    Social History   Socioeconomic History   Marital status: Divorced    Spouse name: Not on file   Number of children: 0   Years of education: Not on file   Highest education level: Not on file  Occupational History   Occupation: property mgmt    Employer: [employer]  Tobacco Use   Smoking status: Former    Current packs/day: 0.00    Average packs/day: 1 pack/day for 12.0 years (12.0 ttl pk-yrs)    Types: E-cigarettes, Cigarettes    Start date: 07/11/2002    Quit date: 07/11/2014    Years since quitting: 9.1   Smokeless tobacco: Never  Vaping Use   Vaping status: Never Used  Substance and Sexual Activity   Alcohol use: No   Drug use: No   Sexual activity: Not Currently    Partners: Male  Other Topics Concern   Not on file  Social History  Narrative   Lives alone   Social Determinants of Health   Financial Resource Strain: Medium Risk (04/07/2023)   Received from [facility], [facility]   Overall Financial Resource Strain (CARDIA)    Difficulty of Paying Living Expenses: Somewhat hard  Food Insecurity: Food Insecurity Present (04/07/2023)   Received from [facility], [facility]   Hunger Vital  Sign    Worried About Running Out of Food in the Last Year: Sometimes true    Ran Out of Food in the Last Year: Sometimes true  Transportation Needs: Unmet Transportation Needs (04/07/2023)   Received from [facility], [facility]   PRAPARE - Transportation    Lack of Transportation (Medical): Yes    Lack of Transportation (Non-Medical): Yes  Physical Activity: Unknown (04/07/2023)   Received from [facility], [facility]   Exercise Vital Sign    Days of Exercise per Week: 0 days    Minutes of Exercise per Session: Not on file  Stress: No Stress Concern Present (04/07/2023)   Received from [facility], [facility] - Occupational Stress Questionnaire    Feeling of Stress : Not at all  Social Connections: Somewhat Isolated (04/07/2023)   Received from [facility], [facility]   Social Network    How would you rate your social network (family, work, friends)?: Restricted participation with some degree of social isolation    No current facility-administered medications on file prior to encounter.   Current Outpatient Medications on File Prior to Encounter  Medication Sig Dispense Refill   citalopram (CELEXA) 10 MG tablet Take 10 mg by mouth daily.     gabapentin (NEURONTIN) 300 MG capsule Take 300 mg by mouth 2 (two) times daily.  4   tiZANidine (ZANAFLEX) 4 MG tablet Take 4 mg by mouth every 6 (six) hours as needed for muscle spasms.     zolpidem (AMBIEN) 10 MG tablet Take 10 mg by mouth at bedtime.     methotrexate (RHEUMATREX) 2.5 MG tablet Take 20 mg by mouth once a week. Pt states this medication is currently on hold     sucralfate (CARAFATE) 1 GM/10ML suspension Take 10 mLs (1 g total) by mouth 2 (two) times daily for 14 days. 280 mL 0    Review of Systems: a complete, 10pt review of systems was completed with pertinent positives and negatives as documented in the HPI  Physical Exam: Vitals:   08/21/23 0600 08/21/23 0632   BP:  115/81  Pulse: 78 82  Resp: 12 (!) 22  Temp:  98.6 F (37 C)  SpO2: 99% 98%   Gen: A&Ox3, no distress  Eyes: lids and conjunctivae normal, no icterus Chest: respiratory effort is normal.   Cardiovascular: RRR with palpable distal pulses, no pedal edema Gastrointestinal: soft, nondistended, nontender. No mass, hepatomegaly or splenomegaly.  Well-healed low midline incision as well as prior laparoscopic port sites Muscoloskeletal: no clubbing or cyanosis of the fingers.  Strength is symmetrical throughout.  No gross deformity Neuro: cranial nerves grossly intact.  Sensation intact to light touch diffusely. Psych: appropriate mood and affect, normal insight/judgment intact  Skin: warm and dry      Latest Ref Rng & Units 08/20/2023    9:20 PM 11/02/2022    8:11 PM 08/27/2022    5:03 AM  CBC  WBC 4.0 - 10.5 K/uL 10.1  3.3  2.8   Hemoglobin 12.0 - 15.0 g/dL 81.1  91.4  78.2   Hematocrit  36.0 - 46.0 % 43.3  36.1  30.8   Platelets 150 - 400 K/uL 174  171  154        Latest Ref Rng & Units 08/20/2023    9:20 PM 11/02/2022    8:11 PM 08/27/2022    5:03 AM  CMP  Glucose 70 - 99 mg/dL 119  147  829   BUN 6 - 20 mg/dL 10  11  9    Creatinine 0.44 - 1.00 mg/dL 5.62  1.30  8.65   Sodium 135 - 145 mmol/L 136  138  138   Potassium 3.5 - 5.1 mmol/L 3.3  3.3  3.6   Chloride 98 - 111 mmol/L 99  102  107   CO2 22 - 32 mmol/L 26  29  27    Calcium 8.9 - 10.3 mg/dL 78.4  9.5  9.0   Total Protein 6.5 - 8.1 g/dL 9.5  8.4  6.8   Total Bilirubin 0.3 - 1.2 mg/dL 1.4  1.4  1.2   Alkaline Phos 38 - 126 U/L 65  66  42   AST 15 - 41 U/L 24  31  19    ALT 0 - 44 U/L 16  36  18     No results found for: "INR", "PROTIME"  Imaging: DG Abdomen 1 View  Result Date: 08/21/2023 CLINICAL DATA:  NG tube placement EXAM: ABDOMEN - 1 VIEW COMPARISON:  Abdominal CT today FINDINGS: NG tube tip is in the stomach.  Visualized lungs clear. IMPRESSION: NG tube in the stomach. Electronically Signed   By: Charlett Nose M.D.   On: 08/21/2023 01:45   CT Angio Chest/Abd/Pel for Dissection W and/or W/WO  Result Date: 08/20/2023 CLINICAL DATA:  Acute aortic syndrome (AAS) suspected.  Chest pain. EXAM: CT ANGIOGRAPHY CHEST, ABDOMEN AND PELVIS TECHNIQUE: Non-contrast CT of the chest was initially obtained. Multidetector CT imaging through the chest, abdomen and pelvis was performed using the standard protocol during bolus administration of intravenous contrast. Multiplanar reconstructed images and MIPs were obtained and reviewed to evaluate the vascular anatomy. RADIATION DOSE REDUCTION: This exam was performed according to the departmental dose-optimization program which includes automated exposure control, adjustment of the mA and/or kV according to patient size and/or use of iterative reconstruction technique. CONTRAST:  OMNIPAQUE IOHEXOL 350 MG/ML SOLN COMPARISON:  08/24/2022 FINDINGS: CTA CHEST FINDINGS Cardiovascular: Heart is normal size. Aorta is normal caliber. No filling defects in the pulmonary arteries to suggest pulmonary emboli. Mediastinum/Nodes: No mediastinal, hilar, or axillary adenopathy. Trachea and esophagus are unremarkable. Thyroid unremarkable. Lungs/Pleura: Lungs are clear. No focal airspace opacities or suspicious nodules. No effusions. Musculoskeletal: Chest wall soft tissues are unremarkable. No acute bony abnormality. Review of the MIP images confirms the above findings. CTA ABDOMEN AND PELVIS FINDINGS VASCULAR Aorta: Normal caliber aorta without aneurysm, dissection, vasculitis or significant stenosis. Scattered aortic atherosclerosis. Celiac: Patent without evidence of aneurysm, dissection, vasculitis or significant stenosis. SMA: Patent without evidence of aneurysm, dissection, vasculitis or significant stenosis. Renals: Both renal arteries are patent without evidence of aneurysm, dissection, vasculitis, fibromuscular dysplasia or significant stenosis. IMA: Patent without evidence of  aneurysm, dissection, vasculitis or significant stenosis. Inflow: Patent without evidence of aneurysm, dissection, vasculitis or significant stenosis. Scattered calcifications. Veins: No obvious venous abnormality within the limitations of this arterial phase study. Review of the MIP images confirms the above findings. NON-VASCULAR Hepatobiliary: Prior cholecystectomy. Stable small cyst in the anterior left hepatic lobe. There is biliary ductal dilatation with common bile duct  measuring up to 11 mm compared to 10 mm previously, not significantly changed. This is most likely related to post cholecystectomy state. Pancreas: No focal abnormality or ductal dilatation. Spleen: No focal abnormality.  Normal size. Adrenals/Urinary Tract: No adrenal abnormality. No focal renal abnormality. No stones or hydronephrosis. Urinary bladder is unremarkable. Stomach/Bowel: Dilated fluid-filled small bowel loops into the pelvis. Distal small bowel is decompressed. Findings compatible with distal small bowel obstruction. Exact transition not visualized. Stomach and large bowel unremarkable. Appendix normal. Lymphatic: No adenopathy Reproductive: Uterus and adnexa unremarkable.  No mass. Other: No free fluid or free air. Musculoskeletal: No acute bony abnormality. Review of the MIP images confirms the above findings. IMPRESSION: No evidence of aortic aneurysm or dissection. No evidence of pulmonary embolus. No acute cardiopulmonary disease. Evidence of distal small bowel obstruction. Exact transition and cause not visualized. Intrahepatic and extrahepatic biliary ductal dilatation is stable since prior study likely related to post cholecystectomy state. Recommend correlation with LFTs. Electronically Signed   By: Charlett Nose M.D.   On: 08/20/2023 23:25   DG Chest Port 1 View  Result Date: 08/20/2023 CLINICAL DATA:  Chest pain. EXAM: PORTABLE CHEST 1 VIEW COMPARISON:  Chest radiograph dated 11/02/2022. FINDINGS: The heart size and  mediastinal contours are within normal limits. Both lungs are clear. The visualized skeletal structures are unremarkable. IMPRESSION: No active disease. Electronically Signed   By: Romona Curls M.D.   On: 08/20/2023 21:59     A/P: 47 year old woman with history of laparoscopic cholecystectomy and open appendectomy via low midline incision presents with epigastric pain, nausea and vomiting and concern of partial obstruction without transition point on CT.  I have reviewed the images personally. She is afebrile without tachycardia, labs notable for mild hypokalemia 3.3 but no acidosis, acute kidney injury or leukocytosis.  Abdominal exam is benign and she has had minimal NG tube output. Will proceed with small bowel obstruction protocol.    Patient Active Problem List   Diagnosis Date Noted   Small bowel obstruction (HCC) 08/21/2023   Hypokalemia 08/21/2023   Insomnia 08/21/2023   Partial small bowel obstruction (HCC) 08/15/2017   Undifferentiated connective tissue disease (HCC) 08/15/2017   Sjogren's disease (HCC) 08/15/2017   GERD (gastroesophageal reflux disease) 06/30/2011   History of colonic polyps 06/30/2011       Phylliss Blakes, MD [facility]  See AMION to contact appropriate on-call provider

## 2023-08-21 NOTE — Assessment & Plan Note (Signed)
Admit to med/surg bed. NG tube to LWS. General surgery consulted. Prn IV dilaudid for pain, IV protonix. Continue with IVF.

## 2023-08-21 NOTE — Assessment & Plan Note (Signed)
Will Rx ativan 1 mg SL since she is NPO. She normally take ambien at night.

## 2023-08-21 NOTE — Assessment & Plan Note (Signed)
Will need to hold methotrexate for now.

## 2023-08-21 NOTE — Assessment & Plan Note (Signed)
 Continue with IV protonix.

## 2023-08-21 NOTE — Assessment & Plan Note (Signed)
Replete with IV Kcl.

## 2023-08-21 NOTE — Subjective & Objective (Signed)
CC: sudden onset of abd pain HPI: 47 year old African-American female history of undifferentiated connective tissue disease, Sjogren's disease, GERD who presents to the ER with a sudden onset of abdominal pain starting around 7 PM.  She was in her usual state of health this evening.  She had sudden onset of abdominal pain around 7 PM.  This was sharp, stabbing in the epigastrium.  She states she bent over.  She had had trouble breathing.  Pain was very intense.  She called an [facility] and had them bring her to the ER.  On arrival temp 97.8 heart rate 80 blood pressure 127/97 satting 100% on room air.  White count 10.1, hemoglobin 15.1, platelets of 174  Lipase of 40  Sodium 136, potassium 3.3, chloride of 99, bicarb of 26, BUN of 10, creatinine 0.8, glucose 153  Total protein 9.5, albumin 4.9, AST 24, ALT 16, alk phos of 65  UDS positive for benzos  UA negative  CT chest abdomen pelvis was negative for PE.  She is status postcholecystectomy.  There are multiple dilated fluid filled small bowel loops in the pelvis.  Distal small bowel is decompressed.  Findings compatible with distal small bowel obstruction.  No transition point noted.  NG tube was placed.  EDP sent secure chat to general surgery.  Patient started on IV fluids.  [facility] consulted.

## 2023-08-21 NOTE — ED Notes (Signed)
NGT fr 18, depth 60cm. Placed by this RN. Send to xray for confirmatory of placement.

## 2023-08-22 ENCOUNTER — Inpatient Hospital Stay (HOSPITAL_COMMUNITY): Payer: 59

## 2023-08-22 DIAGNOSIS — K56609 Unspecified intestinal obstruction, unspecified as to partial versus complete obstruction: Secondary | ICD-10-CM

## 2023-08-22 LAB — COMPREHENSIVE METABOLIC PANEL
ALT: 12 U/L (ref 0–44)
AST: 17 U/L (ref 15–41)
Albumin: 3.8 g/dL (ref 3.5–5.0)
Alkaline Phosphatase: 50 U/L (ref 38–126)
Anion gap: 9 (ref 5–15)
BUN: 9 mg/dL (ref 6–20)
CO2: 27 mmol/L (ref 22–32)
Calcium: 8.8 mg/dL — ABNORMAL LOW (ref 8.9–10.3)
Chloride: 100 mmol/L (ref 98–111)
Creatinine, Ser: 0.76 mg/dL (ref 0.44–1.00)
GFR, Estimated: >60 mL/min (ref >60)
Glucose, Bld: 95 mg/dL (ref 70–99)
Potassium: 3.3 mmol/L — ABNORMAL LOW (ref 3.5–5.1)
Sodium: 136 mmol/L (ref 135–145)
Total Bilirubin: 1.5 mg/dL — ABNORMAL HIGH (ref 0.3–1.2)
Total Protein: 7.3 g/dL (ref 6.5–8.1)

## 2023-08-22 LAB — CBC WITH DIFFERENTIAL/PLATELET
Abs Immature Granulocytes: 0.02 10*3/uL (ref 0.00–0.07)
Basophils Absolute: 0 10*3/uL (ref 0.0–0.1)
Basophils Relative: 0 %
Eosinophils Absolute: 0 10*3/uL (ref 0.0–0.5)
Eosinophils Relative: 1 %
HCT: 34.9 % — ABNORMAL LOW (ref 36.0–46.0)
Hemoglobin: 11.7 g/dL — ABNORMAL LOW (ref 12.0–15.0)
Immature Granulocytes: 0 %
Lymphocytes Relative: 21 %
Lymphs Abs: 1.3 10*3/uL (ref 0.7–4.0)
MCH: 32.1 pg (ref 26.0–34.0)
MCHC: 33.5 g/dL (ref 30.0–36.0)
MCV: 95.9 fL (ref 80.0–100.0)
Monocytes Absolute: 0.2 10*3/uL (ref 0.1–1.0)
Monocytes Relative: 4 %
Neutro Abs: 4.6 10*3/uL (ref 1.7–7.7)
Neutrophils Relative %: 74 %
Platelets: 131 10*3/uL — ABNORMAL LOW (ref 150–400)
RBC: 3.64 MIL/uL — ABNORMAL LOW (ref 3.87–5.11)
RDW: 10.7 % — ABNORMAL LOW (ref 11.5–15.5)
WBC: 6.1 10*3/uL (ref 4.0–10.5)
nRBC: 0 % (ref 0.0–0.2)

## 2023-08-22 LAB — MAGNESIUM: Magnesium: 1.8 mg/dL (ref 1.7–2.4)

## 2023-08-22 MED ORDER — LACTATED RINGERS IV SOLN: INTRAVENOUS | Status: AC

## 2023-08-22 MED ORDER — TRAZODONE HCL 50 MG PO TABS
50.0000 mg | ORAL_TABLET | Freq: Every day | ORAL | Status: DC
  Administered 2023-08-22 – 2023-08-23 (×2): 50 mg via ORAL
  Filled 2023-08-22 (×2): qty 1

## 2023-08-22 NOTE — Progress Notes (Signed)
PROGRESS NOTE  Kim Fitzgerald  DOB: 1976/04/03  PCP: Jettie Pagan, NP ZOX:[id number]  DOA: 08/20/2023  LOS: 1 day  Hospital Day: 3  Brief narrative: Kim Fitzgerald is a 47 y.o. female with PMH significant for undifferentiated connective tissue disease, Sjogren's disease, GERD, h/o H. pylori infection 8/31, patient presented to the ED with sudden onset sharp stabbing epigastric pain.  In the ED, hemodynamically stable Labs with lipase of 40, left is normal CT scan showed multiple dilated fluid-filled small bowel loops in the pelvis with decompressed distal bowel.  Findings suggestive of distal SBO without noted transition point EDP discussed with general surgery. NG tube was placed Admitted to [facility] for conservative management of SBO  Subjective: Patient was seen and examined this morning.  Pleasant middle-aged African-American female.  Lying on bed.  NG tube attached to suction. Chart reviewed Remains hemodynamically stable Most recent labs from this morning with potassium low at 3.3  Assessment and plan: Small bowel obstruction Presented with sudden onset abdominal pain.   CT abdomen findings as above.   Current on conservative management with IV fluid, IV Protonix, IV antiemetics, pain meds and NG decompression  General Surgery following.  Underwent abdominal x-ray today showed oral contrast throughout the colon and rectum with no dilated large or small bowel.  Nonobstructive bowel gas pattern.  Hypokalemia In the setting of GI fluid loss from NG decompression Potassium low at 3.3 today.  Replacement given IV. Recent Labs  Lab 08/20/23 2120 08/22/23 0349  K 3.3* 3.3*  MG  --  1.8   Insomnia Will Rx ativan 1 mg SL since she is NPO. She normally take ambien at night.   Sjogren's disease Undifferentiated connective tissue disease Currently methotrexate on hold   Anxiety/depression PTA meds-Cymbalta, Neurontin, trazodone, Ambien Currently on hold As  needed Ativan for anxiety control   Mobility: Encourage ambulation  Goals of care   Code Status: Full Code     DVT prophylaxis:  heparin injection 5,000 Units Start: 08/21/23 0715 SCDs Start: 08/21/23 8119   Antimicrobials: None Fluid: LR at 100 mL/h Consultants: General Surgery Family Communication: None at bedside  Status: Inpatient Level of care:  Med-Surg   Patient is from: Home Needs to continue in-hospital care: Ongoing management of bowel obstruction Anticipated d/c to: Home after surgical clearance      Diet:  Diet Order             Diet NPO time specified  Diet effective now                   Scheduled Meds:  heparin  5,000 Units Subcutaneous Q8H   pantoprazole (PROTONIX) IV  40 mg Intravenous Q12H    PRN meds: acetaminophen, HYDROmorphone (DILAUDID) injection, LORazepam, ondansetron (ZOFRAN) IV, phenol   Infusions:   lactated ringers 100 mL/hr at 08/22/23 1532    Antimicrobials: Anti-infectives (From admission, onward)    None       Nutritional status:  Body mass index is 26.94 kg/m.          Objective: Vitals:   08/22/23 0421 08/22/23 1254  BP: 133/85 124/70  Pulse: 94 89  Resp: 17 16  Temp: 98.9 F (37.2 C) 98.9 F (37.2 C)  SpO2: 97% 100%    Intake/Output Summary (Last 24 hours) at 08/22/2023 1634 Last data filed at 08/22/2023 1532 Gross per 24 hour  Intake 1583.42 ml  Output 1300 ml  Net 283.42 ml   [employer]  08/20/23 2051 08/22/23 0421  Weight: 72.6 kg 75.7 kg   Weight change: 3.124 kg Body mass index is 26.94 kg/m.   Physical Exam: General exam: Pleasant middle-aged African-American female.  NG tube attached to suction Skin: No rashes, lesions or ulcers. HEENT: Atraumatic, normocephalic, no obvious bleeding Lungs: Clear to auscultation bilaterally CVS: Regular rate and rhythm, no murmur GI/Abd soft, mild diffuse tenderness, bowel sound but present CNS: Alert, awake monitor x 3 Psychiatry: Sad  affect Extremities: No pedal edema, no calf tenderness  Data Review: I have personally reviewed the laboratory data and studies available.  F/u labs ordered Unresulted Labs (From admission, onward)     Start     Ordered   Unscheduled  Basic metabolic panel  Tomorrow morning,   R        08/22/23 1634   Unscheduled  CBC with Differential/Platelet  Tomorrow morning,   R        08/22/23 1634   Unscheduled  Phosphorus  Tomorrow morning,   R        08/22/23 1634   Unscheduled  Magnesium  Tomorrow morning,   R        08/22/23 1634            Total time spent in review of labs and imaging, patient evaluation, formulation of plan, documentation and communication with family: 45 minutes  Signed, Lorin Glass, MD [facility] 08/22/2023

## 2023-08-22 NOTE — Plan of Care (Signed)

## 2023-08-22 NOTE — Progress Notes (Signed)
   08/22/23 1507  TOC Brief Assessment  Insurance and Status Reviewed  Patient has primary care physician Yes  Home environment has been reviewed yes  Prior level of function: independent  Prior/Current Home Services No current home services  Social Determinants of Health Reivew SDOH reviewed no interventions necessary  Readmission risk has been reviewed Yes  Transition of care needs no transition of care needs at this time

## 2023-08-22 NOTE — Progress Notes (Addendum)
Subjective/Chief Complaint: No acute events, but this morning she feels nauseated and is having epigastric pain again.  Plain films last night show contrast in the colon and some kind of gauze like foreign material in the region of the epigastrium-patient denies eating or swallowing anything since the previous x-ray, and does not recall if there was any material lying on her abdomen or behind her when the x-ray was shot.   Objective: Vital signs in last 24 hours: Temp:  [97.2 F (36.2 C)-98.9 F (37.2 C)] 98.9 F (37.2 C) (09/02 0421) Pulse Rate:  [64-94] 94 (09/02 0421) Resp:  [15-17] 17 (09/02 0421) BP: (106-133)/(73-85) 133/85 (09/02 0421) SpO2:  [81 %-100 %] 97 % (09/02 0421) Weight:  [75.7 kg] 75.7 kg (09/02 0421) Last BM Date : 08/20/23  Intake/Output from previous day: 09/01 0701 - 09/02 0700 In: 1765.9 [I.V.:1765.9] Out: 1300 [Emesis/NG output:1300] Intake/Output this shift: No intake/output data recorded.  Alert, appears fatigued but no distress Unlabored respirations Abdomen is soft, nondistended, nontender; NG output is bilious and 1300 cc recorded; 500 of which was during the night shift.  Lab Results:  Recent Labs    08/20/23 2120 08/22/23 0349  WBC 10.1 6.1  HGB 15.1* 11.7*  HCT 43.3 34.9*  PLT 174 131*   BMET Recent Labs    08/20/23 2120 08/22/23 0349  NA 136 136  K 3.3* 3.3*  CL 99 100  CO2 26 27  GLUCOSE 135* 95  BUN 10 9  CREATININE 0.83 0.76  CALCIUM 10.1 8.8*   PT/INR No results for input(s): "LABPROT", "INR" in the last 72 hours. ABG No results for input(s): "PHART", "HCO3" in the last 72 hours.  Invalid input(s): "PCO2", "PO2"  Studies/Results: DG Abd Portable 1V-Small Bowel Obstruction Protocol-initial, 8 hr delay  Result Date: 08/21/2023 CLINICAL DATA:  SBO Protocol, 8 hour delay image EXAM: PORTABLE ABDOMEN - 1 VIEW COMPARISON:  CT abdomen pelvis 08/20/2023, x-ray abdomen 08/21/2023 1:45 a.m. FINDINGS: Enteric tube courses below  the hemidiaphragm with tip and side port overlying the gastric lumen. The bowel gas pattern is normal. PO contrast reaches the rectosigmoid colon. There is a 2 x 0.7 cm radiopaque gauze-like material overlying the mid abdomen. Similar thinner and smaller finding of 1.5 x 0.3 cm finding overlying the soft tissues on the right. Previously administered intravenous contrast noted excreting bilateral renal collecting systems. Previously administered intravenous contrast opacifies the urinary bladder. IMPRESSION: 1. A 2 x 0.7 cm radiopaque gauze-like material overlying the mid abdomen. Question finding external to the patient versus ingested material or retained postop. Recommend correlation with physical exam. 2. Nonobstructive bowel gas pattern with PO contrast reaches the rectosigmoid colon. 3. Enteric tube in good position. These results will be called to the ordering clinician or representative by the Radiologist Assistant, and communication documented in the PACS or [employer]. Electronically Signed   By: Tish Frederickson M.D.   On: 08/21/2023 22:02   DG Abdomen 1 View  Result Date: 08/21/2023 CLINICAL DATA:  NG tube placement EXAM: ABDOMEN - 1 VIEW COMPARISON:  Abdominal CT today FINDINGS: NG tube tip is in the stomach.  Visualized lungs clear. IMPRESSION: NG tube in the stomach. Electronically Signed   By: Charlett Nose M.D.   On: 08/21/2023 01:45   CT Angio Chest/Abd/Pel for Dissection W and/or W/WO  Result Date: 08/20/2023 CLINICAL DATA:  Acute aortic syndrome (AAS) suspected.  Chest pain. EXAM: CT ANGIOGRAPHY CHEST, ABDOMEN AND PELVIS TECHNIQUE: Non-contrast CT of the chest was initially obtained.  Multidetector CT imaging through the chest, abdomen and pelvis was performed using the standard protocol during bolus administration of intravenous contrast. Multiplanar reconstructed images and MIPs were obtained and reviewed to evaluate the vascular anatomy. RADIATION DOSE REDUCTION: This exam was  performed according to the departmental dose-optimization program which includes automated exposure control, adjustment of the mA and/or kV according to patient size and/or use of iterative reconstruction technique. CONTRAST:  OMNIPAQUE IOHEXOL 350 MG/ML SOLN COMPARISON:  08/24/2022 FINDINGS: CTA CHEST FINDINGS Cardiovascular: Heart is normal size. Aorta is normal caliber. No filling defects in the pulmonary arteries to suggest pulmonary emboli. Mediastinum/Nodes: No mediastinal, hilar, or axillary adenopathy. Trachea and esophagus are unremarkable. Thyroid unremarkable. Lungs/Pleura: Lungs are clear. No focal airspace opacities or suspicious nodules. No effusions. Musculoskeletal: Chest wall soft tissues are unremarkable. No acute bony abnormality. Review of the MIP images confirms the above findings. CTA ABDOMEN AND PELVIS FINDINGS VASCULAR Aorta: Normal caliber aorta without aneurysm, dissection, vasculitis or significant stenosis. Scattered aortic atherosclerosis. Celiac: Patent without evidence of aneurysm, dissection, vasculitis or significant stenosis. SMA: Patent without evidence of aneurysm, dissection, vasculitis or significant stenosis. Renals: Both renal arteries are patent without evidence of aneurysm, dissection, vasculitis, fibromuscular dysplasia or significant stenosis. IMA: Patent without evidence of aneurysm, dissection, vasculitis or significant stenosis. Inflow: Patent without evidence of aneurysm, dissection, vasculitis or significant stenosis. Scattered calcifications. Veins: No obvious venous abnormality within the limitations of this arterial phase study. Review of the MIP images confirms the above findings. NON-VASCULAR Hepatobiliary: Prior cholecystectomy. Stable small cyst in the anterior left hepatic lobe. There is biliary ductal dilatation with common bile duct measuring up to 11 mm compared to 10 mm previously, not significantly changed. This is most likely related to post  cholecystectomy state. Pancreas: No focal abnormality or ductal dilatation. Spleen: No focal abnormality.  Normal size. Adrenals/Urinary Tract: No adrenal abnormality. No focal renal abnormality. No stones or hydronephrosis. Urinary bladder is unremarkable. Stomach/Bowel: Dilated fluid-filled small bowel loops into the pelvis. Distal small bowel is decompressed. Findings compatible with distal small bowel obstruction. Exact transition not visualized. Stomach and large bowel unremarkable. Appendix normal. Lymphatic: No adenopathy Reproductive: Uterus and adnexa unremarkable.  No mass. Other: No free fluid or free air. Musculoskeletal: No acute bony abnormality. Review of the MIP images confirms the above findings. IMPRESSION: No evidence of aortic aneurysm or dissection. No evidence of pulmonary embolus. No acute cardiopulmonary disease. Evidence of distal small bowel obstruction. Exact transition and cause not visualized. Intrahepatic and extrahepatic biliary ductal dilatation is stable since prior study likely related to post cholecystectomy state. Recommend correlation with LFTs. Electronically Signed   By: Charlett Nose M.D.   On: 08/20/2023 23:25   DG Chest Port 1 View  Result Date: 08/20/2023 CLINICAL DATA:  Chest pain. EXAM: PORTABLE CHEST 1 VIEW COMPARISON:  Chest radiograph dated 11/02/2022. FINDINGS: The heart size and mediastinal contours are within normal limits. Both lungs are clear. The visualized skeletal structures are unremarkable. IMPRESSION: No active disease. Electronically Signed   By: Romona Curls M.D.   On: 08/20/2023 21:59    Anti-infectives: Anti-infectives (From admission, onward)    None       Assessment/Plan:  47 year old woman with history of laparoscopic cholecystectomy and open appendectomy via low midline incision presents with epigastric pain, nausea and vomiting and concern of partial obstruction without transition point on CT.  SBO protocol films  reviewed-nonobstructive bowel gas pattern and contrast in the rectosigmoid colon suggestive that the obstruction has resolved however  NG output has increased and she continues to experience nausea and epigastric pain despite decompression. Will leave on suction today to monitor output and repeat a two-view x-ray in radiology later today.  The foreign material noted on the x-ray is of uncertain etiology.  She has not had surgery so it is certainly not a retained postop material as noted in the report, patient denies swallowing anything, suspect this must be external. Given predominantly epigastric pain and history of ulcer disease, she is already on high-dose PPI but would keep this in mind if symptoms do not improve and obstruction continues to appear resolved When NG output goes down would consider clamping trials.   LOS: 1 day    Berna Bue 08/22/2023

## 2023-08-22 NOTE — Plan of Care (Signed)

## 2023-08-23 ENCOUNTER — Ambulatory Visit: Payer: 59 | Admitting: Nurse Practitioner

## 2023-08-23 DIAGNOSIS — K56609 Unspecified intestinal obstruction, unspecified as to partial versus complete obstruction: Secondary | ICD-10-CM | POA: Diagnosis not present

## 2023-08-23 LAB — MAGNESIUM: Magnesium: 1.8 mg/dL (ref 1.7–2.4)

## 2023-08-23 LAB — BASIC METABOLIC PANEL
Anion gap: 8 (ref 5–15)
BUN: 7 mg/dL (ref 6–20)
CO2: 28 mmol/L (ref 22–32)
Calcium: 8.8 mg/dL — ABNORMAL LOW (ref 8.9–10.3)
Chloride: 100 mmol/L (ref 98–111)
Creatinine, Ser: 0.68 mg/dL (ref 0.44–1.00)
GFR, Estimated: >60 mL/min (ref >60)
Glucose, Bld: 92 mg/dL (ref 70–99)
Potassium: 3.6 mmol/L (ref 3.5–5.1)
Sodium: 136 mmol/L (ref 135–145)

## 2023-08-23 LAB — CBC WITH DIFFERENTIAL/PLATELET
Abs Immature Granulocytes: 0.01 10*3/uL (ref 0.00–0.07)
Basophils Absolute: 0 10*3/uL (ref 0.0–0.1)
Basophils Relative: 0 %
Eosinophils Absolute: 0 10*3/uL (ref 0.0–0.5)
Eosinophils Relative: 1 %
HCT: 31 % — ABNORMAL LOW (ref 36.0–46.0)
Hemoglobin: 10.8 g/dL — ABNORMAL LOW (ref 12.0–15.0)
Immature Granulocytes: 0 %
Lymphocytes Relative: 39 %
Lymphs Abs: 1.5 10*3/uL (ref 0.7–4.0)
MCH: 32.9 pg (ref 26.0–34.0)
MCHC: 34.8 g/dL (ref 30.0–36.0)
MCV: 94.5 fL (ref 80.0–100.0)
Monocytes Absolute: 0.1 10*3/uL (ref 0.1–1.0)
Monocytes Relative: 4 %
Neutro Abs: 2.2 10*3/uL (ref 1.7–7.7)
Neutrophils Relative %: 56 %
Platelets: 125 10*3/uL — ABNORMAL LOW (ref 150–400)
RBC: 3.28 MIL/uL — ABNORMAL LOW (ref 3.87–5.11)
RDW: 10.6 % — ABNORMAL LOW (ref 11.5–15.5)
WBC: 3.9 10*3/uL — ABNORMAL LOW (ref 4.0–10.5)
nRBC: 0 % (ref 0.0–0.2)

## 2023-08-23 LAB — PHOSPHORUS: Phosphorus: 3.4 mg/dL (ref 2.5–4.6)

## 2023-08-23 MED ORDER — MAGNESIUM SULFATE IN D5W 1-5 GM/100ML-% IV SOLN
1.0000 g | Freq: Once | INTRAVENOUS | Status: AC
  Administered 2023-08-23: 1 g via INTRAVENOUS
  Filled 2023-08-23: qty 100

## 2023-08-23 MED ORDER — POTASSIUM CHLORIDE CRYS ER 20 MEQ PO TBCR
40.0000 meq | EXTENDED_RELEASE_TABLET | Freq: Two times a day (BID) | ORAL | Status: AC
  Administered 2023-08-23 (×2): 40 meq via ORAL
  Filled 2023-08-23 (×2): qty 2

## 2023-08-23 NOTE — Plan of Care (Signed)

## 2023-08-23 NOTE — Progress Notes (Signed)
Progress Note     Subjective: Pt reports she still has some nausea and her throat hurts. No flatus or BM but feels less distended and not having significant abdominal pain.   Objective: Vital signs in last 24 hours: Temp:  [98.3 F (36.8 C)-98.9 F (37.2 C)] 98.3 F (36.8 C) (09/03 0427) Pulse Rate:  [80-89] 86 (09/03 0427) Resp:  [16-19] 16 (09/03 0427) BP: (112-126)/(69-71) 112/71 (09/03 0427) SpO2:  [95 %-100 %] 100 % (09/03 0427) Weight:  [74.9 kg] 74.9 kg (09/03 0427) Last BM Date : 08/19/23  Intake/Output from previous day: 09/02 0701 - 09/03 0700 In: 728.1 [I.V.:518.1; NG/GT:210] Out: 400 [Emesis/NG output:400] Intake/Output this shift: No intake/output data recorded.  PE: General: pleasant, WD, WN female who is laying in bed in NAD Heart: regular, rate, and rhythm.   Lungs: Respiratory effort nonlabored Abd: soft, NT, ND, +BS, NGT with thin bilious fluid, lower midline surgical scar Psych: A&Ox3 with an appropriate affect.    Lab Results:  Recent Labs    08/22/23 0349 08/23/23 0346  WBC 6.1 3.9*  HGB 11.7* 10.8*  HCT 34.9* 31.0*  PLT 131* 125*   BMET Recent Labs    08/22/23 0349 08/23/23 0346  NA 136 136  K 3.3* 3.6  CL 100 100  CO2 27 28  GLUCOSE 95 92  BUN 9 7  CREATININE 0.76 0.68  CALCIUM 8.8* 8.8*   PT/INR No results for input(s): "LABPROT", "INR" in the last 72 hours. CMP     Component Value Date/Time   NA 136 08/23/2023 0346   K 3.6 08/23/2023 0346   CL 100 08/23/2023 0346   CO2 28 08/23/2023 0346   GLUCOSE 92 08/23/2023 0346   BUN 7 08/23/2023 0346   CREATININE 0.68 08/23/2023 0346   CALCIUM 8.8 (L) 08/23/2023 0346   PROT 7.3 08/22/2023 0349   ALBUMIN 3.8 08/22/2023 0349   AST 17 08/22/2023 0349   ALT 12 08/22/2023 0349   ALKPHOS 50 08/22/2023 0349   BILITOT 1.5 (H) 08/22/2023 0349   GFRNONAA >60 08/23/2023 0346   GFRAA >60 02/23/2020 1149   Lipase     Component Value Date/Time   LIPASE 40 08/20/2023 2120        Studies/Results: DG Abd 2 Views  Result Date: 08/22/2023 CLINICAL DATA:  Small bowel obstruction EXAM: ABDOMEN - 2 VIEW COMPARISON:  None Available. FINDINGS: NG tube in stomach. Oral contrast throughout the colon and rectum. No dilated large or small bowel. Postcholecystectomy. IMPRESSION: Nonobstructive bowel gas pattern. Electronically Signed   By: Genevive Bi M.D.   On: 08/22/2023 13:13   DG Abd Portable 1V-Small Bowel Obstruction Protocol-initial, 8 hr delay  Result Date: 08/21/2023 CLINICAL DATA:  SBO Protocol, 8 hour delay image EXAM: PORTABLE ABDOMEN - 1 VIEW COMPARISON:  CT abdomen pelvis 08/20/2023, x-ray abdomen 08/21/2023 1:45 a.m. FINDINGS: Enteric tube courses below the hemidiaphragm with tip and side port overlying the gastric lumen. The bowel gas pattern is normal. PO contrast reaches the rectosigmoid colon. There is a 2 x 0.7 cm radiopaque gauze-like material overlying the mid abdomen. Similar thinner and smaller finding of 1.5 x 0.3 cm finding overlying the soft tissues on the right. Previously administered intravenous contrast noted excreting bilateral renal collecting systems. Previously administered intravenous contrast opacifies the urinary bladder. IMPRESSION: 1. A 2 x 0.7 cm radiopaque gauze-like material overlying the mid abdomen. Question finding external to the patient versus ingested material or retained postop. Recommend correlation with physical exam. 2. Nonobstructive  bowel gas pattern with PO contrast reaches the rectosigmoid colon. 3. Enteric tube in good position. These results will be called to the ordering clinician or representative by the Radiologist Assistant, and communication documented in the PACS or [employer]. Electronically Signed   By: Tish Frederickson M.D.   On: 08/21/2023 22:02    Anti-infectives: Anti-infectives (From admission, onward)    None        Assessment/Plan SBO - hx of open appendectomy as a child and laparoscopic  cholecystectomy, laparoscopic ovarian cyst removal as well  - NGT output down trending and film with contrast throughout colon and non-obstructive bowel gas pattern - Still having some nausea but will proceed with clamping trial today and if she tolerates NGT clamp and sips and chips this AM then DC NGT this afternoon and start CLD - mobilize as tolerated - keep Mg > 2.0 and K > 4.0 to optimize bowel function  - no plans for emergent surgical intervention at this time, but we will continue to follow   FEN: sips and chips, IVF per TRH, NGT clamping  VTE: SQH ID: no current abx  LOS: 2 days   I reviewed hospitalist notes, last 24 h vitals and pain scores, last 48 h intake and output, last 24 h labs and trends, and last 24 h imaging results.    Juliet Rude, [facility] Surgery 08/23/2023, 9:26 AM Please see Amion for pager number during day hours 7:00am-4:30pm

## 2023-08-23 NOTE — Progress Notes (Signed)
PROGRESS NOTE  Kim Fitzgerald  DOB: 1976/02/01  PCP: Jettie Pagan, NP JXB:[id number]  DOA: 08/20/2023  LOS: 2 days  Hospital Day: 4  Brief narrative: Kim Fitzgerald is a 47 y.o. female with PMH significant for undifferentiated connective tissue disease, Sjogren's disease, GERD, h/o H. pylori infection 8/31, patient presented to the ED with sudden onset sharp stabbing epigastric pain.  In the ED, hemodynamically stable Labs with lipase of 40, left is normal CT scan showed multiple dilated fluid-filled small bowel loops in the pelvis with decompressed distal bowel.  Findings suggestive of distal SBO without noted transition point EDP discussed with general surgery. NG tube was placed Admitted to [facility] for conservative management of SBO  Subjective: Patient was seen and examined this morning.   NG tube was clamped.  She was able to tolerate that. General surgery started her on clear liquid diet earlier this afternoon.  Assessment and plan: Small bowel obstruction Presented with sudden onset abdominal pain.   CT abdomen findings as above.   Current on conservative management with IV fluid, IV Protonix, IV antiemetics, pain meds and NG decompression  General Surgery following.   9/2, underwent abdominal x-ray showed oral contrast throughout the colon and rectum with no dilated large or small bowel.  Nonobstructive bowel gas pattern. Tolerated NG tube clamped.  Started clear liquid diet today.  Continue to monitor.  Hypokalemia In the setting of GI fluid loss from NG decompression Potassium low at 3.3 today.  Replacement given IV. Recent Labs  Lab 08/20/23 2120 08/22/23 0349 08/23/23 0346  K 3.3* 3.3* 3.6  MG  --  1.8 1.8  PHOS  --   --  3.4   Insomnia Continue Ambien nightly   Sjogren's disease Undifferentiated connective tissue disease Currently methotrexate on hold   Anxiety/depression PTA meds-Cymbalta, Neurontin, trazodone, Ambien Currently on  hold As needed Ativan for anxiety control   Mobility: Encourage ambulation  Goals of care   Code Status: Full Code     DVT prophylaxis:  heparin injection 5,000 Units Start: 08/21/23 0715 SCDs Start: 08/21/23 1308   Antimicrobials: None Fluid: LR at 100 mL/h to continue Consultants: General Surgery Family Communication: None at bedside  Status: Inpatient Level of care:  Med-Surg   Patient is from: Home Needs to continue in-hospital care: Ongoing management of bowel obstruction Anticipated d/c to: Home after surgical clearance      Diet:  Diet Order             Diet clear liquid Room service appropriate? Yes; Fluid consistency: Thin  Diet effective now                   Scheduled Meds:  heparin  5,000 Units Subcutaneous Q8H   pantoprazole (PROTONIX) IV  40 mg Intravenous Q12H   potassium chloride  40 mEq Oral BID   traZODone  50 mg Oral QHS    PRN meds: acetaminophen, HYDROmorphone (DILAUDID) injection, LORazepam, ondansetron (ZOFRAN) IV, phenol   Infusions:     Antimicrobials: Anti-infectives (From admission, onward)    None       Nutritional status:  Body mass index is 26.65 kg/m.          Objective: Vitals:   08/23/23 0427 08/23/23 1321  BP: 112/71 121/71  Pulse: 86 69  Resp: 16 16  Temp: 98.3 F (36.8 C) 98.6 F (37 C)  SpO2: 100% 100%    Intake/Output Summary (Last 24 hours) at 08/23/2023 1546 Last data filed  at 08/23/2023 1503 Gross per 24 hour  Intake 100 ml  Output 450 ml  Net -350 ml   Filed Weights   08/20/23 2051 08/22/23 0421 08/23/23 0427  Weight: 72.6 kg 75.7 kg 74.9 kg   Weight change: -0.8 kg Body mass index is 26.65 kg/m.   Physical Exam: General exam: Pleasant middle-aged African-American female.  NG tube attached to suction Skin: No rashes, lesions or ulcers. HEENT: Atraumatic, normocephalic, no obvious bleeding Lungs: Clear to auscultation bilaterally CVS: Regular rate and rhythm, no  murmur GI/Abd soft, mild diffuse tenderness, bowel sound but present CNS: Alert, awake monitor x 3 Psychiatry: Sad affect. Extremities: No pedal edema, no calf tenderness  Data Review: I have personally reviewed the laboratory data and studies available.  F/u labs ordered Unresulted Labs (From admission, onward)    None       Total time spent in review of labs and imaging, patient evaluation, formulation of plan, documentation and communication with family: 45 minutes  Signed, Lorin Glass, MD [facility] 08/23/2023

## 2023-08-24 DIAGNOSIS — K56609 Unspecified intestinal obstruction, unspecified as to partial versus complete obstruction: Secondary | ICD-10-CM | POA: Diagnosis not present

## 2023-08-24 MED ORDER — SENNOSIDES-DOCUSATE SODIUM 8.6-50 MG PO TABS: 1.0000 | ORAL_TABLET | Freq: Every day | ORAL | 0 refills | Status: AC

## 2023-08-24 MED ORDER — ONDANSETRON HCL 4 MG PO TABS: 4.0000 mg | ORAL_TABLET | Freq: Every day | ORAL | 0 refills | Status: AC | PRN

## 2023-08-24 NOTE — Progress Notes (Signed)
Pt dressed, waiting on her mom to arrive. PIV  removed as charted. Pt asked for zofran to be sent in to the pharmacy  at d/c . Request sent to Dr Pola Corn. Pt updated.

## 2023-08-24 NOTE — Progress Notes (Addendum)
   Progress Note     Subjective: Pt reports some nausea still but overall improved. Tolerated CLD yesterday. Had a small BM.   Objective: Vital signs in last 24 hours: Temp:  [98.2 F (36.8 C)-98.7 F (37.1 C)] 98.2 F (36.8 C) (09/04 0414) Pulse Rate:  [68-78] 78 (09/04 0414) Resp:  [16-18] 18 (09/04 0414) BP: (94-126)/(64-71) 98/68 (09/04 0616) SpO2:  [99 %-100 %] 99 % (09/04 0414) Weight:  [72.7 kg] 72.7 kg (09/04 0500) Last BM Date : 08/23/23 (per patient report)  Intake/Output from previous day: 09/03 0701 - 09/04 0700 In: 580 [P.O.:480; IV Piggyback:100] Out: 50 [Emesis/NG output:50] Intake/Output this shift: No intake/output data recorded.  PE: General: pleasant, WD, WN female who is laying in bed in NAD Heart: regular, rate, and rhythm.   Lungs: Respiratory effort nonlabored Abd: soft, NT, ND, +BS, lower midline surgical scar Psych: A&Ox3 with an appropriate affect.    Lab Results:  Recent Labs    08/22/23 0349 08/23/23 0346  WBC 6.1 3.9*  HGB 11.7* 10.8*  HCT 34.9* 31.0*  PLT 131* 125*   BMET Recent Labs    08/22/23 0349 08/23/23 0346  NA 136 136  K 3.3* 3.6  CL 100 100  CO2 27 28  GLUCOSE 95 92  BUN 9 7  CREATININE 0.76 0.68  CALCIUM 8.8* 8.8*   PT/INR No results for input(s): "LABPROT", "INR" in the last 72 hours. CMP     Component Value Date/Time   NA 136 08/23/2023 0346   K 3.6 08/23/2023 0346   CL 100 08/23/2023 0346   CO2 28 08/23/2023 0346   GLUCOSE 92 08/23/2023 0346   BUN 7 08/23/2023 0346   CREATININE 0.68 08/23/2023 0346   CALCIUM 8.8 (L) 08/23/2023 0346   PROT 7.3 08/22/2023 0349   ALBUMIN 3.8 08/22/2023 0349   AST 17 08/22/2023 0349   ALT 12 08/22/2023 0349   ALKPHOS 50 08/22/2023 0349   BILITOT 1.5 (H) 08/22/2023 0349   GFRNONAA >60 08/23/2023 0346   GFRAA >60 02/23/2020 1149   Lipase     Component Value Date/Time   LIPASE 40 08/20/2023 2120       Studies/Results: No results  found.  Anti-infectives: Anti-infectives (From admission, onward)    None        Assessment/Plan SBO - hx of open appendectomy as a child and laparoscopic cholecystectomy, laparoscopic ovarian cyst removal as well  - follow up films with contrast in colon and non-obstructive bowel gas pattern - advance to soft diet - mobilize as tolerated - keep Mg > 2.0 and K > 4.0 to optimize bowel function  - if tolerating soft diet later today then reasonable to DC from surgery standpoint  FEN: sips and chips, IVF per TRH, NGT clamping  VTE: SQH ID: no current abx  LOS: 3 days   I reviewed hospitalist notes, last 24 h vitals and pain scores, last 48 h intake and output, and last 24 h labs and trends.    Juliet Rude, [facility] Surgery 08/24/2023, 9:24 AM Please see Amion for pager number during day hours 7:00am-4:30pm

## 2023-08-24 NOTE — Discharge Summary (Addendum)
Physician Discharge Summary  Kim Fitzgerald:[id number] DOB: 10-15-1976 DOA: 08/20/2023  PCP: Jettie Pagan, NP  Admit date: 08/20/2023 Discharge date: 08/24/2023  Admitted From: Home Discharge disposition: home  Recommendations at discharge:  Stay on soft diet for next 2 to 3 days and gradually advance to regular consistency Avoid constipation   Brief narrative: Kim Fitzgerald is a 47 y.o. female with PMH significant for undifferentiated connective tissue disease, Sjogren's disease, GERD, h/o H. pylori infection 8/31, patient presented to the ED with sudden onset sharp stabbing epigastric pain.  In the ED, hemodynamically stable Labs with lipase of 40, left is normal CT scan showed multiple dilated fluid-filled small bowel loops in the pelvis with decompressed distal bowel.  Findings suggestive of distal SBO without noted transition point EDP discussed with general surgery. NG tube was placed Admitted to [facility] for conservative management of SBO  Subjective: Patient was seen and examined this morning.   NG tube was removed.  Diet advanced to soft by general surgery. Tolerated and feels ready to go.  Hospital course: Small bowel obstruction Presented with sudden onset abdominal pain.   CT abdomen findings as above.   She was started on conservative management with IV fluid, IV Protonix, IV antiemetics, pain meds and NG decompression  9/2, underwent abdominal x-ray showed oral contrast throughout the colon and rectum with no dilated large or small bowel.  Nonobstructive bowel gas pattern. NG tube removed.  Had a bowel movement this morning. Started on clear liquid diet and advanced to soft today by general surgery.  Tolerated and feels ready to go. Avoid constipation.  PRN stool softeners to use.  Hypokalemia In the setting of GI fluid loss from NG decompression Improved with replacement Recent Labs  Lab 08/20/23 2120 08/22/23 0349 08/23/23 0346  K 3.3* 3.3*  3.6  MG  --  1.8 1.8  PHOS  --   --  3.4   Insomnia Continue Ambien nightly   Sjogren's disease Undifferentiated connective tissue disease Currently methotrexate on hold   Anxiety/depression PTA meds-Cymbalta, Neurontin, trazodone, Ambien Currently on hold As needed Ativan for anxiety control   Mobility: Encourage ambulation  Goals of care   Code Status: Full Code   Wounds:  -    Discharge Exam:   Vitals:   08/24/23 0414 08/24/23 0500 08/24/23 0616 08/24/23 1521  BP: 94/64  98/68 114/73  Pulse: 78   71  Resp: 18   18  Temp: 98.2 F (36.8 C)   (!) 97.5 F (36.4 C)  TempSrc: Oral     SpO2: 99%   99%  Weight:  72.7 kg    Height:        Body mass index is 25.87 kg/m.   General exam: Pleasant middle-aged African-American female.   Skin: No rashes, lesions or ulcers. HEENT: Atraumatic, normocephalic, no obvious bleeding Lungs: Clear to auscultation bilaterally CVS: Regular rate and rhythm, no murmur GI/Abd soft, improving tenderness, bowel sound present CNS: Alert, awake monitor x 3 Psychiatry: Mood appropriate Extremities: No pedal edema, no calf tenderness  Follow ups:    Follow-up Information     Jettie Pagan, NP Follow up.   Specialty: Nurse Practitioner Contact information: [address] [place] [address] [phone]                 Discharge Instructions:   Discharge Instructions     Call MD for:  difficulty breathing, headache or visual disturbances   Complete  by: As directed    Call MD for:  extreme fatigue   Complete by: As directed    Call MD for:  hives   Complete by: As directed    Call MD for:  persistant dizziness or light-headedness   Complete by: As directed    Call MD for:  persistant nausea and vomiting   Complete by: As directed    Call MD for:  severe uncontrolled pain   Complete by: As directed    Call MD for:  temperature >100.4   Complete by: As directed    Diet general    Complete by: As directed    Discharge instructions   Complete by: As directed    Recommendations at discharge:   Stay on soft diet for next 2 to 3 days and gradually advance to regular consistency  Avoid constipation  General discharge instructions: Follow with Primary MD Jettie Pagan, NP in 7 days  Please request your PCP  to go over your hospital tests, procedures, radiology results at the follow up. Please get your medicines reviewed and adjusted.  Your PCP may decide to repeat certain labs or tests as needed. Do not drive, operate heavy machinery, perform activities at heights, swimming or participation in water activities or provide baby sitting services if your were admitted for syncope or siezures until you have seen by Primary MD or a Neurologist and advised to do so again. [place] Controlled Substance Reporting System database was reviewed. Do not drive, operate heavy machinery, perform activities at heights, swim, participate in water activities or provide baby-sitting services while on medications for pain, sleep and mood until your outpatient physician has reevaluated you and advised to do so again.  You are strongly recommended to comply with the dose, frequency and duration of prescribed medications. Activity: As tolerated with Full fall precautions use walker/cane & assistance as needed Avoid using any recreational substances like cigarette, tobacco, alcohol, or non-prescribed drug. If you experience worsening of your admission symptoms, develop shortness of breath, life threatening emergency, suicidal or homicidal thoughts you must seek medical attention immediately by calling 911 or calling your MD immediately  if symptoms less severe. You must read complete instructions/literature along with all the possible adverse reactions/side effects for all the medicines you take and that have been prescribed to you. Take any new medicine only after you have completely  understood and accepted all the possible adverse reactions/side effects.  Wear Seat belts while driving. You were cared for by a hospitalist during your hospital stay. If you have any questions about your discharge medications or the care you received while you were in the hospital after you are discharged, you can call the unit and ask to speak with the hospitalist or the covering physician. Once you are discharged, your primary care physician will handle any further medical issues. Please note that NO REFILLS for any discharge medications will be authorized once you are discharged, as it is imperative that you return to your primary care physician (or establish a relationship with a primary care physician if you do not have one).   Increase activity slowly   Complete by: As directed        Discharge Medications:   Allergies as of 08/24/2023   No Known Allergies      Medication List     TAKE these medications    citalopram 10 MG tablet Commonly known as: CELEXA Take 10 mg by mouth daily.   gabapentin 300 MG  capsule Commonly known as: NEURONTIN Take 300 mg by mouth 2 (two) times daily.   methotrexate 2.5 MG tablet Commonly known as: RHEUMATREX Take 20 mg by mouth once a week. Pt states this medication is currently on hold   ondansetron 4 MG tablet Commonly known as: Zofran Take 1 tablet (4 mg total) by mouth daily as needed for nausea or vomiting.   senna-docusate 8.6-50 MG tablet Commonly known as: Senokot-S Take 1 tablet by mouth daily.   sucralfate 1 GM/10ML suspension Commonly known as: Carafate Take 10 mLs (1 g total) by mouth 2 (two) times daily for 14 days.   tiZANidine 4 MG tablet Commonly known as: ZANAFLEX Take 4 mg by mouth every 6 (six) hours as needed for muscle spasms.   zolpidem 10 MG tablet Commonly known as: AMBIEN Take 10 mg by mouth at bedtime.         The results of significant diagnostics from this hospitalization (including imaging,  microbiology, ancillary and laboratory) are listed below for reference.    Procedures and Diagnostic Studies:   DG Abd Portable 1V-Small Bowel Obstruction Protocol-initial, 8 hr delay  Result Date: 08/21/2023 CLINICAL DATA:  SBO Protocol, 8 hour delay image EXAM: PORTABLE ABDOMEN - 1 VIEW COMPARISON:  CT abdomen pelvis 08/20/2023, x-ray abdomen 08/21/2023 1:45 a.m. FINDINGS: Enteric tube courses below the hemidiaphragm with tip and side port overlying the gastric lumen. The bowel gas pattern is normal. PO contrast reaches the rectosigmoid colon. There is a 2 x 0.7 cm radiopaque gauze-like material overlying the mid abdomen. Similar thinner and smaller finding of 1.5 x 0.3 cm finding overlying the soft tissues on the right. Previously administered intravenous contrast noted excreting bilateral renal collecting systems. Previously administered intravenous contrast opacifies the urinary bladder. IMPRESSION: 1. A 2 x 0.7 cm radiopaque gauze-like material overlying the mid abdomen. Question finding external to the patient versus ingested material or retained postop. Recommend correlation with physical exam. 2. Nonobstructive bowel gas pattern with PO contrast reaches the rectosigmoid colon. 3. Enteric tube in good position. These results will be called to the ordering clinician or representative by the Radiologist Assistant, and communication documented in the PACS or [employer]. Electronically Signed   By: Tish Frederickson M.D.   On: 08/21/2023 22:02   DG Abdomen 1 View  Result Date: 08/21/2023 CLINICAL DATA:  NG tube placement EXAM: ABDOMEN - 1 VIEW COMPARISON:  Abdominal CT today FINDINGS: NG tube tip is in the stomach.  Visualized lungs clear. IMPRESSION: NG tube in the stomach. Electronically Signed   By: Charlett Nose M.D.   On: 08/21/2023 01:45   CT Angio Chest/Abd/Pel for Dissection W and/or W/WO  Result Date: 08/20/2023 CLINICAL DATA:  Acute aortic syndrome (AAS) suspected.  Chest pain. EXAM: CT  ANGIOGRAPHY CHEST, ABDOMEN AND PELVIS TECHNIQUE: Non-contrast CT of the chest was initially obtained. Multidetector CT imaging through the chest, abdomen and pelvis was performed using the standard protocol during bolus administration of intravenous contrast. Multiplanar reconstructed images and MIPs were obtained and reviewed to evaluate the vascular anatomy. RADIATION DOSE REDUCTION: This exam was performed according to the departmental dose-optimization program which includes automated exposure control, adjustment of the mA and/or kV according to patient size and/or use of iterative reconstruction technique. CONTRAST:  OMNIPAQUE IOHEXOL 350 MG/ML SOLN COMPARISON:  08/24/2022 FINDINGS: CTA CHEST FINDINGS Cardiovascular: Heart is normal size. Aorta is normal caliber. No filling defects in the pulmonary arteries to suggest pulmonary emboli. Mediastinum/Nodes: No mediastinal, hilar, or axillary adenopathy.  Trachea and esophagus are unremarkable. Thyroid unremarkable. Lungs/Pleura: Lungs are clear. No focal airspace opacities or suspicious nodules. No effusions. Musculoskeletal: Chest wall soft tissues are unremarkable. No acute bony abnormality. Review of the MIP images confirms the above findings. CTA ABDOMEN AND PELVIS FINDINGS VASCULAR Aorta: Normal caliber aorta without aneurysm, dissection, vasculitis or significant stenosis. Scattered aortic atherosclerosis. Celiac: Patent without evidence of aneurysm, dissection, vasculitis or significant stenosis. SMA: Patent without evidence of aneurysm, dissection, vasculitis or significant stenosis. Renals: Both renal arteries are patent without evidence of aneurysm, dissection, vasculitis, fibromuscular dysplasia or significant stenosis. IMA: Patent without evidence of aneurysm, dissection, vasculitis or significant stenosis. Inflow: Patent without evidence of aneurysm, dissection, vasculitis or significant stenosis. Scattered calcifications. Veins: No obvious  venous abnormality within the limitations of this arterial phase study. Review of the MIP images confirms the above findings. NON-VASCULAR Hepatobiliary: Prior cholecystectomy. Stable small cyst in the anterior left hepatic lobe. There is biliary ductal dilatation with common bile duct measuring up to 11 mm compared to 10 mm previously, not significantly changed. This is most likely related to post cholecystectomy state. Pancreas: No focal abnormality or ductal dilatation. Spleen: No focal abnormality.  Normal size. Adrenals/Urinary Tract: No adrenal abnormality. No focal renal abnormality. No stones or hydronephrosis. Urinary bladder is unremarkable. Stomach/Bowel: Dilated fluid-filled small bowel loops into the pelvis. Distal small bowel is decompressed. Findings compatible with distal small bowel obstruction. Exact transition not visualized. Stomach and large bowel unremarkable. Appendix normal. Lymphatic: No adenopathy Reproductive: Uterus and adnexa unremarkable.  No mass. Other: No free fluid or free air. Musculoskeletal: No acute bony abnormality. Review of the MIP images confirms the above findings. IMPRESSION: No evidence of aortic aneurysm or dissection. No evidence of pulmonary embolus. No acute cardiopulmonary disease. Evidence of distal small bowel obstruction. Exact transition and cause not visualized. Intrahepatic and extrahepatic biliary ductal dilatation is stable since prior study likely related to post cholecystectomy state. Recommend correlation with LFTs. Electronically Signed   By: Charlett Nose M.D.   On: 08/20/2023 23:25   DG Chest Port 1 View  Result Date: 08/20/2023 CLINICAL DATA:  Chest pain. EXAM: PORTABLE CHEST 1 VIEW COMPARISON:  Chest radiograph dated 11/02/2022. FINDINGS: The heart size and mediastinal contours are within normal limits. Both lungs are clear. The visualized skeletal structures are unremarkable. IMPRESSION: No active disease. Electronically Signed   By: Romona Curls  M.D.   On: 08/20/2023 21:59     Labs:   Basic Metabolic Panel: Recent Labs  Lab 08/20/23 2120 08/22/23 0349 08/23/23 0346  NA 136 136 136  K 3.3* 3.3* 3.6  CL 99 100 100  CO2 26 27 28   GLUCOSE 135* 95 92  BUN 10 9 7   CREATININE 0.83 0.76 0.68  CALCIUM 10.1 8.8* 8.8*  MG  --  1.8 1.8  PHOS  --   --  3.4   GFR Estimated Creatinine Clearance: 86.9 mL/min (by C-G formula based on SCr of 0.68 mg/dL). Liver Function Tests: Recent Labs  Lab 08/20/23 2120 08/22/23 0349  AST 24 17  ALT 16 12  ALKPHOS 65 50  BILITOT 1.4* 1.5*  PROT 9.5* 7.3  ALBUMIN 4.9 3.8   Recent Labs  Lab 08/20/23 2120  LIPASE 40   No results for input(s): "AMMONIA" in the last 168 hours. Coagulation profile No results for input(s): "INR", "PROTIME" in the last 168 hours.  CBC: Recent Labs  Lab 08/20/23 2120 08/22/23 0349 08/23/23 0346  WBC 10.1 6.1 3.9*  NEUTROABS 8.2*  4.6 2.2  HGB 15.1* 11.7* 10.8*  HCT 43.3 34.9* 31.0*  MCV 94.3 95.9 94.5  PLT 174 131* 125*   Cardiac Enzymes: No results for input(s): "CKTOTAL", "CKMB", "CKMBINDEX", "TROPONINI" in the last 168 hours. BNP: Invalid input(s): "POCBNP" CBG: Recent Labs  Lab 08/20/23 2102  GLUCAP 95   D-Dimer No results for input(s): "DDIMER" in the last 72 hours. Hgb A1c No results for input(s): "HGBA1C" in the last 72 hours. Lipid Profile No results for input(s): "CHOL", "HDL", "LDLCALC", "TRIG", "CHOLHDL", "LDLDIRECT" in the last 72 hours. Thyroid function studies No results for input(s): "TSH", "T4TOTAL", "T3FREE", "THYROIDAB" in the last 72 hours.  Invalid input(s): "FREET3" Anemia work up No results for input(s): "VITAMINB12", "FOLATE", "FERRITIN", "TIBC", "IRON", "RETICCTPCT" in the last 72 hours. Microbiology No results found for this or any previous visit (from the past 240 hour(s)).  Time coordinating discharge: 45 minutes  Signed: Kamesha Herne  [facility] 08/24/2023, 6:10 PM

## 2023-08-24 NOTE — Progress Notes (Signed)
PROGRESS NOTE  Kim Fitzgerald  DOB: 1976-07-12  PCP: Jettie Pagan, NP QQV:[id number]  DOA: 08/20/2023  LOS: 3 days  Hospital Day: 5  Brief narrative: Kim Fitzgerald is a 47 y.o. female with PMH significant for undifferentiated connective tissue disease, Sjogren's disease, GERD, h/o H. pylori infection 8/31, patient presented to the ED with sudden onset sharp stabbing epigastric pain.  In the ED, hemodynamically stable Labs with lipase of 40, left is normal CT scan showed multiple dilated fluid-filled small bowel loops in the pelvis with decompressed distal bowel.  Findings suggestive of distal SBO without noted transition point EDP discussed with general surgery. NG tube was placed Admitted to [facility] for conservative management of SBO  Subjective: Patient was seen and examined this morning.   NG tube was removed.  Diet advanced to soft by general surgery.  Assessment and plan: Small bowel obstruction Presented with sudden onset abdominal pain.   CT abdomen findings as above.   She was started on conservative management with IV fluid, IV Protonix, IV antiemetics, pain meds and NG decompression  9/2, underwent abdominal x-ray showed oral contrast throughout the colon and rectum with no dilated large or small bowel.  Nonobstructive bowel gas pattern. NG tube removed.  Had a bowel movement this morning. Started on clear liquid diet and advanced to soft today by general surgery.  Continue monitor symptoms.  Hypokalemia In the setting of GI fluid loss from NG decompression Improved with replacement Recent Labs  Lab 08/20/23 2120 08/22/23 0349 08/23/23 0346  K 3.3* 3.3* 3.6  MG  --  1.8 1.8  PHOS  --   --  3.4   Insomnia Continue Ambien nightly   Sjogren's disease Undifferentiated connective tissue disease Currently methotrexate on hold   Anxiety/depression PTA meds-Cymbalta, Neurontin, trazodone, Ambien Currently on hold As needed Ativan for anxiety  control   Mobility: Encourage ambulation  Goals of care   Code Status: Full Code     DVT prophylaxis:  heparin injection 5,000 Units Start: 08/21/23 0715 SCDs Start: 08/21/23 3329   Antimicrobials: None Fluid: Can stop IV fluid today Consultants: General Surgery Family Communication: None at bedside  Status: Inpatient Level of care:  Med-Surg   Patient is from: Home Needs to continue in-hospital care: Ongoing management of bowel obstruction Anticipated d/c to: Home after surgical clearance either this afternoon or tomorrow      Diet:  Diet Order             DIET SOFT Room service appropriate? Yes; Fluid consistency: Thin  Diet effective now                   Scheduled Meds:  heparin  5,000 Units Subcutaneous Q8H   pantoprazole (PROTONIX) IV  40 mg Intravenous Q12H   traZODone  50 mg Oral QHS    PRN meds: acetaminophen, HYDROmorphone (DILAUDID) injection, LORazepam, ondansetron (ZOFRAN) IV, phenol   Infusions:     Antimicrobials: Anti-infectives (From admission, onward)    None       Nutritional status:  Body mass index is 25.87 kg/m.          Objective: Vitals:   08/24/23 0414 08/24/23 0616  BP: 94/64 98/68  Pulse: 78   Resp: 18   Temp: 98.2 F (36.8 C)   SpO2: 99%     Intake/Output Summary (Last 24 hours) at 08/24/2023 1424 Last data filed at 08/23/2023 2135 Gross per 24 hour  Intake 580 ml  Output --  Net 580 ml   Filed Weights   08/22/23 0421 08/23/23 0427 08/24/23 0500  Weight: 75.7 kg 74.9 kg 72.7 kg   Weight change: -2.2 kg Body mass index is 25.87 kg/m.   Physical Exam: General exam: Pleasant middle-aged African-American female.  NG tube attached to suction Skin: No rashes, lesions or ulcers. HEENT: Atraumatic, normocephalic, no obvious bleeding Lungs: Clear to auscultation bilaterally CVS: Regular rate and rhythm, no murmur GI/Abd soft, improving tenderness, bowel sound present CNS: Alert, awake monitor x  3 Psychiatry: Sad affect. Extremities: No pedal edema, no calf tenderness  Data Review: I have personally reviewed the laboratory data and studies available.  F/u labs ordered Unresulted Labs (From admission, onward)    None       Total time spent in review of labs and imaging, patient evaluation, formulation of plan, documentation and communication with family: 45 minutes  Signed, Lorin Glass, MD [facility] 08/24/2023
